# Patient Record
Sex: Female | Born: 1945 | Race: White | Hispanic: No | Marital: Married | Smoking: Never smoker
Health system: Southern US, Community
[De-identification: ages and names within clinical notes are randomized; demographics above are authoritative.]

## PROBLEM LIST (undated history)

## (undated) DIAGNOSIS — I10 Essential (primary) hypertension: Secondary | ICD-10-CM

## (undated) DIAGNOSIS — G4730 Sleep apnea, unspecified: Secondary | ICD-10-CM

## (undated) DIAGNOSIS — K929 Disease of digestive system, unspecified: Secondary | ICD-10-CM

## (undated) DIAGNOSIS — N390 Urinary tract infection, site not specified: Secondary | ICD-10-CM

## (undated) DIAGNOSIS — M1990 Unspecified osteoarthritis, unspecified site: Secondary | ICD-10-CM

## (undated) DIAGNOSIS — D689 Coagulation defect, unspecified: Secondary | ICD-10-CM

## (undated) DIAGNOSIS — K219 Gastro-esophageal reflux disease without esophagitis: Secondary | ICD-10-CM

## (undated) DIAGNOSIS — Z9889 Other specified postprocedural states: Secondary | ICD-10-CM

## (undated) DIAGNOSIS — E079 Disorder of thyroid, unspecified: Secondary | ICD-10-CM

## (undated) DIAGNOSIS — E039 Hypothyroidism, unspecified: Secondary | ICD-10-CM

## (undated) DIAGNOSIS — Z85820 Personal history of malignant melanoma of skin: Secondary | ICD-10-CM

## (undated) DIAGNOSIS — E785 Hyperlipidemia, unspecified: Secondary | ICD-10-CM

## (undated) DIAGNOSIS — J45909 Unspecified asthma, uncomplicated: Secondary | ICD-10-CM

## (undated) HISTORY — DX: Disorder of thyroid, unspecified: E07.9

## (undated) HISTORY — DX: Gastro-esophageal reflux disease without esophagitis: K21.9

## (undated) HISTORY — DX: Disease of digestive system, unspecified: K92.9

## (undated) HISTORY — DX: Hypothyroidism, unspecified: E03.9

## (undated) HISTORY — DX: Coagulation defect, unspecified: D68.9

## (undated) HISTORY — DX: Unspecified asthma, uncomplicated: J45.909

## (undated) HISTORY — DX: Sleep apnea, unspecified: G47.30

## (undated) HISTORY — DX: Hyperlipidemia, unspecified: E78.5

## (undated) HISTORY — DX: Essential (primary) hypertension: I10

## (undated) HISTORY — DX: Urinary tract infection, site not specified: N39.0

## (undated) HISTORY — PX: ABDOMINAL HYSTERECTOMY: SHX81

## (undated) HISTORY — DX: Unspecified osteoarthritis, unspecified site: M19.90

## (undated) HISTORY — DX: Other specified postprocedural states: Z98.890

## (undated) HISTORY — DX: Personal history of malignant melanoma of skin: Z85.820

---

## 2004-07-14 ENCOUNTER — Ambulatory Visit: Payer: Self-pay | Admitting: Internal Medicine

## 2005-07-22 ENCOUNTER — Ambulatory Visit: Payer: Self-pay | Admitting: Internal Medicine

## 2006-01-03 ENCOUNTER — Ambulatory Visit: Payer: Self-pay | Admitting: Internal Medicine

## 2006-07-28 ENCOUNTER — Ambulatory Visit: Payer: Self-pay | Admitting: Internal Medicine

## 2006-10-06 ENCOUNTER — Ambulatory Visit: Payer: Self-pay | Admitting: Family Medicine

## 2007-08-03 ENCOUNTER — Ambulatory Visit: Payer: Self-pay | Admitting: Internal Medicine

## 2008-09-11 ENCOUNTER — Ambulatory Visit: Payer: Self-pay | Admitting: Internal Medicine

## 2009-09-22 ENCOUNTER — Ambulatory Visit: Payer: Self-pay | Admitting: Internal Medicine

## 2010-09-23 ENCOUNTER — Ambulatory Visit: Payer: Self-pay

## 2011-01-26 DIAGNOSIS — I8310 Varicose veins of unspecified lower extremity with inflammation: Secondary | ICD-10-CM | POA: Insufficient documentation

## 2011-01-26 DIAGNOSIS — I8010 Phlebitis and thrombophlebitis of unspecified femoral vein: Secondary | ICD-10-CM | POA: Insufficient documentation

## 2011-02-05 ENCOUNTER — Ambulatory Visit: Payer: Self-pay | Admitting: Internal Medicine

## 2011-09-28 ENCOUNTER — Ambulatory Visit: Payer: Self-pay | Admitting: Internal Medicine

## 2011-09-28 LAB — CREATININE, SERUM
Creatinine: 0.68 mg/dL (ref 0.60–1.30)
EGFR (African American): >60

## 2011-09-28 LAB — BUN: BUN: 19 mg/dL — ABNORMAL HIGH (ref 7–18)

## 2011-10-26 ENCOUNTER — Ambulatory Visit: Payer: Self-pay | Admitting: Internal Medicine

## 2011-11-23 ENCOUNTER — Ambulatory Visit: Payer: Self-pay | Admitting: Internal Medicine

## 2012-01-28 ENCOUNTER — Ambulatory Visit: Payer: Self-pay | Admitting: Unknown Physician Specialty

## 2012-01-31 LAB — PATHOLOGY REPORT

## 2012-10-26 ENCOUNTER — Ambulatory Visit: Payer: Self-pay | Admitting: Internal Medicine

## 2012-12-26 ENCOUNTER — Ambulatory Visit: Payer: Self-pay | Admitting: Internal Medicine

## 2013-02-09 ENCOUNTER — Other Ambulatory Visit: Payer: Self-pay | Admitting: Physician Assistant

## 2013-02-09 LAB — CBC WITH DIFFERENTIAL/PLATELET
Basophil #: 0.1 10*3/uL (ref 0.0–0.1)
Eosinophil %: 1.8 %
HCT: 40.3 % (ref 35.0–47.0)
HGB: 14.2 g/dL (ref 12.0–16.0)
Lymphocyte %: 28 %
MCV: 89 fL (ref 80–100)
Monocyte #: 0.4 x10 3/mm (ref 0.2–0.9)
Monocyte %: 8.8 %
Neutrophil #: 3 10*3/uL (ref 1.4–6.5)
Platelet: 206 10*3/uL (ref 150–440)
RBC: 4.51 10*6/uL (ref 3.80–5.20)
RDW: 13 % (ref 11.5–14.5)

## 2013-02-09 LAB — COMPREHENSIVE METABOLIC PANEL
Alkaline Phosphatase: 133 U/L (ref 50–136)
Anion Gap: 5 — ABNORMAL LOW (ref 7–16)
BUN: 18 mg/dL (ref 7–18)
Chloride: 106 mmol/L (ref 98–107)
Co2: 30 mmol/L (ref 21–32)
Creatinine: 0.75 mg/dL (ref 0.60–1.30)
EGFR (Non-African Amer.): >60
Glucose: 88 mg/dL (ref 65–99)
SGOT(AST): 54 U/L — ABNORMAL HIGH (ref 15–37)
SGPT (ALT): 83 U/L — ABNORMAL HIGH (ref 12–78)
Sodium: 141 mmol/L (ref 136–145)

## 2013-02-09 LAB — LIPID PANEL
Cholesterol: 218 mg/dL — ABNORMAL HIGH (ref 0–200)
HDL Cholesterol: 65 mg/dL — ABNORMAL HIGH (ref 40–60)
Triglycerides: 114 mg/dL (ref 0–200)
VLDL Cholesterol, Calc: 23 mg/dL (ref 5–40)

## 2013-02-09 LAB — TSH: Thyroid Stimulating Horm: 2.24 u[IU]/mL

## 2013-02-26 ENCOUNTER — Ambulatory Visit: Payer: Self-pay | Admitting: Internal Medicine

## 2013-04-26 ENCOUNTER — Ambulatory Visit: Payer: Self-pay | Admitting: Internal Medicine

## 2013-11-20 ENCOUNTER — Ambulatory Visit: Payer: Self-pay | Admitting: Physician Assistant

## 2013-11-20 ENCOUNTER — Ambulatory Visit: Payer: Self-pay | Admitting: Internal Medicine

## 2014-11-27 ENCOUNTER — Ambulatory Visit
Admit: 2014-11-27 | Disposition: A | Discharge status: Home / Self Care | Payer: Self-pay | Attending: Internal Medicine | Admitting: Internal Medicine

## 2015-07-03 ENCOUNTER — Other Ambulatory Visit: Payer: Self-pay | Admitting: Internal Medicine

## 2015-07-07 ENCOUNTER — Other Ambulatory Visit: Payer: Self-pay | Admitting: Internal Medicine

## 2015-07-07 DIAGNOSIS — M810 Age-related osteoporosis without current pathological fracture: Secondary | ICD-10-CM

## 2015-07-07 DIAGNOSIS — E2839 Other primary ovarian failure: Secondary | ICD-10-CM

## 2015-07-17 ENCOUNTER — Ambulatory Visit: Payer: Self-pay | Attending: Internal Medicine

## 2015-08-19 ENCOUNTER — Ambulatory Visit
Admission: RE | Admit: 2015-08-19 | Discharge: 2015-08-19 | Disposition: A | Discharge status: Home / Self Care | Payer: PPO | Source: Ambulatory Visit | Attending: Internal Medicine | Admitting: Internal Medicine

## 2015-08-19 DIAGNOSIS — Z90710 Acquired absence of both cervix and uterus: Secondary | ICD-10-CM | POA: Insufficient documentation

## 2015-08-19 DIAGNOSIS — Z13820 Encounter for screening for osteoporosis: Secondary | ICD-10-CM | POA: Insufficient documentation

## 2015-08-19 DIAGNOSIS — M8580 Other specified disorders of bone density and structure, unspecified site: Secondary | ICD-10-CM | POA: Diagnosis not present

## 2015-08-19 DIAGNOSIS — E2839 Other primary ovarian failure: Secondary | ICD-10-CM

## 2015-08-19 DIAGNOSIS — M810 Age-related osteoporosis without current pathological fracture: Secondary | ICD-10-CM

## 2015-08-22 DIAGNOSIS — Z86718 Personal history of other venous thrombosis and embolism: Secondary | ICD-10-CM | POA: Diagnosis not present

## 2015-10-24 DIAGNOSIS — Z86718 Personal history of other venous thrombosis and embolism: Secondary | ICD-10-CM | POA: Diagnosis not present

## 2015-12-12 DIAGNOSIS — J0100 Acute maxillary sinusitis, unspecified: Secondary | ICD-10-CM | POA: Diagnosis not present

## 2015-12-12 DIAGNOSIS — J45991 Cough variant asthma: Secondary | ICD-10-CM | POA: Diagnosis not present

## 2015-12-23 DIAGNOSIS — Z7901 Long term (current) use of anticoagulants: Secondary | ICD-10-CM | POA: Diagnosis not present

## 2015-12-23 DIAGNOSIS — J019 Acute sinusitis, unspecified: Secondary | ICD-10-CM | POA: Diagnosis not present

## 2015-12-24 DIAGNOSIS — J019 Acute sinusitis, unspecified: Secondary | ICD-10-CM | POA: Diagnosis not present

## 2015-12-25 DIAGNOSIS — J309 Allergic rhinitis, unspecified: Secondary | ICD-10-CM | POA: Diagnosis not present

## 2015-12-25 DIAGNOSIS — Z7901 Long term (current) use of anticoagulants: Secondary | ICD-10-CM | POA: Diagnosis not present

## 2015-12-25 DIAGNOSIS — I80221 Phlebitis and thrombophlebitis of right popliteal vein: Secondary | ICD-10-CM | POA: Diagnosis not present

## 2015-12-25 DIAGNOSIS — J0100 Acute maxillary sinusitis, unspecified: Secondary | ICD-10-CM | POA: Diagnosis not present

## 2016-01-02 ENCOUNTER — Ambulatory Visit
Admission: RE | Admit: 2016-01-02 | Discharge: 2016-01-02 | Disposition: A | Discharge status: Home / Self Care | Payer: PPO | Source: Ambulatory Visit | Attending: Physician Assistant | Admitting: Physician Assistant

## 2016-01-02 ENCOUNTER — Other Ambulatory Visit: Payer: Self-pay | Admitting: Physician Assistant

## 2016-01-02 DIAGNOSIS — R05 Cough: Secondary | ICD-10-CM | POA: Insufficient documentation

## 2016-01-02 DIAGNOSIS — J019 Acute sinusitis, unspecified: Secondary | ICD-10-CM | POA: Diagnosis not present

## 2016-01-02 DIAGNOSIS — R059 Cough, unspecified: Secondary | ICD-10-CM

## 2016-01-06 DIAGNOSIS — J45991 Cough variant asthma: Secondary | ICD-10-CM | POA: Diagnosis not present

## 2016-01-06 DIAGNOSIS — J329 Chronic sinusitis, unspecified: Secondary | ICD-10-CM | POA: Diagnosis not present

## 2016-01-15 DIAGNOSIS — N390 Urinary tract infection, site not specified: Secondary | ICD-10-CM | POA: Diagnosis not present

## 2016-01-15 DIAGNOSIS — Z86718 Personal history of other venous thrombosis and embolism: Secondary | ICD-10-CM | POA: Diagnosis not present

## 2016-01-15 DIAGNOSIS — R1030 Lower abdominal pain, unspecified: Secondary | ICD-10-CM | POA: Diagnosis not present

## 2016-01-15 DIAGNOSIS — Z7901 Long term (current) use of anticoagulants: Secondary | ICD-10-CM | POA: Diagnosis not present

## 2016-01-15 DIAGNOSIS — R140 Abdominal distension (gaseous): Secondary | ICD-10-CM | POA: Diagnosis not present

## 2016-01-23 DIAGNOSIS — J323 Chronic sphenoidal sinusitis: Secondary | ICD-10-CM | POA: Diagnosis not present

## 2016-01-23 DIAGNOSIS — J301 Allergic rhinitis due to pollen: Secondary | ICD-10-CM | POA: Diagnosis not present

## 2016-01-23 DIAGNOSIS — J324 Chronic pansinusitis: Secondary | ICD-10-CM | POA: Diagnosis not present

## 2016-01-27 DIAGNOSIS — Z7901 Long term (current) use of anticoagulants: Secondary | ICD-10-CM | POA: Diagnosis not present

## 2016-01-27 DIAGNOSIS — N390 Urinary tract infection, site not specified: Secondary | ICD-10-CM | POA: Diagnosis not present

## 2016-01-27 DIAGNOSIS — I80221 Phlebitis and thrombophlebitis of right popliteal vein: Secondary | ICD-10-CM | POA: Diagnosis not present

## 2016-02-03 DIAGNOSIS — J329 Chronic sinusitis, unspecified: Secondary | ICD-10-CM | POA: Insufficient documentation

## 2016-02-03 DIAGNOSIS — J40 Bronchitis, not specified as acute or chronic: Secondary | ICD-10-CM

## 2016-05-12 DIAGNOSIS — Z86718 Personal history of other venous thrombosis and embolism: Secondary | ICD-10-CM | POA: Diagnosis not present

## 2016-05-12 DIAGNOSIS — N390 Urinary tract infection, site not specified: Secondary | ICD-10-CM | POA: Diagnosis not present

## 2016-05-12 DIAGNOSIS — Z7901 Long term (current) use of anticoagulants: Secondary | ICD-10-CM | POA: Diagnosis not present

## 2016-05-12 DIAGNOSIS — N952 Postmenopausal atrophic vaginitis: Secondary | ICD-10-CM | POA: Diagnosis not present

## 2016-05-14 DIAGNOSIS — E559 Vitamin D deficiency, unspecified: Secondary | ICD-10-CM | POA: Diagnosis not present

## 2016-05-14 DIAGNOSIS — E782 Mixed hyperlipidemia: Secondary | ICD-10-CM | POA: Diagnosis not present

## 2016-05-14 DIAGNOSIS — Z0001 Encounter for general adult medical examination with abnormal findings: Secondary | ICD-10-CM | POA: Diagnosis not present

## 2016-05-18 DIAGNOSIS — Z7901 Long term (current) use of anticoagulants: Secondary | ICD-10-CM | POA: Diagnosis not present

## 2016-05-18 DIAGNOSIS — Z86718 Personal history of other venous thrombosis and embolism: Secondary | ICD-10-CM | POA: Diagnosis not present

## 2016-05-31 DIAGNOSIS — N390 Urinary tract infection, site not specified: Secondary | ICD-10-CM | POA: Diagnosis not present

## 2016-05-31 DIAGNOSIS — Z23 Encounter for immunization: Secondary | ICD-10-CM | POA: Diagnosis not present

## 2016-05-31 DIAGNOSIS — Z7901 Long term (current) use of anticoagulants: Secondary | ICD-10-CM | POA: Diagnosis not present

## 2016-05-31 DIAGNOSIS — E782 Mixed hyperlipidemia: Secondary | ICD-10-CM | POA: Diagnosis not present

## 2016-05-31 DIAGNOSIS — Z86718 Personal history of other venous thrombosis and embolism: Secondary | ICD-10-CM | POA: Diagnosis not present

## 2016-06-14 DIAGNOSIS — Z86718 Personal history of other venous thrombosis and embolism: Secondary | ICD-10-CM | POA: Diagnosis not present

## 2016-06-14 DIAGNOSIS — Z7901 Long term (current) use of anticoagulants: Secondary | ICD-10-CM | POA: Diagnosis not present

## 2016-06-22 DIAGNOSIS — N390 Urinary tract infection, site not specified: Secondary | ICD-10-CM | POA: Diagnosis not present

## 2016-06-22 DIAGNOSIS — Z7901 Long term (current) use of anticoagulants: Secondary | ICD-10-CM | POA: Diagnosis not present

## 2016-07-01 ENCOUNTER — Ambulatory Visit: Payer: PPO | Admitting: Urology

## 2016-07-01 ENCOUNTER — Ambulatory Visit
Admission: RE | Admit: 2016-07-01 | Discharge: 2016-07-01 | Disposition: A | Discharge status: Home / Self Care | Payer: PPO | Source: Ambulatory Visit | Attending: Urology | Admitting: Urology

## 2016-07-01 ENCOUNTER — Encounter: Payer: Self-pay | Admitting: Urology

## 2016-07-01 ENCOUNTER — Telehealth: Payer: Self-pay

## 2016-07-01 VITALS — BP 118/80 | HR 61 | Ht 62.5 in | Wt 151.4 lb

## 2016-07-01 DIAGNOSIS — N390 Urinary tract infection, site not specified: Secondary | ICD-10-CM | POA: Insufficient documentation

## 2016-07-01 NOTE — Telephone Encounter (Signed)
Patient notified/SW 

## 2016-07-01 NOTE — Telephone Encounter (Signed)
-----   Message from Hollice Espy, MD sent at 07/01/2016  4:10 PM EST ----- No stones, great news.    Hollice Espy, MD

## 2016-07-01 NOTE — Progress Notes (Signed)
07/01/2016 7:49 PM   Marissa Wilson [birth year] [id number]  Referring provider: Lavera Guise, MD [address] [place], [place] [address]  Chief Complaint  Patient presents with  . New Patient (Initial Visit)    Recurrent UTI    HPI: 70 year old female referred today by Dr. Humphrey Rolls for further evaluation of recurrent Urinary tract infections.  Urine culture data reveals positive urine culture on 05/2016 growing Klebsiella and E.coli resistant to ampicillin, piperacillin, and bactrim.  UCx 04/2016 grew E. Coli with similar resistance pattern.  UCx 01/2016 grew low colony count Enterococcus/ Klebsiella 10-25K.    She usually has about 1-2 per year prior to this.    She just started on compounded topical estrogen cream.    No associated fevers or flank pain.    Personal history of C. Diff with antibiotics.  She also takes a good amount amount  abx for chronic sinusitis.   She does use prototic.    She is a personal history of DVT on Coumadin.  Her first blood clot was at clot at age 30.  She feels that these are related estrogen.  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    Post menopausal.  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  She does has vaginal dryness without dysparunia.  No association with with sexual intercourse.  She does not duche.  She does not also wipe from front to back.    No issues with constipation.    No personal history of kidney stones.    No urinary urgency frequency or incontinence.     PMH: Past Medical History:  Diagnosis Date  . Asthma   . GERD (gastroesophageal reflux disease)   . Thyroid disease     Surgical History: Past Surgical History:  Procedure Laterality Date  . ABDOMINAL HYSTERECTOMY      Home Medications:    Medication List       Accurate as of 07/01/16 11:59 PM. Always use your most recent med list.          levothyroxine 75 MCG tablet Commonly known as:  SYNTHROID, LEVOTHROID Take by mouth.     montelukast 10 MG tablet Commonly known as:  SINGULAIR   omeprazole 40 MG capsule Commonly known as:  PRILOSEC Take 40 mg by mouth.   triamterene-hydrochlorothiazide 37.5-25 MG tablet Commonly known as:  MAXZIDE-25 Take by mouth.   Vitamin D3 1000 units Caps Take by mouth.   warfarin 5 MG tablet Commonly known as:  COUMADIN 5 mg.       Allergies:  Allergies  Allergen Reactions  . Ampicillin Diarrhea  . Cefazolin Diarrhea    Family History: Family History  Problem Relation Age of Onset  . Bladder Cancer Neg Hx   . Kidney cancer Neg Hx     Social History:  reports that she has never smoked. She has never used smokeless tobacco. She reports that she drinks alcohol. She reports that she does not use drugs.  ROS: UROLOGY Frequent Urination?: No Hard to postpone urination?: No Burning/pain with urination?: Yes Get up at night to urinate?: Yes Leakage of urine?: No Urine stream starts and stops?: No Trouble starting stream?: No Do you have to strain to urinate?: No Blood in urine?: No Urinary tract infection?: Yes Sexually transmitted disease?: No Injury to kidneys or bladder?: No Painful intercourse?: No Weak stream?: No Currently pregnant?: No Vaginal bleeding?: No Last menstrual period?: n  Gastrointestinal Nausea?: No Vomiting?: No Indigestion/heartburn?: Yes  Diarrhea?: No Constipation?: No  Constitutional Fever: No Night sweats?: No Weight loss?: No Fatigue?: No  Skin Skin rash/lesions?: No Itching?: No  Eyes Blurred vision?: No Double vision?: No  Ears/Nose/Throat Sore throat?: No Sinus problems?: Yes  Hematologic/Lymphatic Swollen glands?: No Easy bruising?: No  Cardiovascular Leg swelling?: Yes Chest pain?: No  Respiratory Cough?: No Shortness of breath?: No  Endocrine Excessive thirst?: No  Musculoskeletal Back pain?: No Joint pain?: No  Neurological Headaches?: No Dizziness?: No  Psychologic Depression?:  No Anxiety?: No  Physical Exam: BP 118/80 (BP Location: Left Arm, Patient Position: Sitting, Cuff Size: Normal)   Pulse 61   Ht 5' 2.5" (1.588 m)   Wt 151 lb 6.4 oz (68.7 kg)   BMI 27.25 kg/m   Constitutional:  Alert and oriented, No acute distress. HEENT: [place] AT, moist mucus membranes.  Trachea midline, no masses. Cardiovascular: No clubbing, cyanosis, or edema. Respiratory: Normal respiratory effort, no increased work of breathing. GI: Abdomen is soft, nontender, nondistended, no abdominal masses GU: No CVA tenderness.  Skin: No rashes, bruises or suspicious lesions. Neurologic: Grossly intact, no focal deficits, moving all 4 extremities. Psychiatric: Normal mood and affect.  Laboratory Data: Lab Results  Component Value Date   WBC 5.1 02/09/2013   HGB 14.2 02/09/2013   HCT 40.3 02/09/2013   MCV 89 02/09/2013   PLT 206 02/09/2013    Lab Results  Component Value Date   CREATININE 0.75 02/09/2013    Urinalysis  Currently asymptomatic, UA defered  Pertinent Imaging: CLINICAL DATA:  Recurrent UTI  EXAM: ABDOMEN - 1 VIEW  COMPARISON:  CT abdomen pelvis 09/28/2011  FINDINGS: The bowel gas pattern is normal. No radio-opaque calculi or other significant radiographic abnormality are seen.  IMPRESSION: Negative.   Electronically Signed   By: Franchot Gallo M.D.   On: 07/01/2016 14:36    KUB personally reviewed today  Assessment & Plan:    1. Recurrent UTI No clear etiology for infection, KUB negative for stones Following recs made: Cranberry tablets twice per day Probiotic or yogurt with live active culture daily Topical estogen cream M-W-Fr before bed, pea sized amount on the urethra.  Discussed minimal risk of systemic absorption although there is up to 5%, much lower than HRT.    Plenty of water Hygiene issues discussed Advised to follow up in our clinic when symptomatic in order to collect UCx data and frequency of infection Prefer to avoid  suppressive abx in setting of hx C. Diff and antibiotic stewardship No indication for cysto at this point but may consider in future  All questions answered  - DG Abd 1 View; Future    F/u prn  Hollice Espy, MD  [facility] [address] [place], [place] [address] [phone]

## 2016-07-01 NOTE — Patient Instructions (Addendum)
Cranberry tablets twice per day  Probiotic or yogurt with live active culture daily  Topical estogen cream M-W-Fr before bed, pea sized amount on the urethra   Plenty of water  Hygiene issues discussed

## 2016-08-02 ENCOUNTER — Other Ambulatory Visit: Payer: Self-pay | Admitting: Internal Medicine

## 2016-08-02 DIAGNOSIS — Z1231 Encounter for screening mammogram for malignant neoplasm of breast: Secondary | ICD-10-CM

## 2016-08-02 DIAGNOSIS — I8311 Varicose veins of right lower extremity with inflammation: Secondary | ICD-10-CM | POA: Diagnosis not present

## 2016-08-02 DIAGNOSIS — Z7901 Long term (current) use of anticoagulants: Secondary | ICD-10-CM | POA: Diagnosis not present

## 2016-08-02 DIAGNOSIS — E782 Mixed hyperlipidemia: Secondary | ICD-10-CM | POA: Diagnosis not present

## 2016-08-02 DIAGNOSIS — I10 Essential (primary) hypertension: Secondary | ICD-10-CM | POA: Diagnosis not present

## 2016-08-02 DIAGNOSIS — E039 Hypothyroidism, unspecified: Secondary | ICD-10-CM | POA: Diagnosis not present

## 2016-08-02 DIAGNOSIS — I80221 Phlebitis and thrombophlebitis of right popliteal vein: Secondary | ICD-10-CM | POA: Diagnosis not present

## 2016-08-02 DIAGNOSIS — Z0001 Encounter for general adult medical examination with abnormal findings: Secondary | ICD-10-CM | POA: Diagnosis not present

## 2016-09-14 ENCOUNTER — Ambulatory Visit: Payer: PPO

## 2016-10-13 DIAGNOSIS — Z7901 Long term (current) use of anticoagulants: Secondary | ICD-10-CM | POA: Diagnosis not present

## 2016-10-18 ENCOUNTER — Ambulatory Visit
Admission: RE | Admit: 2016-10-18 | Discharge: 2016-10-18 | Disposition: A | Discharge status: Home / Self Care | Payer: PPO | Source: Ambulatory Visit | Attending: Internal Medicine | Admitting: Internal Medicine

## 2016-10-18 DIAGNOSIS — Z1231 Encounter for screening mammogram for malignant neoplasm of breast: Secondary | ICD-10-CM

## 2016-10-21 ENCOUNTER — Other Ambulatory Visit: Payer: Self-pay | Admitting: Internal Medicine

## 2016-10-21 ENCOUNTER — Ambulatory Visit: Payer: PPO

## 2016-10-21 DIAGNOSIS — R928 Other abnormal and inconclusive findings on diagnostic imaging of breast: Secondary | ICD-10-CM

## 2016-10-25 DIAGNOSIS — R1030 Lower abdominal pain, unspecified: Secondary | ICD-10-CM | POA: Diagnosis not present

## 2016-10-25 DIAGNOSIS — Z86718 Personal history of other venous thrombosis and embolism: Secondary | ICD-10-CM | POA: Diagnosis not present

## 2016-10-25 DIAGNOSIS — R140 Abdominal distension (gaseous): Secondary | ICD-10-CM | POA: Diagnosis not present

## 2016-10-25 DIAGNOSIS — N390 Urinary tract infection, site not specified: Secondary | ICD-10-CM | POA: Diagnosis not present

## 2016-10-25 DIAGNOSIS — Z7901 Long term (current) use of anticoagulants: Secondary | ICD-10-CM | POA: Diagnosis not present

## 2016-10-28 ENCOUNTER — Ambulatory Visit
Admission: RE | Admit: 2016-10-28 | Discharge: 2016-10-28 | Disposition: A | Discharge status: Home / Self Care | Payer: PPO | Source: Ambulatory Visit | Attending: Internal Medicine | Admitting: Internal Medicine

## 2016-10-28 DIAGNOSIS — R928 Other abnormal and inconclusive findings on diagnostic imaging of breast: Secondary | ICD-10-CM

## 2016-10-28 DIAGNOSIS — N6489 Other specified disorders of breast: Secondary | ICD-10-CM | POA: Diagnosis not present

## 2016-11-01 ENCOUNTER — Other Ambulatory Visit: Payer: Self-pay | Admitting: Internal Medicine

## 2016-11-01 ENCOUNTER — Ambulatory Visit
Admission: RE | Admit: 2016-11-01 | Discharge: 2016-11-01 | Disposition: A | Discharge status: Home / Self Care | Payer: PPO | Source: Ambulatory Visit | Attending: Internal Medicine | Admitting: Internal Medicine

## 2016-11-01 DIAGNOSIS — M79604 Pain in right leg: Secondary | ICD-10-CM | POA: Diagnosis not present

## 2016-11-01 DIAGNOSIS — M79605 Pain in left leg: Secondary | ICD-10-CM | POA: Diagnosis not present

## 2016-11-01 DIAGNOSIS — Z7901 Long term (current) use of anticoagulants: Secondary | ICD-10-CM | POA: Diagnosis not present

## 2016-11-01 DIAGNOSIS — I82811 Embolism and thrombosis of superficial veins of right lower extremities: Secondary | ICD-10-CM | POA: Insufficient documentation

## 2016-11-03 DIAGNOSIS — Z7901 Long term (current) use of anticoagulants: Secondary | ICD-10-CM | POA: Diagnosis not present

## 2016-11-04 DIAGNOSIS — B372 Candidiasis of skin and nail: Secondary | ICD-10-CM | POA: Diagnosis not present

## 2016-11-04 DIAGNOSIS — E039 Hypothyroidism, unspecified: Secondary | ICD-10-CM | POA: Diagnosis not present

## 2016-11-04 DIAGNOSIS — I8311 Varicose veins of right lower extremity with inflammation: Secondary | ICD-10-CM | POA: Diagnosis not present

## 2016-11-04 DIAGNOSIS — Z86718 Personal history of other venous thrombosis and embolism: Secondary | ICD-10-CM | POA: Diagnosis not present

## 2016-11-04 DIAGNOSIS — I80221 Phlebitis and thrombophlebitis of right popliteal vein: Secondary | ICD-10-CM | POA: Diagnosis not present

## 2016-11-04 DIAGNOSIS — R1030 Lower abdominal pain, unspecified: Secondary | ICD-10-CM | POA: Diagnosis not present

## 2016-11-09 DIAGNOSIS — I872 Venous insufficiency (chronic) (peripheral): Secondary | ICD-10-CM | POA: Diagnosis not present

## 2016-11-09 DIAGNOSIS — I8310 Varicose veins of unspecified lower extremity with inflammation: Secondary | ICD-10-CM | POA: Diagnosis not present

## 2016-11-09 DIAGNOSIS — I8010 Phlebitis and thrombophlebitis of unspecified femoral vein: Secondary | ICD-10-CM | POA: Diagnosis not present

## 2016-11-19 DIAGNOSIS — Z7901 Long term (current) use of anticoagulants: Secondary | ICD-10-CM | POA: Diagnosis not present

## 2016-11-22 DIAGNOSIS — I809 Phlebitis and thrombophlebitis of unspecified site: Secondary | ICD-10-CM | POA: Diagnosis not present

## 2016-11-29 DIAGNOSIS — Z7901 Long term (current) use of anticoagulants: Secondary | ICD-10-CM | POA: Diagnosis not present

## 2016-11-29 DIAGNOSIS — H25813 Combined forms of age-related cataract, bilateral: Secondary | ICD-10-CM | POA: Diagnosis not present

## 2016-12-20 DIAGNOSIS — Z7901 Long term (current) use of anticoagulants: Secondary | ICD-10-CM | POA: Diagnosis not present

## 2017-01-17 DIAGNOSIS — Z7901 Long term (current) use of anticoagulants: Secondary | ICD-10-CM | POA: Diagnosis not present

## 2017-01-27 DIAGNOSIS — I809 Phlebitis and thrombophlebitis of unspecified site: Secondary | ICD-10-CM | POA: Diagnosis not present

## 2017-01-27 DIAGNOSIS — I10 Essential (primary) hypertension: Secondary | ICD-10-CM | POA: Diagnosis not present

## 2017-01-27 DIAGNOSIS — Z86718 Personal history of other venous thrombosis and embolism: Secondary | ICD-10-CM | POA: Diagnosis not present

## 2017-01-27 DIAGNOSIS — Z881 Allergy status to other antibiotic agents status: Secondary | ICD-10-CM | POA: Diagnosis not present

## 2017-01-27 DIAGNOSIS — Z880 Allergy status to penicillin: Secondary | ICD-10-CM | POA: Diagnosis not present

## 2017-01-27 DIAGNOSIS — K219 Gastro-esophageal reflux disease without esophagitis: Secondary | ICD-10-CM | POA: Diagnosis not present

## 2017-01-27 DIAGNOSIS — Z90710 Acquired absence of both cervix and uterus: Secondary | ICD-10-CM | POA: Diagnosis not present

## 2017-01-27 DIAGNOSIS — Z7901 Long term (current) use of anticoagulants: Secondary | ICD-10-CM | POA: Diagnosis not present

## 2017-01-27 DIAGNOSIS — I8000 Phlebitis and thrombophlebitis of superficial vessels of unspecified lower extremity: Secondary | ICD-10-CM | POA: Diagnosis not present

## 2017-01-27 DIAGNOSIS — I8393 Asymptomatic varicose veins of bilateral lower extremities: Secondary | ICD-10-CM | POA: Diagnosis not present

## 2017-01-27 DIAGNOSIS — Z79899 Other long term (current) drug therapy: Secondary | ICD-10-CM | POA: Diagnosis not present

## 2017-01-27 DIAGNOSIS — E039 Hypothyroidism, unspecified: Secondary | ICD-10-CM | POA: Diagnosis not present

## 2017-02-03 DIAGNOSIS — I10 Essential (primary) hypertension: Secondary | ICD-10-CM | POA: Diagnosis not present

## 2017-02-03 DIAGNOSIS — E782 Mixed hyperlipidemia: Secondary | ICD-10-CM | POA: Diagnosis not present

## 2017-02-03 DIAGNOSIS — K219 Gastro-esophageal reflux disease without esophagitis: Secondary | ICD-10-CM | POA: Diagnosis not present

## 2017-02-03 DIAGNOSIS — E039 Hypothyroidism, unspecified: Secondary | ICD-10-CM | POA: Diagnosis not present

## 2017-02-03 DIAGNOSIS — Z86718 Personal history of other venous thrombosis and embolism: Secondary | ICD-10-CM | POA: Diagnosis not present

## 2017-02-03 DIAGNOSIS — I739 Peripheral vascular disease, unspecified: Secondary | ICD-10-CM | POA: Diagnosis not present

## 2017-02-25 DIAGNOSIS — E039 Hypothyroidism, unspecified: Secondary | ICD-10-CM | POA: Diagnosis not present

## 2017-02-25 DIAGNOSIS — R390 Extravasation of urine: Secondary | ICD-10-CM | POA: Diagnosis not present

## 2017-02-25 DIAGNOSIS — N390 Urinary tract infection, site not specified: Secondary | ICD-10-CM | POA: Diagnosis not present

## 2017-02-25 DIAGNOSIS — K219 Gastro-esophageal reflux disease without esophagitis: Secondary | ICD-10-CM | POA: Diagnosis not present

## 2017-02-25 DIAGNOSIS — I80292 Phlebitis and thrombophlebitis of other deep vessels of left lower extremity: Secondary | ICD-10-CM | POA: Diagnosis not present

## 2017-03-29 ENCOUNTER — Encounter: Payer: Self-pay | Admitting: Gastroenterology

## 2017-03-29 ENCOUNTER — Ambulatory Visit (INDEPENDENT_AMBULATORY_CARE_PROVIDER_SITE_OTHER): Payer: PPO | Admitting: Gastroenterology

## 2017-03-29 VITALS — BP 120/70 | HR 80 | Temp 97.9°F | Ht 62.5 in | Wt 156.6 lb

## 2017-03-29 DIAGNOSIS — K219 Gastro-esophageal reflux disease without esophagitis: Secondary | ICD-10-CM

## 2017-03-29 NOTE — Progress Notes (Signed)
Gastroenterology Consultation  Referring Provider:     Lavera Guise, MD Primary Care Physician:  Lavera Guise, MD Primary Gastroenterologist:  Dr. Allen Norris     Reason for Consultation:     Heartburn        HPI:   Marissa Wilson is a 71 y.o. y/o female referred for consultation & management of Heartburn by Dr. Humphrey Rolls, Timoteo Gaul, MD.  This patient comes here today with a history of heartburn.  The patient states she has had heartburn for prostate 5 years.  The patient states that her heartburn is well controlled with her omeprazole 40 mg.  The patient denies any dysphagia or unexplained weight loss.  There is no report of any food getting stuck when she tries to eat.  She does report that she has acid breakthrough approxi-once every 3 months and she can attributed to things she eats even every time.  She states that it is worse with spicy foods and alcohol.  She will take a Tums when she has no symptoms.  She states that it works in relieving her symptoms.  There is no history of colon cancer or colon polyps in the patient had a colonoscopy back in 2013 that she reports her been normal.  Past Medical History:  Diagnosis Date  . Asthma   . GERD (gastroesophageal reflux disease)   . Thyroid disease     Past Surgical History:  Procedure Laterality Date  . ABDOMINAL HYSTERECTOMY      Prior to Admission medications   Medication Sig Start Date End Date Taking? Authorizing Provider  Cholecalciferol (VITAMIN D3) 1000 units CAPS Take by mouth.    [provider]  levothyroxine (SYNTHROID, LEVOTHROID) 75 MCG tablet Take by mouth. 01/26/11   [provider]  montelukast (SINGULAIR) 10 MG tablet  06/14/16   [provider]  omeprazole (PRILOSEC) 40 MG capsule Take 40 mg by mouth. 10/03/14   [provider]  triamterene-hydrochlorothiazide (MAXZIDE-25) 37.5-25 MG tablet Take by mouth. 01/26/11   [provider]  XARELTO 10 MG TABS tablet  03/26/17   [provider]    Family History  Problem Relation Age of Onset  . Breast cancer Mother 4  . Bladder Cancer Neg Hx   . Kidney cancer Neg Hx      Social History  Substance Use Topics  . Smoking status: Never Smoker  . Smokeless tobacco: Never Used  . Alcohol use Yes    Allergies as of 03/29/2017 - Review Complete 07/06/2016  Allergen Reaction Noted  . Amoxicillin  01/27/2017  . Ampicillin Diarrhea 10/14/2014  . Cefazolin Diarrhea 06/11/2011  . Penicillins  06/11/2011    Review of Systems:    All systems reviewed and negative except where noted in HPI.   Physical Exam:  BP 120/70   Pulse 80   Temp 97.9 F (36.6 C) (Oral)   Ht 5' 2.5" (1.588 m)   Wt 156 lb 9.6 oz (71 kg)   BMI 28.19 kg/m  No LMP recorded. Patient has had a hysterectomy. Psych:  Alert and cooperative. Normal mood and affect. General:   Alert,  Well-developed, well-nourished, pleasant and cooperative in NAD Head:  Normocephalic and atraumatic. Eyes:  Sclera clear, no icterus.   Conjunctiva pink. Ears:  Normal auditory acuity. Nose:  No deformity, discharge, or lesions. Mouth:  No deformity or lesions,oropharynx pink & moist. Neck:  Supple; no masses or thyromegaly. Lungs:  Respirations even and unlabored.  Clear throughout  to auscultation.   No wheezes, crackles, or rhonchi. No acute distress. Heart:  Regular rate and rhythm; no murmurs, clicks, rubs, or gallops. Abdomen:  Normal bowel sounds.  No bruits.  Soft, non-tender and non-distended without masses, hepatosplenomegaly or hernias noted.  No guarding or rebound tenderness.  Negative Carnett sign.   Rectal:  Deferred.  Msk:  Symmetrical without gross deformities.  Good, equal movement & strength bilaterally. Pulses:  Normal pulses noted. Extremities:  No clubbing or edema.  No cyanosis. Neurologic:  Alert and oriented x3;  grossly normal neurologically. Skin:  Intact without significant lesions or rashes.  No jaundice. Lymph Nodes:  No  significant cervical adenopathy. Psych:  Alert and cooperative. Normal mood and affect.  Imaging Studies: No results found.  Assessment and Plan:   Marissa Wilson is a 71 y.o. y/o female who comes here today with a history of heartburn with good response to her present medication of omeprazole 40 mg a day.  The patient has been told she can try to decrease to 20 mg a day and see if her symptoms are still under control.  If they are she has been told to contact us to get a prescription for 20 mg of omeprazole every day.  If he does not control her symptoms she should stay on the omeprazole 40 mg.  The patient has no worrisome symptoms nor she having significant acid breakthrough to require her to undergo any endoscopic procedure.  The patient has been explained the plan and will follow up as necessary.  Lucilla Lame, MD. [name]   Note: This dictation was prepared with Dragon dictation along with smaller phrase technology. Any transcriptional errors that result from this process are unintentional.

## 2017-04-04 DIAGNOSIS — Z7901 Long term (current) use of anticoagulants: Secondary | ICD-10-CM | POA: Diagnosis not present

## 2017-04-04 DIAGNOSIS — I8312 Varicose veins of left lower extremity with inflammation: Secondary | ICD-10-CM | POA: Diagnosis not present

## 2017-04-04 DIAGNOSIS — I809 Phlebitis and thrombophlebitis of unspecified site: Secondary | ICD-10-CM | POA: Diagnosis not present

## 2017-04-04 DIAGNOSIS — I8311 Varicose veins of right lower extremity with inflammation: Secondary | ICD-10-CM | POA: Diagnosis not present

## 2017-04-04 DIAGNOSIS — I83813 Varicose veins of bilateral lower extremities with pain: Secondary | ICD-10-CM | POA: Diagnosis not present

## 2017-04-04 DIAGNOSIS — I8310 Varicose veins of unspecified lower extremity with inflammation: Secondary | ICD-10-CM | POA: Diagnosis not present

## 2017-04-04 DIAGNOSIS — Z86718 Personal history of other venous thrombosis and embolism: Secondary | ICD-10-CM | POA: Diagnosis not present

## 2017-05-02 ENCOUNTER — Encounter: Payer: Self-pay | Admitting: Urology

## 2017-06-06 DIAGNOSIS — E039 Hypothyroidism, unspecified: Secondary | ICD-10-CM | POA: Diagnosis not present

## 2017-06-06 DIAGNOSIS — Z86718 Personal history of other venous thrombosis and embolism: Secondary | ICD-10-CM | POA: Diagnosis not present

## 2017-06-06 DIAGNOSIS — I8311 Varicose veins of right lower extremity with inflammation: Secondary | ICD-10-CM | POA: Diagnosis not present

## 2017-06-06 DIAGNOSIS — Z880 Allergy status to penicillin: Secondary | ICD-10-CM | POA: Diagnosis not present

## 2017-06-06 DIAGNOSIS — Z01818 Encounter for other preprocedural examination: Secondary | ICD-10-CM | POA: Diagnosis not present

## 2017-06-06 DIAGNOSIS — I10 Essential (primary) hypertension: Secondary | ICD-10-CM | POA: Diagnosis not present

## 2017-06-09 DIAGNOSIS — I10 Essential (primary) hypertension: Secondary | ICD-10-CM | POA: Diagnosis not present

## 2017-06-09 DIAGNOSIS — Z886 Allergy status to analgesic agent status: Secondary | ICD-10-CM | POA: Diagnosis not present

## 2017-06-09 DIAGNOSIS — Z7901 Long term (current) use of anticoagulants: Secondary | ICD-10-CM | POA: Diagnosis not present

## 2017-06-09 DIAGNOSIS — Z880 Allergy status to penicillin: Secondary | ICD-10-CM | POA: Diagnosis not present

## 2017-06-09 DIAGNOSIS — I83891 Varicose veins of right lower extremities with other complications: Secondary | ICD-10-CM | POA: Diagnosis not present

## 2017-06-09 DIAGNOSIS — I872 Venous insufficiency (chronic) (peripheral): Secondary | ICD-10-CM | POA: Diagnosis not present

## 2017-06-09 DIAGNOSIS — E039 Hypothyroidism, unspecified: Secondary | ICD-10-CM | POA: Diagnosis not present

## 2017-06-09 DIAGNOSIS — K219 Gastro-esophageal reflux disease without esophagitis: Secondary | ICD-10-CM | POA: Diagnosis not present

## 2017-06-09 DIAGNOSIS — I8003 Phlebitis and thrombophlebitis of superficial vessels of lower extremities, bilateral: Secondary | ICD-10-CM | POA: Diagnosis not present

## 2017-06-09 DIAGNOSIS — Z79899 Other long term (current) drug therapy: Secondary | ICD-10-CM | POA: Diagnosis not present

## 2017-06-09 DIAGNOSIS — J309 Allergic rhinitis, unspecified: Secondary | ICD-10-CM | POA: Diagnosis not present

## 2017-06-09 DIAGNOSIS — I8311 Varicose veins of right lower extremity with inflammation: Secondary | ICD-10-CM | POA: Diagnosis not present

## 2017-06-09 HISTORY — PX: OTHER SURGICAL HISTORY: SHX169

## 2017-06-14 DIAGNOSIS — R269 Unspecified abnormalities of gait and mobility: Secondary | ICD-10-CM | POA: Diagnosis not present

## 2017-06-14 DIAGNOSIS — Z7901 Long term (current) use of anticoagulants: Secondary | ICD-10-CM | POA: Diagnosis not present

## 2017-06-14 DIAGNOSIS — M79661 Pain in right lower leg: Secondary | ICD-10-CM | POA: Diagnosis not present

## 2017-06-14 DIAGNOSIS — Z86718 Personal history of other venous thrombosis and embolism: Secondary | ICD-10-CM | POA: Diagnosis not present

## 2017-06-14 DIAGNOSIS — I871 Compression of vein: Secondary | ICD-10-CM | POA: Diagnosis not present

## 2017-06-14 DIAGNOSIS — I878 Other specified disorders of veins: Secondary | ICD-10-CM | POA: Diagnosis not present

## 2017-06-14 DIAGNOSIS — L7631 Postprocedural hematoma of skin and subcutaneous tissue following a dermatologic procedure: Secondary | ICD-10-CM | POA: Diagnosis not present

## 2017-06-14 DIAGNOSIS — I10 Essential (primary) hypertension: Secondary | ICD-10-CM | POA: Diagnosis not present

## 2017-06-14 DIAGNOSIS — I82431 Acute embolism and thrombosis of right popliteal vein: Secondary | ICD-10-CM | POA: Diagnosis not present

## 2017-06-14 DIAGNOSIS — K219 Gastro-esophageal reflux disease without esophagitis: Secondary | ICD-10-CM | POA: Diagnosis not present

## 2017-06-14 DIAGNOSIS — I82811 Embolism and thrombosis of superficial veins of right lower extremities: Secondary | ICD-10-CM | POA: Diagnosis not present

## 2017-06-14 DIAGNOSIS — E039 Hypothyroidism, unspecified: Secondary | ICD-10-CM | POA: Diagnosis not present

## 2017-06-16 DIAGNOSIS — G8918 Other acute postprocedural pain: Secondary | ICD-10-CM | POA: Diagnosis not present

## 2017-06-16 DIAGNOSIS — M79604 Pain in right leg: Secondary | ICD-10-CM | POA: Diagnosis not present

## 2017-06-16 DIAGNOSIS — M79661 Pain in right lower leg: Secondary | ICD-10-CM | POA: Diagnosis not present

## 2017-06-17 DIAGNOSIS — I82431 Acute embolism and thrombosis of right popliteal vein: Secondary | ICD-10-CM | POA: Diagnosis not present

## 2017-06-17 DIAGNOSIS — G8918 Other acute postprocedural pain: Secondary | ICD-10-CM | POA: Diagnosis not present

## 2017-06-17 DIAGNOSIS — M79604 Pain in right leg: Secondary | ICD-10-CM | POA: Diagnosis not present

## 2017-06-17 DIAGNOSIS — I824Z1 Acute embolism and thrombosis of unspecified deep veins of right distal lower extremity: Secondary | ICD-10-CM | POA: Diagnosis not present

## 2017-06-20 ENCOUNTER — Encounter
Admission: RE | Admit: 2017-06-20 | Discharge: 2017-06-20 | Disposition: A | Discharge status: Home / Self Care | Payer: PPO | Source: Ambulatory Visit | Attending: Internal Medicine | Admitting: Internal Medicine

## 2017-06-20 DIAGNOSIS — K219 Gastro-esophageal reflux disease without esophagitis: Secondary | ICD-10-CM | POA: Diagnosis not present

## 2017-06-20 DIAGNOSIS — I82441 Acute embolism and thrombosis of right tibial vein: Secondary | ICD-10-CM | POA: Diagnosis not present

## 2017-06-20 DIAGNOSIS — M7989 Other specified soft tissue disorders: Secondary | ICD-10-CM | POA: Diagnosis not present

## 2017-06-20 DIAGNOSIS — I878 Other specified disorders of veins: Secondary | ICD-10-CM | POA: Diagnosis not present

## 2017-06-20 DIAGNOSIS — M6281 Muscle weakness (generalized): Secondary | ICD-10-CM | POA: Diagnosis not present

## 2017-06-20 DIAGNOSIS — M7981 Nontraumatic hematoma of soft tissue: Secondary | ICD-10-CM | POA: Diagnosis not present

## 2017-06-20 DIAGNOSIS — R262 Difficulty in walking, not elsewhere classified: Secondary | ICD-10-CM | POA: Diagnosis not present

## 2017-06-20 DIAGNOSIS — I10 Essential (primary) hypertension: Secondary | ICD-10-CM | POA: Diagnosis not present

## 2017-06-20 DIAGNOSIS — Z86718 Personal history of other venous thrombosis and embolism: Secondary | ICD-10-CM | POA: Diagnosis not present

## 2017-06-20 DIAGNOSIS — M79661 Pain in right lower leg: Secondary | ICD-10-CM | POA: Diagnosis not present

## 2017-06-20 DIAGNOSIS — L7632 Postprocedural hematoma of skin and subcutaneous tissue following other procedure: Secondary | ICD-10-CM | POA: Diagnosis not present

## 2017-06-20 DIAGNOSIS — I97638 Postprocedural hematoma of a circulatory system organ or structure following other circulatory system procedure: Secondary | ICD-10-CM | POA: Diagnosis not present

## 2017-06-20 DIAGNOSIS — I824Y2 Acute embolism and thrombosis of unspecified deep veins of left proximal lower extremity: Secondary | ICD-10-CM | POA: Diagnosis not present

## 2017-06-20 DIAGNOSIS — J309 Allergic rhinitis, unspecified: Secondary | ICD-10-CM | POA: Diagnosis not present

## 2017-06-20 DIAGNOSIS — E039 Hypothyroidism, unspecified: Secondary | ICD-10-CM | POA: Diagnosis not present

## 2017-06-20 DIAGNOSIS — I82431 Acute embolism and thrombosis of right popliteal vein: Secondary | ICD-10-CM | POA: Diagnosis not present

## 2017-06-20 DIAGNOSIS — Z7901 Long term (current) use of anticoagulants: Secondary | ICD-10-CM | POA: Diagnosis not present

## 2017-06-20 DIAGNOSIS — T148XXA Other injury of unspecified body region, initial encounter: Secondary | ICD-10-CM | POA: Diagnosis not present

## 2017-06-21 ENCOUNTER — Other Ambulatory Visit: Payer: Self-pay

## 2017-06-21 DIAGNOSIS — T148XXA Other injury of unspecified body region, initial encounter: Secondary | ICD-10-CM | POA: Insufficient documentation

## 2017-06-21 DIAGNOSIS — I824Y9 Acute embolism and thrombosis of unspecified deep veins of unspecified proximal lower extremity: Secondary | ICD-10-CM | POA: Insufficient documentation

## 2017-06-21 DIAGNOSIS — I824Y2 Acute embolism and thrombosis of unspecified deep veins of left proximal lower extremity: Secondary | ICD-10-CM | POA: Diagnosis not present

## 2017-06-21 NOTE — Patient Outreach (Signed)
[facility]) Care Management  06/21/2017  Marissa Wilson 1945-11-01 [id number]   Transition of care  Referral date: 06/21/17 Referral source: Discharged from an inpatient admission from [place] at [place] on 06/20/17 Insurance: health team advantage  Telephone call to patient regarding transition of care follow up. Unable to reach patient. HIPAA compliant voice message left with call back phone number.    PLAN: RNCM will attempt 2nd telephone outreach to patient within 5 business days.   Quinn Plowman RN,BSN,CCM [facility] Telephonic  [phone]

## 2017-06-24 ENCOUNTER — Ambulatory Visit: Payer: Self-pay

## 2017-06-24 ENCOUNTER — Non-Acute Institutional Stay (SKILLED_NURSING_FACILITY): Payer: PPO | Admitting: Gerontology

## 2017-06-24 ENCOUNTER — Encounter: Payer: Self-pay | Admitting: Gerontology

## 2017-06-24 DIAGNOSIS — T148XXA Other injury of unspecified body region, initial encounter: Secondary | ICD-10-CM | POA: Diagnosis not present

## 2017-06-24 DIAGNOSIS — I82441 Acute embolism and thrombosis of right tibial vein: Secondary | ICD-10-CM | POA: Diagnosis not present

## 2017-06-24 NOTE — Progress Notes (Signed)
Location:   [place] Room Number: 211A Place of Service:  SNF [phone]) Provider:  Toni Arthurs, NP-C  Lavera Guise, MD  Patient Care Team: Lavera Guise, MD as PCP - General (Internal Medicine) Dannielle Karvonen, RN as [place] Management  Extended Emergency Contact Information Primary Emergency Contact: Malachi Bonds Address: [address]          [place], [place] [address] Home Phone: [phone] Work Phone: [phone] Relation: None  Code Status:  FULL Goals of care: Advanced Directive information Advanced Directives 06/24/2017  Does Patient Have a Medical Advance Directive? No     Chief Complaint  Patient presents with  . Medical Management of Chronic Issues    Routine Visit    HPI:  Pt is a 71 y.o. female seen today for medical management of chronic diseases.  Patient was admitted to the facility for rehab status post vein stripping procedure at [facility].  Patient developed a large hematoma in the calf followed by diagnosis of DVT.  Calf is edematous and painful.  Patient and family concerned about worsening condition due to the fact the swelling is not decreasing.  Educated patient and husband about expected length of time for recovery (i.e. expect swelling/visible hematoma for possibly 3-6 months).  Patient denied worsening symptoms of pain, but did report intermittent episodes of slightly worsening edema.  This particularly occurs if her leg has been in the dependent position.  I reeducated patient on need for almost constant elevation.  Bilateral pedal pulses equal and strong.  Cap refills within normal limits.  Patient denies chest pain or shortness of breath.  Vital signs stable.  No other complaints.    Past Medical History:  Diagnosis Date  . Asthma   . Gastrointestinal disorder   . GERD (gastroesophageal reflux disease)   . Hyperlipidemia, unspecified   . Hypertension   . Hypothyroid   . Osteoarthritis   .  Sleep apnea   . Thyroid disease   . UTI (urinary tract infection)    Past Surgical History:  Procedure Laterality Date  . ABDOMINAL HYSTERECTOMY    . PR ENDOVENOUS LASTER, 1ST VEIN Right 06/09/2017   Procedure: EVLA of Right GSV; Surgeon: Lauretta Grill, MD; Location: MAIN OR [facility]; Service: Vascular  . PR LIGATN LONG SAPHENOUS VEIN AT SEPH-FEM JUNC Right 06/09/2017   Procedure: LIGATION & DIVISION OF LONG SAPHENOUS VEIN AT SAPHENOFEMORAL JUNCTION, OR DISTAL INTERRUPTIONS; Surgeon: Lauretta Grill, MD; Location: [facility]; Service: Vascular  . PR PHLEB VEINS EXTREM TO 20 Right 06/09/2017   Procedure: STAB PHLEBECTOMY OF VARICOSE VEINS, 1 EXTREMITY: 10-20 STAB INCISIONS; Surgeon: Lauretta Grill, MD; Location: MAIN OR [facility]; Service: Vascular    Allergies  Allergen Reactions  . Amoxicillin   . Ampicillin Diarrhea  . Cefazolin Diarrhea    Other reaction(s): Other (comments) cdc  . Penicillins     Other reaction(s): Other (comments)  CDC    Allergies as of 06/24/2017      Reactions   Amoxicillin    Ampicillin Diarrhea   Cefazolin Diarrhea   Other reaction(s): Other (comments) cdc   Penicillins    Other reaction(s): Other (comments)  CDC      Medication List        Accurate as of 06/24/17  1:00 PM. Always use your most recent med list.          acetaminophen 650 MG CR tablet Commonly known as:  TYLENOL Take 650  mg every 6 (six) hours by mouth.   Calcium Carb-Cholecalciferol 500-200 MG-UNIT Tabs Take 1 tablet daily by mouth.   Cholecalciferol 1000 units capsule Take 1,000 Units daily by mouth.   docusate sodium 100 MG capsule Commonly known as:  COLACE Take 100 mg 2 (two) times daily by mouth.   levothyroxine 75 MCG tablet Commonly known as:  SYNTHROID, LEVOTHROID Take 75 mcg daily before breakfast by mouth.   montelukast 10 MG tablet Commonly known as:  SINGULAIR Take 10 mg at bedtime by mouth.   omeprazole 40 MG capsule Commonly  known as:  PRILOSEC Take 40 mg daily by mouth.   Oxycodone HCl 10 MG Tabs Take 10 mg every 4 (four) hours as needed by mouth.   polyethylene glycol packet Commonly known as:  MIRALAX / GLYCOLAX Take 17 g 2 (two) times daily by mouth.   triamterene-hydrochlorothiazide 37.5-25 MG capsule Commonly known as:  DYAZIDE Take 1 capsule daily by mouth.   XARELTO 10 MG Tabs tablet Generic drug:  rivaroxaban Take 10 mg daily by mouth.       Review of Systems  Constitutional: Negative for activity change, appetite change, chills, diaphoresis and fever.  HENT: Negative for congestion, sneezing, sore throat, trouble swallowing and voice change.   Respiratory: Negative for apnea, cough, choking, chest tightness, shortness of breath and wheezing.   Cardiovascular: Positive for leg swelling. Negative for chest pain and palpitations.  Gastrointestinal: Negative for abdominal distention, abdominal pain, constipation, diarrhea and nausea.  Genitourinary: Negative for difficulty urinating, dysuria, frequency and urgency.  Musculoskeletal: Positive for arthralgias (typical arthritis). Negative for back pain, gait problem and myalgias.  Skin: Negative for color change, pallor, rash and wound.  Neurological: Negative for dizziness, tremors, syncope, speech difficulty, weakness, numbness and headaches.  Psychiatric/Behavioral: Negative for agitation and behavioral problems.  All other systems reviewed and are negative.   Immunization History  Administered Date(s) Administered  . Influenza-Unspecified 04/22/2017   Pertinent  Health Maintenance Due  Topic Date Due  . COLONOSCOPY  07/30/1995  . PNA vac Low Risk Adult (1 of 2 - PCV13) 07/29/2010  . MAMMOGRAM  10/19/2018  . INFLUENZA VACCINE  Completed  . DEXA SCAN  Completed   No flowsheet data found. Functional Status Survey:    Vitals:   06/24/17 1210  BP: 108/70  Pulse: 69  Resp: 20  Temp: 97.7 F (36.5 C)  TempSrc: Oral  SpO2: 92%    Weight: 158 lb 1.6 oz (71.7 kg)  Height: 5' 2.5" (1.588 m)   Body mass index is 28.46 kg/m. Physical Exam  Constitutional: She is oriented to person, place, and time. Vital signs are normal. She appears well-developed and well-nourished. She is active and cooperative. She does not appear ill. No distress.  HENT:  Head: Normocephalic and atraumatic.  Mouth/Throat: Uvula is midline, oropharynx is clear and moist and mucous membranes are normal. Mucous membranes are not pale, not dry and not cyanotic.  Eyes: Conjunctivae, EOM and lids are normal. Pupils are equal, round, and reactive to light.  Neck: Trachea normal, normal range of motion and full passive range of motion without pain. Neck supple. No JVD present. No tracheal deviation, no edema and no erythema present. No thyromegaly present.  Cardiovascular: Normal rate, regular rhythm, normal heart sounds, intact distal pulses and normal pulses. Exam reveals no gallop, no distant heart sounds and no friction rub.  No murmur heard. Pulses:      Dorsalis pedis pulses are 2+ on the right side,  and 2+ on the left side.  1+ right lower extremity edema  Pulmonary/Chest: Effort normal and breath sounds normal. No accessory muscle usage. No respiratory distress. She has no decreased breath sounds. She has no wheezes. She has no rhonchi. She has no rales. She exhibits no tenderness.  Abdominal: Soft. Normal appearance and bowel sounds are normal. She exhibits no distension and no ascites. There is no tenderness.  Musculoskeletal: Normal range of motion.       Right lower leg: She exhibits tenderness, swelling and edema.  Expected osteoarthritis, stiffness; left calf soft, supple.  Negative Homans sign  Neurological: She is alert and oriented to person, place, and time. She has normal strength.  Skin: Skin is warm, dry and intact. She is not diaphoretic. No cyanosis. No pallor. Nails show no clubbing.  Psychiatric: She has a normal mood and affect.  Her speech is normal and behavior is normal. Judgment and thought content normal. Cognition and memory are normal.  Nursing note and vitals reviewed.   Labs reviewed: No results for input(s): NA, K, CL, CO2, GLUCOSE, BUN, CREATININE, CALCIUM, MG, PHOS in the last 8760 hours. No results for input(s): AST, ALT, ALKPHOS, BILITOT, PROT, ALBUMIN in the last 8760 hours. No results for input(s): WBC, NEUTROABS, HGB, HCT, MCV, PLT in the last 8760 hours. Lab Results  Component Value Date   TSH 2.24 02/09/2013   No results found for: HGBA1C Lab Results  Component Value Date   CHOL 218 (H) 02/09/2013   HDL 65 (H) 02/09/2013   LDLCALC 130 (H) 02/09/2013   TRIG 114 02/09/2013    Significant Diagnostic Results in last 30 days:  No results found.  Assessment/Plan 1.  Acute deep vein thrombosis of tibial vein of right lower extremity  Continue Xarelto 10 mg tablets p.o. daily  Alternate heat and ice to area as needed  2.  Hematoma  Alternate heat and ice to area as needed  Continue oxycodone 10 mg tablets p.o. every 4 hours as needed pain  Robaxin 500 mg p.o. every 6 hours as needed pain  Continue Lidoderm 5% patch daily.  Remove after 12 hours  Follow-up with vascular surgeon as instructed   Family/ staff Communication:   Total Time:  Documentation:  Face to Face:  Family/Phone:  Labs/tests ordered:    Medication list reviewed and assessed for continued appropriateness. Monthly medication orders reviewed and signed.  Vikki Ports, NP-C Geriatrics [facility] [phone] [place], [place] [address] Cell Phone (Mon-Fri 8am-5pm):  [phone] On Call:  [phone] & follow prompts after 5pm & weekends Office Phone:  [phone] Office Fax:  [phone]

## 2017-06-27 ENCOUNTER — Other Ambulatory Visit: Payer: Self-pay

## 2017-06-27 DIAGNOSIS — M79661 Pain in right lower leg: Secondary | ICD-10-CM | POA: Diagnosis not present

## 2017-06-27 DIAGNOSIS — M7989 Other specified soft tissue disorders: Secondary | ICD-10-CM | POA: Diagnosis not present

## 2017-06-27 DIAGNOSIS — I10 Essential (primary) hypertension: Secondary | ICD-10-CM | POA: Diagnosis not present

## 2017-06-27 DIAGNOSIS — M7981 Nontraumatic hematoma of soft tissue: Secondary | ICD-10-CM | POA: Diagnosis not present

## 2017-06-27 DIAGNOSIS — Z86718 Personal history of other venous thrombosis and embolism: Secondary | ICD-10-CM | POA: Diagnosis not present

## 2017-06-27 DIAGNOSIS — E039 Hypothyroidism, unspecified: Secondary | ICD-10-CM | POA: Diagnosis not present

## 2017-06-27 DIAGNOSIS — I97638 Postprocedural hematoma of a circulatory system organ or structure following other circulatory system procedure: Secondary | ICD-10-CM | POA: Diagnosis not present

## 2017-06-27 DIAGNOSIS — K219 Gastro-esophageal reflux disease without esophagitis: Secondary | ICD-10-CM | POA: Diagnosis not present

## 2017-06-27 NOTE — Patient Outreach (Signed)
[facility]) Care Management  06/27/2017  Marissa Wilson [birth year] [id number]  Transition of care  Referral date: 06/21/17 Referral source: Discharged from an inpatient admission from [place] at [place] on 06/20/17 Insurance: health team advantage Attempt #2  Second telephone call to patient regarding transition of care follow up. Unable to reach patient. HIPAA compliant voice message left with call back phone number.    PLAN: RNCM will attempt 3rd  telephone outreach to patient within 5 business days.   Quinn Plowman RN,BSN,CCM [facility] Telephonic  [phone]

## 2017-06-28 ENCOUNTER — Other Ambulatory Visit: Payer: Self-pay

## 2017-06-28 NOTE — Patient Outreach (Signed)
[facility]) Care Management  06/28/2017  Marissa Wilson 02-13-1946 [id number]   Transition of care  Referral date:06/21/17 Referral source:Discharged from an inpatient admission from [place] at [place] on 06/20/17 Insurance:health team advantage Attempt #3  Second telephone call to patient regarding transition of care follow up. Unable to reach patient. HIPAA compliant voice message left with call back phone number.   PLAN:RNCM will send patient outreach letter to attempt contact with patient.  Quinn Plowman RN,BSN,CCM [facility] Telephonic  [phone]

## 2017-06-29 ENCOUNTER — Other Ambulatory Visit: Payer: Self-pay

## 2017-06-29 MED ORDER — OXYCODONE HCL 10 MG PO TABS: 10.0000 mg | ORAL_TABLET | ORAL | 0 refills | Status: DC | PRN

## 2017-06-29 NOTE — Telephone Encounter (Signed)
Rx sent to [facility] phone : [phone] , fax : [phone]  

## 2017-06-30 ENCOUNTER — Non-Acute Institutional Stay (SKILLED_NURSING_FACILITY): Payer: PPO | Admitting: Gerontology

## 2017-06-30 DIAGNOSIS — T148XXA Other injury of unspecified body region, initial encounter: Secondary | ICD-10-CM

## 2017-06-30 DIAGNOSIS — I82441 Acute embolism and thrombosis of right tibial vein: Secondary | ICD-10-CM | POA: Diagnosis not present

## 2017-07-01 ENCOUNTER — Encounter: Payer: Self-pay | Admitting: Gerontology

## 2017-07-01 NOTE — Progress Notes (Signed)
Location:   [facility] Room Number:   Place of Service:  SNF [phone])  Provider: Toni Arthurs, NP-C  PCP: Lavera Guise, MD Patient Care Team: Lavera Guise, MD as PCP - General (Internal Medicine) Dannielle Karvonen, RN as [place] Management  Extended Emergency Contact Information Primary Emergency Contact: Malachi Bonds Address: [address]          [place], [place] [address] Home Phone: [phone] Work Phone: [phone] Relation: None  Code Status: FULL Goals of care:  Advanced Directive information Advanced Directives 06/30/2017  Does Patient Have a Medical Advance Directive? No  Would patient like information on creating a medical advance directive? No - Patient declined     Allergies  Allergen Reactions  . Amoxicillin   . Ampicillin Diarrhea  . Cefazolin Diarrhea    Other reaction(s): Other (comments) cdc  . Penicillins     Other reaction(s): Other (comments)  CDC    Chief Complaint  Patient presents with  . Discharge Note    Discharged from SNF    HPI:  71 y.o. female seen today for discharge evaluation.  Patient was admitted to the facility for rehab status post vein stripping procedure at [facility].  Patient developed a large hematoma in the calf followed by diagnosis of DVT.  Calf is edematous and painful.  Patient and family continue to be concerned about the pain.  Again, reeducated patient on expected length of recovery.  Edema does appear to be somewhat improved since last visit.  Patient has been keeping Ace wrap compression on the area with elevation.  Bilateral pedal pulses equal and strong.  Cap refills within normal limits.  Compartments soft and pliable.  Patient denies chest pain or shortness of breath.  She has been participating in PT and OT feels she is ready for discharge.  She reports her pain is generally well controlled on current regimen.  Appetite is good.  Having regular BMs.  Vital signs  stable.  No other complaints.    Past Medical History:  Diagnosis Date  . Asthma   . Gastrointestinal disorder   . GERD (gastroesophageal reflux disease)   . Hyperlipidemia, unspecified   . Hypertension   . Hypothyroid   . Osteoarthritis   . Sleep apnea   . Thyroid disease   . UTI (urinary tract infection)     Past Surgical History:  Procedure Laterality Date  . ABDOMINAL HYSTERECTOMY    . PR ENDOVENOUS LASTER, 1ST VEIN Right 06/09/2017   Procedure: EVLA of Right GSV; Surgeon: Lauretta Grill, MD; Location: MAIN OR [facility]; Service: Vascular  . PR LIGATN LONG SAPHENOUS VEIN AT SEPH-FEM JUNC Right 06/09/2017   Procedure: LIGATION & DIVISION OF LONG SAPHENOUS VEIN AT SAPHENOFEMORAL JUNCTION, OR DISTAL INTERRUPTIONS; Surgeon: Lauretta Grill, MD; Location: MAIN OR [facility]; Service: Vascular  . PR PHLEB VEINS EXTREM TO 20 Right 06/09/2017   Procedure: STAB PHLEBECTOMY OF VARICOSE VEINS, 1 EXTREMITY: 10-20 STAB INCISIONS; Surgeon: Lauretta Grill, MD; Location: MAIN OR [facility]; Service: Vascular      reports that  has never smoked. she has never used smokeless tobacco. She reports that she drinks alcohol. She reports that she does not use drugs. Social History   Socioeconomic History  . Marital status: Married    Spouse name: Nada Boozer  . Number of children: 2  . Years of education: 64  . Highest education level: Some college, no degree  Social Needs  . Emergency planning/management officer  strain: Not on file  . Food insecurity - worry: Not on file  . Food insecurity - inability: Not on file  . Transportation needs - medical: Not on file  . Transportation needs - non-medical: Not on file  Occupational History  . Not on file  Tobacco Use  . Smoking status: Never Smoker  . Smokeless tobacco: Never Used  Substance and Sexual Activity  . Alcohol use: Yes    Comment: occasional wine  . Drug use: No  . Sexual activity: Not on file  Other Topics Concern  . Not on file  Social History  Narrative  . Not on file   Functional Status Survey:    Allergies  Allergen Reactions  . Amoxicillin   . Ampicillin Diarrhea  . Cefazolin Diarrhea    Other reaction(s): Other (comments) cdc  . Penicillins     Other reaction(s): Other (comments)  CDC    Pertinent  Health Maintenance Due  Topic Date Due  . COLONOSCOPY  07/30/1995  . PNA vac Low Risk Adult (1 of 2 - PCV13) 07/29/2010  . MAMMOGRAM  10/19/2018  . INFLUENZA VACCINE  Completed  . DEXA SCAN  Completed    Medications: Allergies as of 06/30/2017      Reactions   Amoxicillin    Ampicillin Diarrhea   Cefazolin Diarrhea   Other reaction(s): Other (comments) cdc   Penicillins    Other reaction(s): Other (comments)  CDC      Medication List        Accurate as of 06/30/17 11:59 PM. Always use your most recent med list.          acetaminophen 650 MG CR tablet Commonly known as:  TYLENOL Take 650 mg every 6 (six) hours by mouth.   Calcium Carb-Cholecalciferol 500-200 MG-UNIT Tabs Take 1 tablet daily by mouth.   Cholecalciferol 1000 units capsule Take 1,000 Units daily by mouth.   docusate sodium 100 MG capsule Commonly known as:  COLACE Take 100 mg 2 (two) times daily by mouth.   levothyroxine 75 MCG tablet Commonly known as:  SYNTHROID, LEVOTHROID Take 75 mcg daily before breakfast by mouth.   lidocaine 5 % Commonly known as:  LIDODERM Place 1 patch daily onto the skin. Apply to right calf , Remove & Discard patch within 12 hours or as directed by MD   methocarbamol 500 MG tablet Commonly known as:  ROBAXIN Take 500 mg every 6 (six) hours as needed by mouth for muscle spasms.   montelukast 10 MG tablet Commonly known as:  SINGULAIR Take 10 mg at bedtime by mouth.   omeprazole 40 MG capsule Commonly known as:  PRILOSEC Take 40 mg daily by mouth.   Oxycodone HCl 10 MG Tabs Take 1 tablet (10 mg total) every 4 (four) hours as needed by mouth.   polyethylene glycol packet Commonly known  as:  MIRALAX / GLYCOLAX Take 17 g 2 (two) times daily by mouth.   triamterene-hydrochlorothiazide 37.5-25 MG capsule Commonly known as:  DYAZIDE Take 1 capsule daily by mouth.   XARELTO 10 MG Tabs tablet Generic drug:  rivaroxaban Take 10 mg daily by mouth.       Review of Systems  Vitals:   07/01/17 1154  BP: (!) 110/54  Pulse: 74  Resp: 20  Temp: 98.1 F (36.7 C)  TempSrc: Oral  SpO2: 96%  Weight: 155 lb 8 oz (70.5 kg)  Height: 5' 2.5" (1.588 m)   Body mass index is 27.99 kg/m. Physical Exam  Labs reviewed: Basic Metabolic Panel: No results for input(s): NA, K, CL, CO2, GLUCOSE, BUN, CREATININE, CALCIUM, MG, PHOS in the last 8760 hours. Liver Function Tests: No results for input(s): AST, ALT, ALKPHOS, BILITOT, PROT, ALBUMIN in the last 8760 hours. No results for input(s): LIPASE, AMYLASE in the last 8760 hours. No results for input(s): AMMONIA in the last 8760 hours. CBC: No results for input(s): WBC, NEUTROABS, HGB, HCT, MCV, PLT in the last 8760 hours. Cardiac Enzymes: No results for input(s): CKTOTAL, CKMB, CKMBINDEX, TROPONINI in the last 8760 hours. BNP: Invalid input(s): POCBNP CBG: No results for input(s): GLUCAP in the last 8760 hours.  Procedures and Imaging Studies During Stay: No results found.  Assessment/Plan:   1.  Acute deep vein thrombosis of tibial vein of right lower extremity  Continue Xarelto 10 mg tablets p.o. daily  Alternate heat and ice to area as needed  2. Hematoma  Alternate heat and ice to area as needed  Continue Ace wrap compression during the day  Continue oxycodone 10 mg tablets p.o. every 4 hours as needed pain #30, no refill  Continue Robaxin 500 mg p.o. every 6 hours as needed pain  Continue Lidoderm 5% patch daily.  Remove after 12 hours  Follow-up with vascular surgeon as instructed for continuity of care after discharge   Patient is being discharged with the following home health services: Outpatient  PT  Patient is being discharged with the following durable medical equipment: Rollator  Patient has been advised to f/u with their PCP in 1-2 weeks to bring them up to date on their rehab stay.  Social services at facility was responsible for arranging this appointment.  Pt was provided with a 30 day supply of prescriptions for medications and refills must be obtained from their PCP.  For controlled substances, a more limited supply may be provided adequate until PCP appointment only.  Future labs/tests needed:    Family/ staff Communication:   Total Time:  Documentation:  Face to Face:  Family/Phone:  Vikki Ports, NP-C [facility] [address], [place] [address] Cell Phone (Mon-Fri 8am-5pm):  [phone] On Call:  [phone] & follow prompts after 5pm & weekends Office Phone:  [phone] Office Fax:  [phone]

## 2017-07-04 ENCOUNTER — Ambulatory Visit: Payer: PPO | Attending: Vascular Surgery

## 2017-07-04 DIAGNOSIS — R2689 Other abnormalities of gait and mobility: Secondary | ICD-10-CM

## 2017-07-04 DIAGNOSIS — M79661 Pain in right lower leg: Secondary | ICD-10-CM | POA: Diagnosis not present

## 2017-07-04 NOTE — Therapy (Signed)
[place] MAIN [facility] SERVICES [address] [place], [place], [address] Phone: [phone]   Fax:  [phone]  Physical Therapy Evaluation  Patient Details  Name: Marissa Wilson MRN: [id number] Date of Birth: Jul 01, 1946 Referring Provider: Thomasene Mohair   Encounter Date: 07/04/2017  PT End of Session - 07/04/17 2057    Visit Number  1    Number of Visits  8    Date for PT Re-Evaluation  08/01/17    Authorization - Visit Number  1    Authorization - Number of Visits  10    PT Start Time  1640    PT Stop Time  1741    PT Time Calculation (min)  61 min    Activity Tolerance  Patient tolerated treatment well;Treatment limited secondary to medical complications (Comment)    Behavior During Therapy  [facility] for tasks assessed/performed       Past Medical History:  Diagnosis Date  . Asthma   . Gastrointestinal disorder   . GERD (gastroesophageal reflux disease)   . Hyperlipidemia, unspecified   . Hypertension   . Hypothyroid   . Osteoarthritis   . Sleep apnea   . Thyroid disease   . UTI (urinary tract infection)     Past Surgical History:  Procedure Laterality Date  . ABDOMINAL HYSTERECTOMY    . PR ENDOVENOUS LASTER, 1ST VEIN Right 06/09/2017   Procedure: EVLA of Right GSV; Surgeon: Lauretta Grill, MD; Location: MAIN OR [facility]; Service: Vascular  . PR LIGATN LONG SAPHENOUS VEIN AT SEPH-FEM JUNC Right 06/09/2017   Procedure: LIGATION & DIVISION OF LONG SAPHENOUS VEIN AT SAPHENOFEMORAL JUNCTION, OR DISTAL INTERRUPTIONS; Surgeon: Lauretta Grill, MD; Location: MAIN OR [facility]; Service: Vascular  . PR PHLEB VEINS EXTREM TO 20 Right 06/09/2017   Procedure: STAB PHLEBECTOMY OF VARICOSE VEINS, 1 EXTREMITY: 10-20 STAB INCISIONS; Surgeon: Lauretta Grill, MD; Location: MAIN OR [facility]; Service: Vascular    There were no vitals filed for this visit.   Subjective Assessment - 07/04/17 1711    Subjective  Patient is a  pleasant 71 year old female who presents to PT for hematoma s/p surgery with rollater and wrapped RLE.     Pertinent History  Patient had surgery on October 25th to remove veins in legs, tied saphenous leg off in upper thigh and removed deeper bulging veins. Pt. Stayed overnight at [facility] due to blood clot history. Went home the next day and on Sunday the 28th I had excruciating pain in R calf. Doppler performed on 29th and found a hematoma and placed in hospital for a week. Left hospital and went to [facility]. Had PT for walking for a week. Has a rollater to use in public but has started decreasing use in home. Right calf is wrapped and painful upon posterior calf  Muscular contraction. .    Limitations  Sitting;Lifting;Standing;Walking;House hold activities    How long can you sit comfortably?  30 minutes    How long can you stand comfortably?  10 minutes    How long can you walk comfortably?  when standing    Diagnostic tests  Doppler    Patient Stated Goals  Decrease pain and swelling and improve quality of walking.     Currently in Pain?  Yes    Pain Score  7     Pain Location  Calf    Pain Orientation  Right    Pain Descriptors / Indicators  Squeezing;Tightness;Shooting  Pain Type  Acute pain;Surgical pain    Pain Radiating Towards  up the leg    Pain Onset  1 to 4 weeks ago    Pain Frequency  Constant    Aggravating Factors   extensive PT, stretching, standing    Pain Relieving Factors  elevating leg        Patient has wrapped calf from toes to top of calf.   [facility] PT Assessment - 07/04/17 0001      Assessment   Medical Diagnosis  Right leg swelling    Referring Provider  Thomasene Mohair    Onset Date/Surgical Date  06/13/17    Hand Dominance  Right    Next MD Visit  next week    Prior Therapy  yes       Precautions   Precautions  Other (comment) not to do housework     Required Braces or Orthoses  -- wrap around R calf.       Restrictions   Weight Bearing Restrictions   No      Balance Screen   Has the patient fallen in the past 6 months  No    Has the patient had a decrease in activity level because of a fear of falling?   Yes    Is the patient reluctant to leave their home because of a fear of falling?   Yes      [place] residence    Living Arrangements  Spouse/significant other    Available Help at Discharge  Family    Type of [place] Access  Level entry    [place]  One level    [place] - 4 wheels;Shower seat;Grab bars - tub/shower;Grab bars - toilet;Hand held shower head      Prior Function   Level of Independence  Independent    Vocation  Retired;Volunteer work    Leisure  home decorating       Cognition   Overall Cognitive Status  Within Functional Limits for tasks assessed      Observation/Other Assessments   Observations  swelling of R calf, decreased weight shift on R    Focus on Therapeutic Outcomes (FOTO)   LEFS, ABC      Observation/Other Assessments-Edema    Edema  Circumferential      Circumferential Edema   Circumferential - Right  32.5    Circumferential - Left   28      Sensation   Light Touch  Appears Intact      Coordination   Gross Motor Movements are Fluid and Coordinated  No    Coordination and Movement Description  difficulty moving R ankle due to swelling, limited coordination with R foot      Posture/Postural Control   Posture/Postural Control  Postural limitations    Postural Limitations  Weight shift left      Transfers   Five time sit to stand comments   decreased push off of RLE      Ambulation/Gait   Ambulation/Gait  Yes    Ambulation/Gait Assistance  6: Modified independent (Device/Increase time)    Assistive device  None    Gait Pattern  Decreased hip/knee flexion - right;Decreased stance time - right;Lateral trunk lean to left;Poor foot clearance - right    Ambulation Surface  Level;Indoor    Gait velocity  .77        PAIN: Worst pain:  7/10 Best pain 6/10 Current pain: 7/10   POSTURE: Decreased weight shift onto RLE.   PROM/AROM: Decreased right ankle and knee mobility due to swelling   STRENGTH:  Graded on a 0-5 scale Muscle Group Left Right  Shoulder flex    Shoulder Abd    Shoulder Ext    Shoulder IR/ER    Elbow    Wrist/hand    Hip Flex 4/5 4/5  Hip Abd 4/5 4/5  Hip Add 4/5 4/5  Hip Ext 4/5 4-/5  Hip IR/ER    Knee Flex 4/5 2+/5 due to limited mobility  Knee Ext 4/5 4-/5  Ankle DF 4/5 2+/5 due to limited mobility  Ankle PF 4/5 2+/5 due to limited mobility   SENSATION: No sensation differences between R and L Gets some tingling on spot of hematoma   SPECIAL TESTS: Mid Calf L 28 R 32.5    OUTCOME MEASURES: TEST Outcome Interpretation  5 times sit<>stand 12 sec with minimal pushing off of R leg  >60 yo, >15 sec indicates increased risk for falls  10 meter walk test        .77     m/s <1.0 m/s indicates increased risk for falls; limited community ambulator  LEFS 40/80   ABC 53.1% Increased risk for falls                    Objective meahesurements completed on examination: See above findings.              PT Education - 07/04/17 2055    Education provided  Yes    Education Details  continuation of HEP and ultrasound measurement    Person(s) Educated  Patient    Methods  Explanation;Demonstration;Verbal cues    Comprehension  Verbalized understanding;Returned demonstration       PT Short Term Goals - 07/04/17 2111      PT SHORT TERM GOAL #1   Title  Patient will increase 10 meter walk test to >1.67m/s as to improve gait speed for better community ambulation and to reduce fall risk.    Baseline  11/19: .77 m/s     Time  2    Period  Weeks    Status  New    Target Date  07/18/17      PT SHORT TERM GOAL #2   Title  Patient will increase ABC scale score >60% to demonstrate better functional mobility and better confidence with ADLs.     Baseline   11/19: 53.1%    Time  2    Period  Weeks    Status  New    Target Date  07/18/17      PT SHORT TERM GOAL #3   Title  Patient will have circumfrential measurements of calves within 1 cm of each other for decreased swelling and pain relief    Baseline  R 32.5 L 28    Time  2    Period  Weeks    Status  New    Target Date  07/18/17        PT Long Term Goals - 07/04/17 2116      PT LONG TERM GOAL #1   Title  Patient will increase ABC scale score >80% to demonstrate better functional mobility and better confidence with ADLs.     Baseline  11/19:  53.1%    Time  4    Period  Weeks    Status  New    Target  Date  08/01/17      PT LONG TERM GOAL #2   Title  Patient will report a worst pain of 3/10 on VAS in  R calf to improve tolerance with ADLs and reduced symptoms with activities.     Baseline  11/19: 7/10 pain    Time  4    Period  Weeks    Status  New    Target Date  08/01/17      PT LONG TERM GOAL #3   Title  Patient will increase lower extremity functional scale to >60/80 to demonstrate improved functional mobility and increased tolerance with ADLs.     Baseline  11/19: 40/80    Time  4    Period  Weeks    Status  New    Target Date  08/01/17      PT LONG TERM GOAL #4   Title  Patient will ambulate with equal weight acceptance upon L and R LE's to demonstrate improved quality of movement    Baseline  decreased weight acceptance upon RLE>     Time  4    Period  Weeks    Status  New    Target Date  08/01/17             Plan - 07/04/17 2059    Clinical Impression Statement  Patient is a pleasant 71 year old female who presents to physical therapy for a hematoma s/p surgery. R calf hematoma has been present since 06/13/17. She has decreased weight shift over RLE due to fear of pain during weight acceptance, especially during mobility, R calf is wrapped and patient utilizes a rollater in public, however does not use it frequently around home. Ambulatory mechanics  without walker show decreased weight acceptance and mobility of RLE due to swelling an pain. Patient performs 5x STS with decreased push through RLE in 12 seconds, 10MWT=.66m/s, LEFS=40/80, ABC=53.1%. Circumfrential measurements of calf are R=32.5 L 28cm. Patient will benefit from skilled physical therapy to decrease pain, decrease swelling, and improve mobility for increased quality of life.     History and Personal Factors relevant to plan of care:  This patient presents with 3 personal factors/ comorbidities and 4  body elements including body structures and functions, activity limitations and or participation restrictions. Patient's condition is  unstable    Clinical Presentation  Unstable    Clinical Presentation due to:  hematoma s/p surgery, history of DVT    Clinical Decision Making  High    Rehab Potential  Fair    Clinical Impairments Affecting Rehab Potential  hematoma, DVTs, GERD, thyroid    PT Frequency  2x / week    PT Duration  4 weeks    PT Treatment/Interventions  ADLs/Self Care Home Management;Electrical Stimulation;Cryotherapy;Biofeedback;Iontophoresis 4mg /ml Dexamethasone;Moist Heat;Ultrasound;Parrafin;Therapeutic exercise;Therapeutic activities;Functional mobility training;Stair training;Gait training;DME Instruction;Balance training;Neuromuscular re-education;Patient/family education;Compression bandaging;Manual techniques;Passive range of motion;Dry needling;Energy conservation;Visual/perceptual remediation/compensation    PT Next Visit Plan  ultrasound 3.3 mHz, 2 cycles 50% had warmer optional, intensity 1.5w/cm. Estim-polarity positive frequency 100 pulses per second. Cycle time 5 on 5 off continuous sweep. Ramp 2 seconds 100 -115 volts (maximum is 500 volts) to right leg    Consulted and Agree with Plan of Care  Patient       Patient will benefit from skilled therapeutic intervention in order to improve the following deficits and impairments:  Abnormal gait, Decreased activity  tolerance, Decreased balance, Decreased endurance, Decreased coordination, Decreased mobility, Decreased range of motion, Increased edema, Difficulty  walking, Decreased strength, Impaired perceived functional ability, Impaired flexibility, Improper body mechanics, Postural dysfunction, Pain  Visit Diagnosis: Pain in right lower leg  Other abnormalities of gait and mobility  G-Codes - 07/08/17 Nov 15, 2121    Functional Assessment Tool Used (Outpatient Only)  10MWT, 5xSTS, ABC, LEFS, measurement, clinical judgement, gait mechanics     Functional Limitation  Mobility: Walking and moving around    Mobility: Walking and Moving Around Current Status [phone])  At least 40 percent but less than 60 percent impaired, limited or restricted    Mobility: Walking and Moving Around Goal Status [phone])  At least 1 percent but less than 20 percent impaired, limited or restricted        Problem List Patient Active Problem List   Diagnosis Date Noted  . Chronic sinusitis with recurrent bronchitis 02/03/2016  . Phlebitis and thrombophlebitis of femoral vein ([place]) 01/26/2011  . 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

## 2017-07-06 ENCOUNTER — Ambulatory Visit: Payer: PPO

## 2017-07-06 ENCOUNTER — Other Ambulatory Visit: Payer: Self-pay

## 2017-07-06 DIAGNOSIS — S8011XA Contusion of right lower leg, initial encounter: Secondary | ICD-10-CM | POA: Diagnosis not present

## 2017-07-06 DIAGNOSIS — M7989 Other specified soft tissue disorders: Secondary | ICD-10-CM | POA: Diagnosis not present

## 2017-07-06 DIAGNOSIS — M79661 Pain in right lower leg: Secondary | ICD-10-CM

## 2017-07-06 DIAGNOSIS — R2689 Other abnormalities of gait and mobility: Secondary | ICD-10-CM

## 2017-07-06 NOTE — Therapy (Signed)
[place] MAIN [facility] SERVICES [address] [place], [place], [address] Phone: [phone]   Fax:  [phone]  Physical Therapy Treatment  Patient Details  Name: Marissa Wilson MRN: [id number] Date of Birth: [birth year] Referring Provider: Thomasene Mohair   Encounter Date: 07/06/2017  PT End of Session - 07/06/17 1433    Visit Number  2    Number of Visits  8    Date for PT Re-Evaluation  08/01/17    Authorization - Visit Number  2    Authorization - Number of Visits  10    PT Start Time  1300    PT Stop Time  1345    PT Time Calculation (min)  45 min    Equipment Utilized During Treatment  Gait belt    Activity Tolerance  Patient tolerated treatment well;Treatment limited secondary to medical complications (Comment)    Behavior During Therapy  [facility] for tasks assessed/performed       Past Medical History:  Diagnosis Date  . Asthma   . Gastrointestinal disorder   . GERD (gastroesophageal reflux disease)   . Hyperlipidemia, unspecified   . Hypertension   . Hypothyroid   . Osteoarthritis   . Sleep apnea   . Thyroid disease   . UTI (urinary tract infection)     Past Surgical History:  Procedure Laterality Date  . ABDOMINAL HYSTERECTOMY    . PR ENDOVENOUS LASTER, 1ST VEIN Right 06/09/2017   Procedure: EVLA of Right GSV; Surgeon: Lauretta Grill, MD; Location: MAIN OR [facility]; Service: Vascular  . PR LIGATN LONG SAPHENOUS VEIN AT SEPH-FEM JUNC Right 06/09/2017   Procedure: LIGATION & DIVISION OF LONG SAPHENOUS VEIN AT SAPHENOFEMORAL JUNCTION, OR DISTAL INTERRUPTIONS; Surgeon: Lauretta Grill, MD; Location: MAIN OR [facility]; Service: Vascular  . PR PHLEB VEINS EXTREM TO 20 Right 06/09/2017   Procedure: STAB PHLEBECTOMY OF VARICOSE VEINS, 1 EXTREMITY: 10-20 STAB INCISIONS; Surgeon: Lauretta Grill, MD; Location: MAIN OR [facility]; Service: Vascular    There were no vitals filed for this visit.  Subjective  Assessment - 07/06/17 1302    Subjective  Patient wearing compression socks today. patient walked around block yesterday.     Pertinent History  Patient had surgery on October 25th to remove veins in legs, tied saphenous leg off in upper thigh and removed deeper bulging veins. Pt. Stayed overnight at [facility] due to blood clot history. Went home the next day and on Sunday the 28th I had excruciating pain in R calf. Doppler performed on 29th and found a hematoma and placed in hospital for a week. Left hospital and went to [facility]. Had PT for walking for a week. Has a rollater to use in public but has started decreasing use in home. Right calf is wrapped and painful upon posterior calf  Muscular contraction. .    Limitations  Sitting;Lifting;Standing;Walking;House hold activities    How long can you sit comfortably?  30 minutes    How long can you stand comfortably?  10 minutes    How long can you walk comfortably?  when standing    Diagnostic tests  Doppler    Patient Stated Goals  Decrease pain and swelling and improve quality of walking.     Currently in Pain?  Yes    Pain Score  5     Pain Location  Calf    Pain Orientation  Right    Pain Descriptors / Indicators  Tightness    Pain Type  Acute pain;Surgical pain    Pain Onset  1 to 4 weeks ago      6 min walk test: 1080 ft   Ambulating without AD in gym with cues for lifting feet.   Sit to stand  ultrasound 3.3 mHz, 2 cycles 50% had warmer optional, intensity 1.5w/cm.   Estim-polarity positive frequency 100 pulses per second. Cycle time 5 on 5 off continuous sweep. Ramp 2 seconds 100 -115 volts (maximum is 500 volts) to right leg    Pt. response to medical necessity:  Patient will continue to benefit from skilled physical therapy services to decrease pain, decrease swelling, and improve mobility for increased QOL.                       PT Education - 07/06/17 1432    Education provided  Yes    Education Details  E  stim home set up    Person(s) Educated  Patient    Methods  Explanation    Comprehension  Verbalized understanding       PT Short Term Goals - 07/04/17 2111      PT SHORT TERM GOAL #1   Title  Patient will increase 10 meter walk test to >1.83m/s as to improve gait speed for better community ambulation and to reduce fall risk.    Baseline  11/19: .77 m/s     Time  2    Period  Weeks    Status  New    Target Date  07/18/17      PT SHORT TERM GOAL #2   Title  Patient will increase ABC scale score >60% to demonstrate better functional mobility and better confidence with ADLs.     Baseline  11/19: 53.1%    Time  2    Period  Weeks    Status  New    Target Date  07/18/17      PT SHORT TERM GOAL #3   Title  Patient will have circumfrential measurements of calves within 1 cm of each other for decreased swelling and pain relief    Baseline  R 32.5 L 28    Time  2    Period  Weeks    Status  New    Target Date  07/18/17        PT Long Term Goals - 07/06/17 1435      PT LONG TERM GOAL #1   Title  Patient will increase ABC scale score >80% to demonstrate better functional mobility and better confidence with ADLs.     Baseline  11/19:  53.1%    Time  4    Period  Weeks    Status  New      PT LONG TERM GOAL #2   Title  Patient will report a worst pain of 3/10 on VAS in  R calf to improve tolerance with ADLs and reduced symptoms with activities.     Baseline  11/19: 7/10 pain    Time  4    Period  Weeks    Status  New      PT LONG TERM GOAL #3   Title  Patient will increase lower extremity functional scale to >60/80 to demonstrate improved functional mobility and increased tolerance with ADLs.     Baseline  11/19: 40/80    Time  4    Period  Weeks    Status  New      PT LONG TERM GOAL #  4   Title  Patient will ambulate with equal weight acceptance upon L and R LE's to demonstrate improved quality of movement    Baseline  decreased weight acceptance upon RLE>     Time  4     Period  Weeks    Status  New      PT LONG TERM GOAL #5   Title  Patient will ambulate 1644 ft with 6 minute walk test to meet age norm for return to previous level of function.     Baseline  11/21: 1080 ft    Time  4    Period  Weeks    Status  New            Plan - 07/06/17 1434    Clinical Impression Statement  Patient ambulated 1080 ft with 6 min walk test demonstrating ability to community ambulate, however not yet at age norm average of 1644 ft. Patient received ultrasound and e-stim with noted pain decrease after applied. Patient will continue to benefit from skilled physical therapy services to decrease pain, decrease swelling, and improve mobility for increased QOL.     Rehab Potential  Fair    Clinical Impairments Affecting Rehab Potential  hematoma, DVTs, GERD, thyroid    PT Frequency  2x / week    PT Duration  4 weeks    PT Treatment/Interventions  ADLs/Self Care Home Management;Electrical Stimulation;Cryotherapy;Biofeedback;Iontophoresis 4mg /ml Dexamethasone;Moist Heat;Ultrasound;Parrafin;Therapeutic exercise;Therapeutic activities;Functional mobility training;Stair training;Gait training;DME Instruction;Balance training;Neuromuscular re-education;Patient/family education;Compression bandaging;Manual techniques;Passive range of motion;Dry needling;Energy conservation;Visual/perceptual remediation/compensation    PT Next Visit Plan  ultrasound 3.3 mHz, 2 cycles 50% had warmer optional, intensity 1.5w/cm. Estim-polarity positive frequency 100 pulses per second. Cycle time 5 on 5 off continuous sweep. Ramp 2 seconds 100 -115 volts (maximum is 500 volts) to right leg    Consulted and Agree with Plan of Care  Patient       Patient will benefit from skilled therapeutic intervention in order to improve the following deficits and impairments:  Abnormal gait, Decreased activity tolerance, Decreased balance, Decreased endurance, Decreased coordination, Decreased mobility, Decreased  range of motion, Increased edema, Difficulty walking, Decreased strength, Impaired perceived functional ability, Impaired flexibility, Improper body mechanics, Postural dysfunction, Pain  Visit Diagnosis: Pain in right lower leg  Other abnormalities of gait and mobility     Problem List Patient Active Problem List   Diagnosis Date Noted  . Chronic sinusitis with recurrent bronchitis 02/03/2016  . Phlebitis and thrombophlebitis of femoral vein ([place]) 01/26/2011  . 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

## 2017-07-06 NOTE — Patient Outreach (Signed)
[facility]) Care Management  07/06/2017  AYO GUARINO 1946-08-04 [id number]   Transition of care  Referral date: 07/06/17 Referral source: status post discharge from [facility] on 07/03/17 Insurance: Health team advantage Attempt #1  Telephone call to patient for transition of care follow up. Contact answer phone states, "please take patient off of the call list. " RNCM contacted patients primary MD office and left message with nurse requesting assisting with engaging patient to [facility] care management services.   PLAN: RNCM will refer patient to care management assistant to close due to refusal of services.   RNCM will send notification of closure to patient primary MD  Quinn Plowman RN,BSN,CCM [facility] Telephonic  [phone]

## 2017-07-11 DIAGNOSIS — I8310 Varicose veins of unspecified lower extremity with inflammation: Secondary | ICD-10-CM | POA: Diagnosis not present

## 2017-07-11 DIAGNOSIS — Z09 Encounter for follow-up examination after completed treatment for conditions other than malignant neoplasm: Secondary | ICD-10-CM | POA: Diagnosis not present

## 2017-07-12 ENCOUNTER — Ambulatory Visit: Payer: PPO

## 2017-07-12 DIAGNOSIS — M79661 Pain in right lower leg: Secondary | ICD-10-CM

## 2017-07-12 DIAGNOSIS — R2689 Other abnormalities of gait and mobility: Secondary | ICD-10-CM

## 2017-07-12 NOTE — Therapy (Signed)
[place] MAIN [facility] SERVICES [address] [place], [place], [address] Phone: [phone]   Fax:  [phone]  Physical Therapy Treatment  Patient Details  Name: Marissa Wilson MRN: [id number] Date of Birth: [birth year] Referring Provider: Thomasene Mohair   Encounter Date: 07/12/2017  PT End of Session - 07/12/17 1538    Visit Number  3    Number of Visits  8    Date for PT Re-Evaluation  08/01/17    Authorization - Visit Number  3    Authorization - Number of Visits  10    PT Start Time  3716    PT Stop Time  1345    PT Time Calculation (min)  50 min    Equipment Utilized During Treatment  Gait belt    Activity Tolerance  Patient tolerated treatment well;Treatment limited secondary to medical complications (Comment)    Behavior During Therapy  [facility] for tasks assessed/performed       Past Medical History:  Diagnosis Date  . Asthma   . Gastrointestinal disorder   . GERD (gastroesophageal reflux disease)   . Hyperlipidemia, unspecified   . Hypertension   . Hypothyroid   . Osteoarthritis   . Sleep apnea   . Thyroid disease   . UTI (urinary tract infection)     Past Surgical History:  Procedure Laterality Date  . ABDOMINAL HYSTERECTOMY    . PR ENDOVENOUS LASTER, 1ST VEIN Right 06/09/2017   Procedure: EVLA of Right GSV; Surgeon: Lauretta Grill, MD; Location: MAIN OR [facility]; Service: Vascular  . PR LIGATN LONG SAPHENOUS VEIN AT SEPH-FEM JUNC Right 06/09/2017   Procedure: LIGATION & DIVISION OF LONG SAPHENOUS VEIN AT SAPHENOFEMORAL JUNCTION, OR DISTAL INTERRUPTIONS; Surgeon: Lauretta Grill, MD; Location: MAIN OR [facility]; Service: Vascular  . PR PHLEB VEINS EXTREM TO 20 Right 06/09/2017   Procedure: STAB PHLEBECTOMY OF VARICOSE VEINS, 1 EXTREMITY: 10-20 STAB INCISIONS; Surgeon: Lauretta Grill, MD; Location: MAIN OR [facility]; Service: Vascular    There were no vitals filed for this visit.   Subjective  Assessment - 07/12/17 1258    Subjective  Patient wearing compression socks. Saw doctor on Monday and had the MRI, has not been read yet. Night pain is the worst, has been walking a lot    Pertinent History  Patient had surgery on October 25th to remove veins in legs, tied saphenous leg off in upper thigh and removed deeper bulging veins. Pt. Stayed overnight at [facility] due to blood clot history. Went home the next day and on Sunday the 28th I had excruciating pain in R calf. Doppler performed on 29th and found a hematoma and placed in hospital for a week. Left hospital and went to [facility]. Had PT for walking for a week. Has a rollater to use in public but has started decreasing use in home. Right calf is wrapped and painful upon posterior calf  Muscular contraction. .    Limitations  Sitting;Lifting;Standing;Walking;House hold activities    How long can you sit comfortably?  30 minutes    How long can you stand comfortably?  10 minutes    How long can you walk comfortably?  when standing    Diagnostic tests  Doppler    Patient Stated Goals  Decrease pain and swelling and improve quality of walking.     Currently in Pain?  Yes    Pain Score  6     Pain Location  Calf    Pain  Orientation  Right    Pain Descriptors / Indicators  Tightness    Pain Type  Acute pain;Surgical pain    Pain Onset  1 to 4 weeks ago        High knee marches in // bars 8x length of bars, cues for raising R hip  Step over and back orange hurdle 10x each side, occasional unsteadiness when performing single limb on RLE.   Opp arm opp leg seated 10, 9, 8, ... 2, 1, 10 reciprocal 1's.   Ambulating 90 ft around gym x 2 cues for lifting RLE.    ultrasound 3.3 mHz, 2 cycles 50% had warmer optional, intensity 1.5w/cm.    Estim-polarity positive frequency 100 pulses per second. Cycle time 5 on 5 off continuous sweep. Ramp 2 seconds 100 -115 volts (maximum is 500 volts) to right leg  ' Pt. And husband educated on how to  perform E-stim parameters on home device. Demonstrated understanding, given sheet with correct parameters   Pt. response to medical necessity:  Patient will continue to benefit from skilled physical therapy services to decrease pain, decrease swelling, and improve mobility for increased QOL.    Circumference  R 31 L 28                     PT Education - 07/12/17 1538    Education provided  Yes    Education Details  E stim on home machine    Person(s) Educated  Patient;Spouse    Methods  Explanation;Demonstration;Handout    Comprehension  Verbalized understanding;Returned demonstration       PT Short Term Goals - 07/04/17 2111      PT SHORT TERM GOAL #1   Title  Patient will increase 10 meter walk test to >1.8m/s as to improve gait speed for better community ambulation and to reduce fall risk.    Baseline  11/19: .77 m/s     Time  2    Period  Weeks    Status  New    Target Date  07/18/17      PT SHORT TERM GOAL #2   Title  Patient will increase ABC scale score >60% to demonstrate better functional mobility and better confidence with ADLs.     Baseline  11/19: 53.1%    Time  2    Period  Weeks    Status  New    Target Date  07/18/17      PT SHORT TERM GOAL #3   Title  Patient will have circumfrential measurements of calves within 1 cm of each other for decreased swelling and pain relief    Baseline  R 32.5 L 28    Time  2    Period  Weeks    Status  New    Target Date  07/18/17        PT Long Term Goals - 07/06/17 1435      PT LONG TERM GOAL #1   Title  Patient will increase ABC scale score >80% to demonstrate better functional mobility and better confidence with ADLs.     Baseline  11/19:  53.1%    Time  4    Period  Weeks    Status  New      PT LONG TERM GOAL #2   Title  Patient will report a worst pain of 3/10 on VAS in  R calf to improve tolerance with ADLs and reduced symptoms with activities.  Baseline  11/19: 7/10 pain    Time  4     Period  Weeks    Status  New      PT LONG TERM GOAL #3   Title  Patient will increase lower extremity functional scale to >60/80 to demonstrate improved functional mobility and increased tolerance with ADLs.     Baseline  11/19: 40/80    Time  4    Period  Weeks    Status  New      PT LONG TERM GOAL #4   Title  Patient will ambulate with equal weight acceptance upon L and R LE's to demonstrate improved quality of movement    Baseline  decreased weight acceptance upon RLE>     Time  4    Period  Weeks    Status  New      PT LONG TERM GOAL #5   Title  Patient will ambulate 1644 ft with 6 minute walk test to meet age norm for return to previous level of function.     Baseline  11/21: 1080 ft    Time  4    Period  Weeks    Status  New            Plan - 07/12/17 1542    Clinical Impression Statement  Patient demonstrates decreased R calf swelling with circumference measurement (R 31 cm now, was 32.5 cm). Patient and husband educated on how to perform E stim parameters on home device. Both demonstrated understanding and were given handout of parameters to implement. Pt requires frequent cueing to lift RLE and is challenged by reciprocal motion with combined arms and legs.  Patient will continue to benefit from skilled physical therapy services to decrease pain, decrease swelling, and improve mobility for increased QOL.     Rehab Potential  Fair    Clinical Impairments Affecting Rehab Potential  hematoma, DVTs, GERD, thyroid    PT Frequency  2x / week    PT Duration  4 weeks    PT Treatment/Interventions  ADLs/Self Care Home Management;Electrical Stimulation;Cryotherapy;Biofeedback;Iontophoresis 4mg /ml Dexamethasone;Moist Heat;Ultrasound;Parrafin;Therapeutic exercise;Therapeutic activities;Functional mobility training;Stair training;Gait training;DME Instruction;Balance training;Neuromuscular re-education;Patient/family education;Compression bandaging;Manual techniques;Passive range  of motion;Dry needling;Energy conservation;Visual/perceptual remediation/compensation    PT Next Visit Plan  ultrasound 3.3 mHz, 2 cycles 50% had warmer optional, intensity 1.5w/cm. Estim-polarity positive frequency 100 pulses per second. Cycle time 5 on 5 off continuous sweep. Ramp 2 seconds 100 -115 volts (maximum is 500 volts) to right leg    Consulted and Agree with Plan of Care  Patient       Patient will benefit from skilled therapeutic intervention in order to improve the following deficits and impairments:  Abnormal gait, Decreased activity tolerance, Decreased balance, Decreased endurance, Decreased coordination, Decreased mobility, Decreased range of motion, Increased edema, Difficulty walking, Decreased strength, Impaired perceived functional ability, Impaired flexibility, Improper body mechanics, Postural dysfunction, Pain  Visit Diagnosis: Pain in right lower leg  Other abnormalities of gait and mobility     Problem List Patient Active Problem List   Diagnosis Date Noted  . Chronic sinusitis with recurrent bronchitis 02/03/2016  . Phlebitis and thrombophlebitis of femoral vein ([place]) 01/26/2011  . 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 [place], [place], [address] Phone: [phone]   Fax:  [phone]  Name: SOPHI CALLIGAN MRN: [id number] Date  of Birth: 11-Sep-1945

## 2017-07-14 ENCOUNTER — Ambulatory Visit: Payer: PPO

## 2017-07-14 DIAGNOSIS — R2689 Other abnormalities of gait and mobility: Secondary | ICD-10-CM

## 2017-07-14 DIAGNOSIS — M79661 Pain in right lower leg: Secondary | ICD-10-CM

## 2017-07-14 NOTE — Therapy (Signed)
[place] MAIN [facility] SERVICES [address] [place], [place], [address] Phone: [phone]   Fax:  [phone]  Physical Therapy Treatment  Patient Details  Name: Marissa Wilson MRN: [id number] Date of Birth: 16-Oct-1945 Referring Provider: Thomasene Mohair   Encounter Date: 07/14/2017  PT End of Session - 07/14/17 1624    Visit Number  4    Number of Visits  8    Date for PT Re-Evaluation  08/01/17    Authorization - Visit Number  4    Authorization - Number of Visits  10    PT Start Time  1300    PT Stop Time  1345    PT Time Calculation (min)  45 min    Equipment Utilized During Treatment  Gait belt    Activity Tolerance  Patient tolerated treatment well;Treatment limited secondary to medical complications (Comment)    Behavior During Therapy  [facility] for tasks assessed/performed       Past Medical History:  Diagnosis Date  . Asthma   . Gastrointestinal disorder   . GERD (gastroesophageal reflux disease)   . Hyperlipidemia, unspecified   . Hypertension   . Hypothyroid   . Osteoarthritis   . Sleep apnea   . Thyroid disease   . UTI (urinary tract infection)     Past Surgical History:  Procedure Laterality Date  . ABDOMINAL HYSTERECTOMY    . PR ENDOVENOUS LASTER, 1ST VEIN Right 06/09/2017   Procedure: EVLA of Right GSV; Surgeon: Lauretta Grill, MD; Location: MAIN OR [facility]; Service: Vascular  . PR LIGATN LONG SAPHENOUS VEIN AT SEPH-FEM JUNC Right 06/09/2017   Procedure: LIGATION & DIVISION OF LONG SAPHENOUS VEIN AT SAPHENOFEMORAL JUNCTION, OR DISTAL INTERRUPTIONS; Surgeon: Lauretta Grill, MD; Location: MAIN OR [facility]; Service: Vascular  . PR PHLEB VEINS EXTREM TO 20 Right 06/09/2017   Procedure: STAB PHLEBECTOMY OF VARICOSE VEINS, 1 EXTREMITY: 10-20 STAB INCISIONS; Surgeon: Lauretta Grill, MD; Location: MAIN [facility]; Service: Vascular    There were no vitals filed for this visit.  Subjective  Assessment - 07/14/17 1304    Subjective  Pt. decorated house yesterday, Pain increased 7/10.     Pertinent History  Patient had surgery on October 25th to remove veins in legs, tied saphenous leg off in upper thigh and removed deeper bulging veins. Pt. Stayed overnight at [facility] due to blood clot history. Went home the next day and on Sunday the 28th I had excruciating pain in R calf. Doppler performed on 29th and found a hematoma and placed in hospital for a week. Left hospital and went to [facility]. Had PT for walking for a week. Has a rollater to use in public but has started decreasing use in home. Right calf is wrapped and painful upon posterior calf  Muscular contraction. .    Limitations  Sitting;Lifting;Standing;Walking;House hold activities    How long can you sit comfortably?  30 minutes    How long can you stand comfortably?  10 minutes    How long can you walk comfortably?  when standing    Diagnostic tests  Doppler    Patient Stated Goals  Decrease pain and swelling and improve quality of walking.     Currently in Pain?  Yes    Pain Score  5     Pain Location  Calf    Pain Orientation  Right    Pain Descriptors / Indicators  Tightness    Pain Type  Acute pain;Surgical  pain    Pain Onset  1 to 4 weeks ago      seated hip abduction GTB 15x with 3 second holds   resisted pf GTB 2x10 each leg  Resisted df GTB 2x10 each leg, slight ache of RLE  Ambulating 90 ft around gym x 2 cues for lifting RLE.   Airex pad: stand with eyes open and eyes closed,  airex pad: head turns horizontal and vertical no LOB   Airex pad: balloon taps with CGA 100x, no LOB, reach within and outside of BOS   Donning/doffing compression garment  ultrasound 3.3 mHz, 2 cycles 50% had warmer optional, intensity 1.5w/cm.  Pt. And husband educated on how to perform E-stim parameters on home device. Demonstrated understanding, given sheet with correct parameters    Pt. response to medical necessity:   Patient will continue to benefit from skilled physical therapy services to decrease pain, decrease swelling, and improve mobility for increased QOL                    PT Education - 07/14/17 1623    Education provided  Yes    Education Details  making sure to rest between working on decorating to prevent increased pain/swelling    Person(s) Educated  Patient    Methods  Explanation;Demonstration;Verbal cues    Comprehension  Verbalized understanding;Returned demonstration       PT Short Term Goals - 07/04/17 2111      PT SHORT TERM GOAL #1   Title  Patient will increase 10 meter walk test to >1.59m/s as to improve gait speed for better community ambulation and to reduce fall risk.    Baseline  11/19: .77 m/s     Time  2    Period  Weeks    Status  New    Target Date  07/18/17      PT SHORT TERM GOAL #2   Title  Patient will increase ABC scale score >60% to demonstrate better functional mobility and better confidence with ADLs.     Baseline  11/19: 53.1%    Time  2    Period  Weeks    Status  New    Target Date  07/18/17      PT SHORT TERM GOAL #3   Title  Patient will have circumfrential measurements of calves within 1 cm of each other for decreased swelling and pain relief    Baseline  R 32.5 L 28    Time  2    Period  Weeks    Status  New    Target Date  07/18/17        PT Long Term Goals - 07/06/17 1435      PT LONG TERM GOAL #1   Title  Patient will increase ABC scale score >80% to demonstrate better functional mobility and better confidence with ADLs.     Baseline  11/19:  53.1%    Time  4    Period  Weeks    Status  New      PT LONG TERM GOAL #2   Title  Patient will report a worst pain of 3/10 on VAS in  R calf to improve tolerance with ADLs and reduced symptoms with activities.     Baseline  11/19: 7/10 pain    Time  4    Period  Weeks    Status  New      PT LONG TERM GOAL #3   Title  Patient will increase lower extremity functional  scale to >60/80 to demonstrate improved functional mobility and increased tolerance with ADLs.     Baseline  11/19: 40/80    Time  4    Period  Weeks    Status  New      PT LONG TERM GOAL #4   Title  Patient will ambulate with equal weight acceptance upon L and R LE's to demonstrate improved quality of movement    Baseline  decreased weight acceptance upon RLE>     Time  4    Period  Weeks    Status  New      PT LONG TERM GOAL #5   Title  Patient will ambulate 1644 ft with 6 minute walk test to meet age norm for return to previous level of function.     Baseline  11/21: 1080 ft    Time  4    Period  Weeks    Status  New            Plan - 07/14/17 1626    Clinical Impression Statement  Patient demonstrated improved stability and control of LE's on unstable surfaces. Pt able to maintain COM when reaching outside of BOS and across body. Gait mechanics improved with verbal cueing of lifting of LE's for improved step height. Patient will continue to benefit from skilled physical therapy services to decrease pain, decrease swelling, and improve mobility for improved QOL.     Rehab Potential  Fair    Clinical Impairments Affecting Rehab Potential  hematoma, DVTs, GERD, thyroid    PT Frequency  2x / week    PT Duration  4 weeks    PT Treatment/Interventions  ADLs/Self Care Home Management;Electrical Stimulation;Cryotherapy;Biofeedback;Iontophoresis 4mg /ml Dexamethasone;Moist Heat;Ultrasound;Parrafin;Therapeutic exercise;Therapeutic activities;Functional mobility training;Stair training;Gait training;DME Instruction;Balance training;Neuromuscular re-education;Patient/family education;Compression bandaging;Manual techniques;Passive range of motion;Dry needling;Energy conservation;Visual/perceptual remediation/compensation    PT Next Visit Plan  ultrasound 3.3 mHz, 2 cycles 50% had warmer optional, intensity 1.5w/cm. Estim-polarity positive frequency 100 pulses per second. Cycle time 5 on 5  off continuous sweep. Ramp 2 seconds 100 -115 volts (maximum is 500 volts) to right leg    Consulted and Agree with Plan of Care  Patient       Patient will benefit from skilled therapeutic intervention in order to improve the following deficits and impairments:  Abnormal gait, Decreased activity tolerance, Decreased balance, Decreased endurance, Decreased coordination, Decreased mobility, Decreased range of motion, Increased edema, Difficulty walking, Decreased strength, Impaired perceived functional ability, Impaired flexibility, Improper body mechanics, Postural dysfunction, Pain  Visit Diagnosis: Pain in right lower leg  Other abnormalities of gait and mobility     Problem List Patient Active Problem List   Diagnosis Date Noted  . Chronic sinusitis with recurrent bronchitis 02/03/2016  . Phlebitis and thrombophlebitis of femoral vein ([place]) 01/26/2011  . 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 [place], [place], [address] Phone: [phone]   Fax:  [phone]  Name: Marissa Wilson MRN: [id number] Date of Birth: 11-19-1945

## 2017-07-21 ENCOUNTER — Ambulatory Visit: Payer: PPO | Attending: Vascular Surgery

## 2017-07-21 DIAGNOSIS — M79661 Pain in right lower leg: Secondary | ICD-10-CM

## 2017-07-21 DIAGNOSIS — I83812 Varicose veins of left lower extremities with pain: Secondary | ICD-10-CM | POA: Diagnosis not present

## 2017-07-21 DIAGNOSIS — Z86718 Personal history of other venous thrombosis and embolism: Secondary | ICD-10-CM | POA: Diagnosis not present

## 2017-07-21 DIAGNOSIS — M79604 Pain in right leg: Secondary | ICD-10-CM | POA: Diagnosis not present

## 2017-07-21 DIAGNOSIS — I8010 Phlebitis and thrombophlebitis of unspecified femoral vein: Secondary | ICD-10-CM | POA: Diagnosis not present

## 2017-07-21 DIAGNOSIS — R2689 Other abnormalities of gait and mobility: Secondary | ICD-10-CM | POA: Insufficient documentation

## 2017-07-21 DIAGNOSIS — Z7901 Long term (current) use of anticoagulants: Secondary | ICD-10-CM | POA: Diagnosis not present

## 2017-07-21 NOTE — Therapy (Signed)
[place] MAIN [facility] SERVICES [address] [place], [place], [address] Phone: [phone]   Fax:  [phone]  Physical Therapy Treatment  Patient Details  Name: Marissa Wilson MRN: [id number] Date of Birth: [birth year] Referring Provider: Thomasene Mohair   Encounter Date: 07/21/2017  PT End of Session - 07/21/17 1737    Visit Number  5    Number of Visits  8    Date for PT Re-Evaluation  08/01/17    Authorization - Visit Number  5    Authorization - Number of Visits  10    PT Start Time  7253    PT Stop Time  1730    PT Time Calculation (min)  45 min    Equipment Utilized During Treatment  Gait belt    Activity Tolerance  Patient tolerated treatment well;Treatment limited secondary to medical complications (Comment)    Behavior During Therapy  [facility] for tasks assessed/performed       Past Medical History:  Diagnosis Date  . Asthma   . Gastrointestinal disorder   . GERD (gastroesophageal reflux disease)   . Hyperlipidemia, unspecified   . Hypertension   . Hypothyroid   . Osteoarthritis   . Sleep apnea   . Thyroid disease   . UTI (urinary tract infection)     Past Surgical History:  Procedure Laterality Date  . ABDOMINAL HYSTERECTOMY    . PR ENDOVENOUS LASTER, 1ST VEIN Right 06/09/2017   Procedure: EVLA of Right GSV; Surgeon: Lauretta Grill, MD; Location: MAIN OR [facility]; Service: Vascular  . PR LIGATN LONG SAPHENOUS VEIN AT SEPH-FEM JUNC Right 06/09/2017   Procedure: LIGATION & DIVISION OF LONG SAPHENOUS VEIN AT SAPHENOFEMORAL JUNCTION, OR DISTAL INTERRUPTIONS; Surgeon: Lauretta Grill, MD; Location: MAIN OR [facility]; Service: Vascular  . PR PHLEB VEINS EXTREM TO 20 Right 06/09/2017   Procedure: STAB PHLEBECTOMY OF VARICOSE VEINS, 1 EXTREMITY: 10-20 STAB INCISIONS; Surgeon: Lauretta Grill, MD; Location: MAIN OR [facility]; Service: Vascular    There were no vitals filed for this visit.  Subjective Assessment  - 07/21/17 1648    Subjective  Patient went to doctor this morning, they said the hematoma has decreased. Has been moving around a lot due to the holidays     Pertinent History  Patient had surgery on October 25th to remove veins in legs, tied saphenous leg off in upper thigh and removed deeper bulging veins. Pt. Stayed overnight at [facility] due to blood clot history. Went home the next day and on Sunday the 28th I had excruciating pain in R calf. Doppler performed on 29th and found a hematoma and placed in hospital for a week. Left hospital and went to [facility]. Had PT for walking for a week. Has a rollater to use in public but has started decreasing use in home. Right calf is wrapped and painful upon posterior calf  Muscular contraction. .    Limitations  Sitting;Lifting;Standing;Walking;House hold activities    How long can you sit comfortably?  30 minutes    How long can you stand comfortably?  10 minutes    How long can you walk comfortably?  when standing    Diagnostic tests  Doppler    Patient Stated Goals  Decrease pain and swelling and improve quality of walking.     Currently in Pain?  Yes    Pain Score  5     Pain Location  Calf    Pain Orientation  Right  Pain Descriptors / Indicators  Aching;Tightness    Pain Type  Acute pain;Surgical pain    Pain Onset  1 to 4 weeks ago          resisted pf GTB 2x10 each leg   Resisted df GTB 2x10 each leg, slight ache of RLE   Ambulating 90 ft around gym  cues for lifting RLE.   Ambulating in hallway with horizontal head turns to read playing cards, occasional cueing for heel strike of RLE.   Airex pad: eyes closed 60 seconds no LOB   Airex pad: balloon taps with CGA 100x, no LOB, reach within and outside of BOS  Step over and back hurdle without UE support 12 x each leg  Stand with LLE on green disc to encourage weight shift onto RLE.     Donning/doffing compression garment   ultrasound 3.3 mHz, 2 cycles 50% had warmer optional,  intensity 1.5w/cm.       Pt. response to medical necessity:  Patient will continue to benefit from skilled physical therapy services to decrease pain, decrease swelling, and improve mobility for increased QOL                      PT Education - 07/21/17 1736    Education provided  Yes    Education Details  weight shift onto RLE    Person(s) Educated  Patient;Spouse    Methods  Explanation;Demonstration;Verbal cues    Comprehension  Returned demonstration;Verbalized understanding;Verbal cues required       PT Short Term Goals - 07/04/17 2111      PT SHORT TERM GOAL #1   Title  Patient will increase 10 meter walk test to >1.27m/s as to improve gait speed for better community ambulation and to reduce fall risk.    Baseline  11/19: .77 m/s     Time  2    Period  Weeks    Status  New    Target Date  07/18/17      PT SHORT TERM GOAL #2   Title  Patient will increase ABC scale score >60% to demonstrate better functional mobility and better confidence with ADLs.     Baseline  11/19: 53.1%    Time  2    Period  Weeks    Status  New    Target Date  07/18/17      PT SHORT TERM GOAL #3   Title  Patient will have circumfrential measurements of calves within 1 cm of each other for decreased swelling and pain relief    Baseline  R 32.5 L 28    Time  2    Period  Weeks    Status  New    Target Date  07/18/17        PT Long Term Goals - 07/06/17 1435      PT LONG TERM GOAL #1   Title  Patient will increase ABC scale score >80% to demonstrate better functional mobility and better confidence with ADLs.     Baseline  11/19:  53.1%    Time  4    Period  Weeks    Status  New      PT LONG TERM GOAL #2   Title  Patient will report a worst pain of 3/10 on VAS in  R calf to improve tolerance with ADLs and reduced symptoms with activities.     Baseline  11/19: 7/10 pain    Time  4  Period  Weeks    Status  New      PT LONG TERM GOAL #3   Title  Patient will  increase lower extremity functional scale to >60/80 to demonstrate improved functional mobility and increased tolerance with ADLs.     Baseline  11/19: 40/80    Time  4    Period  Weeks    Status  New      PT LONG TERM GOAL #4   Title  Patient will ambulate with equal weight acceptance upon L and R LE's to demonstrate improved quality of movement    Baseline  decreased weight acceptance upon RLE>     Time  4    Period  Weeks    Status  New      PT LONG TERM GOAL #5   Title  Patient will ambulate 1644 ft with 6 minute walk test to meet age norm for return to previous level of function.     Baseline  11/21: 1080 ft    Time  4    Period  Weeks    Status  New            Plan - 07/21/17 1737    Clinical Impression Statement  Patient demonstrates improved mobility and ambulation with decreased cueing for arm swing and heel strike required. Weight shifting onto RLE is challenging to patient and required external cueing of placing LLE on unstable higher surface to induce cueing of weight shift.  Patient will continue to benefit from skilled physical therapy services to decrease pain, decrease swelling, and improve mobility for increased QOL    Rehab Potential  Fair    Clinical Impairments Affecting Rehab Potential  hematoma, DVTs, GERD, thyroid    PT Frequency  2x / week    PT Duration  4 weeks    PT Treatment/Interventions  ADLs/Self Care Home Management;Electrical Stimulation;Cryotherapy;Biofeedback;Iontophoresis 4mg /ml Dexamethasone;Moist Heat;Ultrasound;Parrafin;Therapeutic exercise;Therapeutic activities;Functional mobility training;Stair training;Gait training;DME Instruction;Balance training;Neuromuscular re-education;Patient/family education;Compression bandaging;Manual techniques;Passive range of motion;Dry needling;Energy conservation;Visual/perceptual remediation/compensation    PT Next Visit Plan  ultrasound 3.3 mHz, 2 cycles 50% had warmer optional, intensity 1.5w/cm.  Estim-polarity positive frequency 100 pulses per second. Cycle time 5 on 5 off continuous sweep. Ramp 2 seconds 100 -115 volts (maximum is 500 volts) to right leg    Consulted and Agree with Plan of Care  Patient       Patient will benefit from skilled therapeutic intervention in order to improve the following deficits and impairments:  Abnormal gait, Decreased activity tolerance, Decreased balance, Decreased endurance, Decreased coordination, Decreased mobility, Decreased range of motion, Increased edema, Difficulty walking, Decreased strength, Impaired perceived functional ability, Impaired flexibility, Improper body mechanics, Postural dysfunction, Pain  Visit Diagnosis: Pain in right lower leg  Other abnormalities of gait and mobility     Problem List Patient Active Problem List   Diagnosis Date Noted  . Chronic sinusitis with recurrent bronchitis 02/03/2016  . Phlebitis and thrombophlebitis of femoral vein ([place]) 01/26/2011  . 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

## 2017-07-26 ENCOUNTER — Ambulatory Visit: Payer: PPO

## 2017-07-26 DIAGNOSIS — M79661 Pain in right lower leg: Secondary | ICD-10-CM

## 2017-07-26 DIAGNOSIS — R2689 Other abnormalities of gait and mobility: Secondary | ICD-10-CM

## 2017-07-26 NOTE — Therapy (Signed)
[place] MAIN [facility] SERVICES [address] [place], [place], [address] Phone: [phone]   Fax:  [phone]  Physical Therapy Treatment  Patient Details  Name: Marissa Wilson MRN: [id number] Date of Birth: 27-Sep-1945 Referring Provider: Thomasene Mohair   Encounter Date: 07/26/2017  PT End of Session - 07/26/17 1049    Visit Number  6    Number of Visits  8    Date for PT Re-Evaluation  08/01/17    Authorization - Visit Number  6    Authorization - Number of Visits  10    PT Start Time  1100    PT Stop Time  1145    PT Time Calculation (min)  45 min    Equipment Utilized During Treatment  Gait belt    Activity Tolerance  Patient tolerated treatment well;Treatment limited secondary to medical complications (Comment)    Behavior During Therapy  [facility] for tasks assessed/performed       Past Medical History:  Diagnosis Date  . Asthma   . Gastrointestinal disorder   . GERD (gastroesophageal reflux disease)   . Hyperlipidemia, unspecified   . Hypertension   . Hypothyroid   . Osteoarthritis   . Sleep apnea   . Thyroid disease   . UTI (urinary tract infection)     Past Surgical History:  Procedure Laterality Date  . ABDOMINAL HYSTERECTOMY    . PR ENDOVENOUS LASTER, 1ST VEIN Right 06/09/2017   Procedure: EVLA of Right GSV; Surgeon: Lauretta Grill, MD; Location: MAIN OR [facility]; Service: Vascular  . PR LIGATN LONG SAPHENOUS VEIN AT SEPH-FEM JUNC Right 06/09/2017   Procedure: LIGATION & DIVISION OF LONG SAPHENOUS VEIN AT SAPHENOFEMORAL JUNCTION, OR DISTAL INTERRUPTIONS; Surgeon: Lauretta Grill, MD; Location: MAIN OR [facility]; Service: Vascular  . PR PHLEB VEINS EXTREM TO 20 Right 06/09/2017   Procedure: STAB PHLEBECTOMY OF VARICOSE VEINS, 1 EXTREMITY: 10-20 STAB INCISIONS; Surgeon: Lauretta Grill, MD; Location: MAIN OR [facility]; Service: Vascular    There were no vitals filed for this visit.  Subjective  Assessment - 07/26/17 1101    Subjective  Pt's pain is limiting sleep, pain in calf and in sciatic nerve. Past four nights has woken up every 2 hours.     Pertinent History  Patient had surgery on October 25th to remove veins in legs, tied saphenous leg off in upper thigh and removed deeper bulging veins. Pt. Stayed overnight at [facility] due to blood clot history. Went home the next day and on Sunday the 28th I had excruciating pain in R calf. Doppler performed on 29th and found a hematoma and placed in hospital for a week. Left hospital and went to [facility]. Had PT for walking for a week. Has a rollater to use in public but has started decreasing use in home. Right calf is wrapped and painful upon posterior calf  Muscular contraction. .    Limitations  Sitting;Lifting;Standing;Walking;House hold activities    How long can you sit comfortably?  30 minutes    How long can you stand comfortably?  10 minutes    How long can you walk comfortably?  when standing    Diagnostic tests  Doppler    Patient Stated Goals  Decrease pain and swelling and improve quality of walking.     Currently in Pain?  Yes    Pain Score  5     Pain Location  Calf    Pain Orientation  Right  Pain Descriptors / Indicators  Aching;Tightness    Pain Type  Acute pain;Surgical pain    Pain Onset  1 to 4 weeks ago    Pain Frequency  Constant       resisted pf GTB 2x10 each leg   Resisted df GTB 2x10 each leg, slight ache of RLE   Ambulating 90 ft around gym  cues for lifting RLE.    Ambulating in hallway with horizontal head turns to read playing cards, occasional cueing for heel strike of RLE.    Airex pad balance beam: tandem walk 6x, side step 6x    Airex pad: tossing balls into basket 2x 20 balls  Standing on Bosu ball 2x 60 seconds   High knee marches in // bars 6x in // bars   High knee marching with toy soldier 8x in // bars    Donning/doffing compression garment   ultrasound 3.3 mHz, 2 cycles 50% had warmer  optional, intensity 1.5w/cm.                        PT Education - 07/26/17 1048    Education provided  Yes    Education Details  ambulatory mechanics and weight shifting    Person(s) Educated  Patient    Methods  Explanation;Demonstration;Verbal cues    Comprehension  Verbalized understanding;Returned demonstration       PT Short Term Goals - 07/04/17 2111      PT SHORT TERM GOAL #1   Title  Patient will increase 10 meter walk test to >1.48m/s as to improve gait speed for better community ambulation and to reduce fall risk.    Baseline  11/19: .77 m/s     Time  2    Period  Weeks    Status  New    Target Date  07/18/17      PT SHORT TERM GOAL #2   Title  Patient will increase ABC scale score >60% to demonstrate better functional mobility and better confidence with ADLs.     Baseline  11/19: 53.1%    Time  2    Period  Weeks    Status  New    Target Date  07/18/17      PT SHORT TERM GOAL #3   Title  Patient will have circumfrential measurements of calves within 1 cm of each other for decreased swelling and pain relief    Baseline  R 32.5 L 28    Time  2    Period  Weeks    Status  New    Target Date  07/18/17        PT Long Term Goals - 07/06/17 1435      PT LONG TERM GOAL #1   Title  Patient will increase ABC scale score >80% to demonstrate better functional mobility and better confidence with ADLs.     Baseline  11/19:  53.1%    Time  4    Period  Weeks    Status  New      PT LONG TERM GOAL #2   Title  Patient will report a worst pain of 3/10 on VAS in  R calf to improve tolerance with ADLs and reduced symptoms with activities.     Baseline  11/19: 7/10 pain    Time  4    Period  Weeks    Status  New      PT LONG TERM GOAL #3   Title  Patient will increase lower extremity functional scale to >60/80 to demonstrate improved functional mobility and increased tolerance with ADLs.     Baseline  11/19: 40/80    Time  4    Period  Weeks     Status  New      PT LONG TERM GOAL #4   Title  Patient will ambulate with equal weight acceptance upon L and R LE's to demonstrate improved quality of movement    Baseline  decreased weight acceptance upon RLE>     Time  4    Period  Weeks    Status  New      PT LONG TERM GOAL #5   Title  Patient will ambulate 1644 ft with 6 minute walk test to meet age norm for return to previous level of function.     Baseline  11/21: 1080 ft    Time  4    Period  Weeks    Status  New            Plan - 07/26/17 1156    Clinical Impression Statement  Patient improving with ability to weight shift over RLE with decreased need for reinforcement. R hip pain limiting and affecting gait mechanics at this time due to tightness and irritation. Swelling and pain decreased after ultrasound. Patient will continue to benefit from skilled physical therapy services to decrease pain, decrease swelling, and improve mobility for increased QOL.     Rehab Potential  Fair    Clinical Impairments Affecting Rehab Potential  hematoma, DVTs, GERD, thyroid    PT Frequency  2x / week    PT Duration  4 weeks    PT Treatment/Interventions  ADLs/Self Care Home Management;Electrical Stimulation;Cryotherapy;Biofeedback;Iontophoresis 4mg /ml Dexamethasone;Moist Heat;Ultrasound;Parrafin;Therapeutic exercise;Therapeutic activities;Functional mobility training;Stair training;Gait training;DME Instruction;Balance training;Neuromuscular re-education;Patient/family education;Compression bandaging;Manual techniques;Passive range of motion;Dry needling;Energy conservation;Visual/perceptual remediation/compensation    PT Next Visit Plan  RECERT    Consulted and Agree with Plan of Care  Patient       Patient will benefit from skilled therapeutic intervention in order to improve the following deficits and impairments:  Abnormal gait, Decreased activity tolerance, Decreased balance, Decreased endurance, Decreased coordination, Decreased  mobility, Decreased range of motion, Increased edema, Difficulty walking, Decreased strength, Impaired perceived functional ability, Impaired flexibility, Improper body mechanics, Postural dysfunction, Pain  Visit Diagnosis: Pain in right lower leg  Other abnormalities of gait and mobility     Problem List Patient Active Problem List   Diagnosis Date Noted  . Chronic sinusitis with recurrent bronchitis 02/03/2016  . Phlebitis and thrombophlebitis of femoral vein ([place]) 01/26/2011  . 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

## 2017-07-28 ENCOUNTER — Ambulatory Visit: Payer: PPO

## 2017-07-28 DIAGNOSIS — M79661 Pain in right lower leg: Secondary | ICD-10-CM | POA: Diagnosis not present

## 2017-07-28 DIAGNOSIS — R2689 Other abnormalities of gait and mobility: Secondary | ICD-10-CM

## 2017-07-28 NOTE — Therapy (Signed)
[place] MAIN [facility] SERVICES [address] [place], [place], [address] Phone: [phone]   Fax:  [phone]  Physical Therapy Treatment  Patient Details  Name: Marissa Wilson MRN: [id number] Date of Birth: 1945-09-07 Referring Provider: Thomasene Mohair   Encounter Date: 07/28/2017  PT End of Session - 07/28/17 1550    Visit Number  7    Number of Visits  16    Date for PT Re-Evaluation  08/25/17    Authorization Type  next will be 1/10    Authorization - Visit Number  7    Authorization - Number of Visits  10    PT Start Time  1300    PT Stop Time  1345    PT Time Calculation (min)  45 min    Equipment Utilized During Treatment  Gait belt    Activity Tolerance  Patient tolerated treatment well;Treatment limited secondary to medical complications (Comment)    Behavior During Therapy  [facility] for tasks assessed/performed       Past Medical History:  Diagnosis Date  . Asthma   . Gastrointestinal disorder   . GERD (gastroesophageal reflux disease)   . Hyperlipidemia, unspecified   . Hypertension   . Hypothyroid   . Osteoarthritis   . Sleep apnea   . Thyroid disease   . UTI (urinary tract infection)     Past Surgical History:  Procedure Laterality Date  . ABDOMINAL HYSTERECTOMY    . PR ENDOVENOUS LASTER, 1ST VEIN Right 06/09/2017   Procedure: EVLA of Right GSV; Surgeon: Lauretta Grill, MD; Location: MAIN OR [facility]; Service: Vascular  . PR LIGATN LONG SAPHENOUS VEIN AT SEPH-FEM JUNC Right 06/09/2017   Procedure: LIGATION & DIVISION OF LONG SAPHENOUS VEIN AT SAPHENOFEMORAL JUNCTION, OR DISTAL INTERRUPTIONS; Surgeon: Lauretta Grill, MD; Location: MAIN OR [facility]; Service: Vascular  . PR PHLEB VEINS EXTREM TO 20 Right 06/09/2017   Procedure: STAB PHLEBECTOMY OF VARICOSE VEINS, 1 EXTREMITY: 10-20 STAB INCISIONS; Surgeon: Lauretta Grill, MD; Location: MAIN OR [facility]; Service: Vascular    There were no vitals  filed for this visit.  Subjective Assessment - 07/28/17 1306    Subjective  Patient pain is decreasing, is feeling more mobile at home and in the community. Waking up at night still, taking tylenol PM which is helping decrease frequency to 2x/ night    Pertinent History  Patient had surgery on October 25th to remove veins in legs, tied saphenous leg off in upper thigh and removed deeper bulging veins. Pt. Stayed overnight at [facility] due to blood clot history. Went home the next day and on Sunday the 28th I had excruciating pain in R calf. Doppler performed on 29th and found a hematoma and placed in hospital for a week. Left hospital and went to [facility]. Had PT for walking for a week. Has a rollater to use in public but has started decreasing use in home. Right calf is wrapped and painful upon posterior calf  Muscular contraction. .    Limitations  Sitting;Lifting;Standing;Walking;House hold activities    How long can you sit comfortably?  30 minutes    How long can you stand comfortably?  10 minutes    How long can you walk comfortably?  when standing    Diagnostic tests  Doppler    Patient Stated Goals  Decrease pain and swelling and improve quality of walking.     Currently in Pain?  Yes    Pain Score  1  Pain Location  Calf    Pain Orientation  Right    Pain Descriptors / Indicators  Aching;Tightness    Pain Type  Acute pain;Surgical pain    Pain Onset  1 to 4 weeks ago    Pain Frequency  Constant          10MWT 7 sec=1.43 m/s  ABC=76.25% LEFS= 60/80 Circumfrential measurement : 28.5, L 24.5 6 minute walk test=1600 ft      ultrasound 3.3 mHz, 2 cycles 50% had warmer optional, intensity 1.5w/cm.                 PT Education - 07/28/17 1308    Education provided  Yes    Education Details  POC, goal progression, ambulatory mechanics    Person(s) Educated  Patient    Methods  Explanation;Demonstration;Verbal cues    Comprehension  Verbalized  understanding;Returned demonstration       PT Short Term Goals - 07/28/17 1313      PT SHORT TERM GOAL #1   Title  Patient will increase 10 meter walk test to >1.62ms as to improve gait speed for better community ambulation and to reduce fall risk.    Baseline  11/19: .77 m/s  12/13: 1.43 m/s    Time  2    Period  Weeks    Status  Achieved      PT SHORT TERM GOAL #2   Title  Patient will increase ABC scale score >60% to demonstrate better functional mobility and better confidence with ADLs.     Baseline  11/19: 53.1% 12/13: 76%    Time  2    Period  Weeks    Status  Achieved      PT SHORT TERM GOAL #3   Title  Patient will have circumfrential measurements of calves within 1 cm of each other for decreased swelling and pain relief    Baseline  R 32.5 L 28   R 28.5 L 24.5    Time  2    Period  Weeks    Status  Partially Met        PT Long Term Goals - 07/28/17 1311      PT LONG TERM GOAL #1   Title  Patient will increase ABC scale score >80% to demonstrate better functional mobility and better confidence with ADLs.     Baseline  11/19:  53.1% 12/13: 76.25%    Time  4    Period  Weeks    Status  Partially Met      PT LONG TERM GOAL #2   Title  Patient will report a worst pain of 3/10 on VAS in  R calf to improve tolerance with ADLs and reduced symptoms with activities.     Baseline  11/19: 7/10 pain, 12/13: worst pain 5/10     Time  4    Period  Weeks    Status  Partially Met      PT LONG TERM GOAL #3   Title  Patient will increase lower extremity functional scale to >60/80 to demonstrate improved functional mobility and increased tolerance with ADLs.     Baseline  11/19: 40/80 12/13: 60/80    Time  4    Period  Weeks    Status  Achieved      PT LONG TERM GOAL #4   Title  Patient will ambulate with equal weight acceptance upon L and R LE's to demonstrate improved quality of movement  Baseline  decreased weight acceptance upon RLE>, lateral weight shift on R to  lateral aspect of foot due to fear of activaiton of medial calf musculature.     Time  4    Period  Weeks    Status  Partially Met      PT LONG TERM GOAL #5   Title  Patient will ambulate 1644 ft with 6 minute walk test to meet age norm for return to previous level of function.     Baseline  11/21: 1080 ft 08-11-2023: 1600 ft     Time  4    Period  Weeks    Status  Partially Met            Plan - 08-10-17 1554    Clinical Impression Statement  Patient progressing towards all goals at this time. Increased ambulatory speed to 1.43 m/s was performed with ambulatory mechanics demonstrating lateral weight shift into supination due to fear of contracting medial calf musculature due to pain upon contraction. Patient 6 min walk test improved to 1600 ft with deteriorating gait mechanics towards end due to fatigue. ABC= 76.25%,  LEFS =60/80. R calf decreasing in volume with R= 28.5cm and L 24.5 cm. Patient will continue to benefit from skilled physical therapy to decrease pain, decrease swelling, and improve mobility for increased QOL.     Rehab Potential  Fair    Clinical Impairments Affecting Rehab Potential  hematoma, DVTs, GERD, thyroid    PT Frequency  2x / week    PT Duration  4 weeks    PT Treatment/Interventions  ADLs/Self Care Home Management;Electrical Stimulation;Cryotherapy;Biofeedback;Iontophoresis 1m/ml Dexamethasone;Moist Heat;Ultrasound;Parrafin;Therapeutic exercise;Therapeutic activities;Functional mobility training;Stair training;Gait training;DME Instruction;Balance training;Neuromuscular re-education;Patient/family education;Compression bandaging;Manual techniques;Passive range of motion;Dry needling;Energy conservation;Visual/perceptual remediation/compensation    PT Next Visit Plan  ultrasound, balance, recruiting medial calf musculature.     Consulted and Agree with Plan of Care  Patient       Patient will benefit from skilled therapeutic intervention in order to improve the  following deficits and impairments:  Abnormal gait, Decreased activity tolerance, Decreased balance, Decreased endurance, Decreased coordination, Decreased mobility, Decreased range of motion, Increased edema, Difficulty walking, Decreased strength, Impaired perceived functional ability, Impaired flexibility, Improper body mechanics, Postural dysfunction, Pain  Visit Diagnosis: Pain in right lower leg  Other abnormalities of gait and mobility   G-Codes - [id number]    Functional Assessment Tool Used (Outpatient Only)  10MWT, 5xSTS, ABC, 6 min walk test LEFS, measurement, clinical judgement, gait mechanics     Functional Limitation  Mobility: Walking and moving around    Mobility: Walking and Moving Around Current Status ([phone]  At least 1 percent but less than 20 percent impaired, limited or restricted    Mobility: Walking and Moving Around Goal Status ([phone]  At least 1 percent but less than 20 percent impaired, limited or restricted       Problem List Patient Active Problem List   Diagnosis Date Noted  . Chronic sinusitis with recurrent bronchitis 02/03/2016  . Phlebitis and thrombophlebitis of femoral vein ([place] 01/26/2011  . 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

## 2017-08-01 ENCOUNTER — Ambulatory Visit: Payer: PPO

## 2017-08-01 DIAGNOSIS — M79661 Pain in right lower leg: Secondary | ICD-10-CM

## 2017-08-01 DIAGNOSIS — R2689 Other abnormalities of gait and mobility: Secondary | ICD-10-CM

## 2017-08-01 DIAGNOSIS — Z09 Encounter for follow-up examination after completed treatment for conditions other than malignant neoplasm: Secondary | ICD-10-CM | POA: Diagnosis not present

## 2017-08-01 DIAGNOSIS — I8310 Varicose veins of unspecified lower extremity with inflammation: Secondary | ICD-10-CM | POA: Diagnosis not present

## 2017-08-01 NOTE — Therapy (Signed)
[place] MAIN [facility] SERVICES [address] [place], [place], [address] Phone: [phone]   Fax:  [phone]  Physical Therapy Treatment  Patient Details  Name: Marissa Wilson MRN: [id number] Date of Birth: [birth year] Referring Provider: Thomasene Mohair   Encounter Date: 08/01/2017  PT End of Session - 08/01/17 1447    Visit Number  8    Number of Visits  16    Date for PT Re-Evaluation  08/25/17    Authorization - Visit Number  1    Authorization - Number of Visits  10    PT Start Time  1430    PT Stop Time  1515    PT Time Calculation (min)  45 min    Equipment Utilized During Treatment  Gait belt    Activity Tolerance  Patient tolerated treatment well;Treatment limited secondary to medical complications (Comment)    Behavior During Therapy  [facility] for tasks assessed/performed       Past Medical History:  Diagnosis Date  . Asthma   . Gastrointestinal disorder   . GERD (gastroesophageal reflux disease)   . Hyperlipidemia, unspecified   . Hypertension   . Hypothyroid   . Osteoarthritis   . Sleep apnea   . Thyroid disease   . UTI (urinary tract infection)     Past Surgical History:  Procedure Laterality Date  . ABDOMINAL HYSTERECTOMY    . PR ENDOVENOUS LASTER, 1ST VEIN Right 06/09/2017   Procedure: EVLA of Right GSV; Surgeon: Lauretta Grill, MD; Location: MAIN OR [facility]; Service: Vascular  . PR LIGATN LONG SAPHENOUS VEIN AT SEPH-FEM JUNC Right 06/09/2017   Procedure: LIGATION & DIVISION OF LONG SAPHENOUS VEIN AT SAPHENOFEMORAL JUNCTION, OR DISTAL INTERRUPTIONS; Surgeon: Lauretta Grill, MD; Location: MAIN OR [facility]; Service: Vascular  . PR PHLEB VEINS EXTREM TO 20 Right 06/09/2017   Procedure: STAB PHLEBECTOMY OF VARICOSE VEINS, 1 EXTREMITY: 10-20 STAB INCISIONS; Surgeon: Lauretta Grill, MD; Location: MAIN OR [facility]; Service: Vascular    There were no vitals filed for this visit.  Subjective  Assessment - 08/01/17 1434    Subjective  Patient reports walking is feeling better. Still has night pain, not every night now. Asked doctor today about it, said that exercise will help it.     Pertinent History  Patient had surgery on October 25th to remove veins in legs, tied saphenous leg off in upper thigh and removed deeper bulging veins. Pt. Stayed overnight at [facility] due to blood clot history. Went home the next day and on Sunday the 28th I had excruciating pain in R calf. Doppler performed on 29th and found a hematoma and placed in hospital for a week. Left hospital and went to [facility]. Had PT for walking for a week. Has a rollater to use in public but has started decreasing use in home. Right calf is wrapped and painful upon posterior calf  Muscular contraction. .    Limitations  Sitting;Lifting;Standing;Walking;House hold activities    How long can you sit comfortably?  30 minutes    How long can you stand comfortably?  10 minutes    How long can you walk comfortably?  when standing    Diagnostic tests  Doppler    Patient Stated Goals  Decrease pain and swelling and improve quality of walking.     Currently in Pain?  Yes    Pain Score  4     Pain Location  Calf    Pain Orientation  Right    Pain Descriptors / Indicators  Aching;Tightness    Pain Type  Acute pain;Surgical pain    Pain Onset  1 to 4 weeks ago    Pain Frequency  Constant           Airex pad balance beam: tandem walk 6x, side step 8x      Bosu ball balance 2 minutes no UE assistance   Modified weight shift with soccer ball under LLE 2 minutes no UE assistance    High knee marching with toy soldier 8x in // bars  Crane lunge step ups 6" steps 15x each leg   Resisted pf and df GTB 15x each leg     Donning/doffing compression garment   ultrasound 3.3 mHz, 2 cycles 50% had warmer optional, intensity 1.5w/cm.                       PT Education - 08/01/17 1435    Education provided  Yes     Education Details  ambulatory, weightt shift.     Person(s) Educated  Patient    Methods  Explanation;Demonstration;Verbal cues    Comprehension  Verbalized understanding;Returned demonstration       PT Short Term Goals - 07/28/17 1313      PT SHORT TERM GOAL #1   Title  Patient will increase 10 meter walk test to >1.30ms as to improve gait speed for better community ambulation and to reduce fall risk.    Baseline  11/19: .77 m/s  12/13: 1.43 m/s    Time  2    Period  Weeks    Status  Achieved      PT SHORT TERM GOAL #2   Title  Patient will increase ABC scale score >60% to demonstrate better functional mobility and better confidence with ADLs.     Baseline  11/19: 53.1% 12/13: 76%    Time  2    Period  Weeks    Status  Achieved      PT SHORT TERM GOAL #3   Title  Patient will have circumfrential measurements of calves within 1 cm of each other for decreased swelling and pain relief    Baseline  R 32.5 L 28   R 28.5 L 24.5    Time  2    Period  Weeks    Status  Partially Met        PT Long Term Goals - 07/28/17 1311      PT LONG TERM GOAL #1   Title  Patient will increase ABC scale score >80% to demonstrate better functional mobility and better confidence with ADLs.     Baseline  11/19:  53.1% 12/13: 76.25%    Time  4    Period  Weeks    Status  Partially Met      PT LONG TERM GOAL #2   Title  Patient will report a worst pain of 3/10 on VAS in  R calf to improve tolerance with ADLs and reduced symptoms with activities.     Baseline  11/19: 7/10 pain, 12/13: worst pain 5/10     Time  4    Period  Weeks    Status  Partially Met      PT LONG TERM GOAL #3   Title  Patient will increase lower extremity functional scale to >60/80 to demonstrate improved functional mobility and increased tolerance with ADLs.     Baseline  11/19: 40/80 12/13: 60/80  Time  4    Period  Weeks    Status  Achieved      PT LONG TERM GOAL #4   Title  Patient will ambulate with equal  weight acceptance upon L and R LE's to demonstrate improved quality of movement    Baseline  decreased weight acceptance upon RLE>, lateral weight shift on R to lateral aspect of foot due to fear of activaiton of medial calf musculature.     Time  4    Period  Weeks    Status  Partially Met      PT LONG TERM GOAL #5   Title  Patient will ambulate 1644 ft with 6 minute walk test to meet age norm for return to previous level of function.     Baseline  11/21: 1080 ft 12/13: 1600 ft     Time  4    Period  Weeks    Status  Partially Met            Plan - 08/01/17 1644    Clinical Impression Statement  Patient able to weight shift onto RLE with decreased pain and lateral ankle instability as demonstrated with both balance and strengthening interventions today. Fatigue increased ankle and calf fatigue with repetition of single limb stance requirements of dynamic motions. Patient will continue to benefit from skilled physical therapy to decrease pain, swelling, and improve mobility for increased QOL.    Rehab Potential  Fair    Clinical Impairments Affecting Rehab Potential  hematoma, DVTs, GERD, thyroid    PT Frequency  2x / week    PT Duration  4 weeks    PT Treatment/Interventions  ADLs/Self Care Home Management;Electrical Stimulation;Cryotherapy;Biofeedback;Iontophoresis 60m/ml Dexamethasone;Moist Heat;Ultrasound;Parrafin;Therapeutic exercise;Therapeutic activities;Functional mobility training;Stair training;Gait training;DME Instruction;Balance training;Neuromuscular re-education;Patient/family education;Compression bandaging;Manual techniques;Passive range of motion;Dry needling;Energy conservation;Visual/perceptual remediation/compensation    PT Next Visit Plan  ultrasound, balance, recruiting medial calf musculature.     Consulted and Agree with Plan of Care  Patient       Patient will benefit from skilled therapeutic intervention in order to improve the following deficits and  impairments:  Abnormal gait, Decreased activity tolerance, Decreased balance, Decreased endurance, Decreased coordination, Decreased mobility, Decreased range of motion, Increased edema, Difficulty walking, Decreased strength, Impaired perceived functional ability, Impaired flexibility, Improper body mechanics, Postural dysfunction, Pain  Visit Diagnosis: Pain in right lower leg  Other abnormalities of gait and mobility     Problem List Patient Active Problem List   Diagnosis Date Noted  . Chronic sinusitis with recurrent bronchitis 02/03/2016  . Phlebitis and thrombophlebitis of femoral vein ([place] 01/26/2011  . 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 [address] [place] [address]Phone: [phone]  Fax:  [phone] Name: Marissa ZANELLAMRN: [id number] of Birth: [birth year]

## 2017-08-03 ENCOUNTER — Ambulatory Visit: Payer: PPO | Admitting: Internal Medicine

## 2017-08-03 ENCOUNTER — Encounter: Payer: Self-pay | Admitting: Internal Medicine

## 2017-08-03 ENCOUNTER — Ambulatory Visit: Payer: PPO

## 2017-08-03 VITALS — BP 130/84 | HR 67 | Resp 16 | Ht 63.0 in | Wt 154.4 lb

## 2017-08-03 DIAGNOSIS — R300 Dysuria: Secondary | ICD-10-CM | POA: Diagnosis not present

## 2017-08-03 DIAGNOSIS — Z1239 Encounter for other screening for malignant neoplasm of breast: Secondary | ICD-10-CM

## 2017-08-03 DIAGNOSIS — M79661 Pain in right lower leg: Secondary | ICD-10-CM | POA: Diagnosis not present

## 2017-08-03 DIAGNOSIS — I8011 Phlebitis and thrombophlebitis of right femoral vein: Secondary | ICD-10-CM | POA: Diagnosis not present

## 2017-08-03 DIAGNOSIS — J3089 Other allergic rhinitis: Secondary | ICD-10-CM | POA: Diagnosis not present

## 2017-08-03 DIAGNOSIS — Z0001 Encounter for general adult medical examination with abnormal findings: Secondary | ICD-10-CM | POA: Diagnosis not present

## 2017-08-03 DIAGNOSIS — E782 Mixed hyperlipidemia: Secondary | ICD-10-CM | POA: Diagnosis not present

## 2017-08-03 DIAGNOSIS — Z1231 Encounter for screening mammogram for malignant neoplasm of breast: Secondary | ICD-10-CM

## 2017-08-03 DIAGNOSIS — R2689 Other abnormalities of gait and mobility: Secondary | ICD-10-CM

## 2017-08-03 DIAGNOSIS — E039 Hypothyroidism, unspecified: Secondary | ICD-10-CM | POA: Diagnosis not present

## 2017-08-03 DIAGNOSIS — T148XXA Other injury of unspecified body region, initial encounter: Secondary | ICD-10-CM

## 2017-08-03 MED ORDER — MONTELUKAST SODIUM 10 MG PO TABS: 10.0000 mg | ORAL_TABLET | Freq: Every day | ORAL | 3 refills | Status: DC

## 2017-08-03 NOTE — Therapy (Signed)
[place] MAIN [facility] SERVICES [address] [place], [place], [address] Phone: [phone]   Fax:  [phone]  Physical Therapy Treatment  Patient Details  Name: Marissa Wilson MRN: [id number] Date of Birth: 05/05/1946 Referring Provider: Thomasene Mohair   Encounter Date: 08/03/2017  PT End of Session - 08/03/17 1352    Visit Number  9    Number of Visits  16    Date for PT Re-Evaluation  08/25/17    Authorization - Visit Number  2    Authorization - Number of Visits  10    PT Start Time  2836    PT Stop Time  1430    PT Time Calculation (min)  45 min    Equipment Utilized During Treatment  Gait belt    Activity Tolerance  Patient tolerated treatment well;Treatment limited secondary to medical complications (Comment)    Behavior During Therapy  [facility] for tasks assessed/performed       Past Medical History:  Diagnosis Date  . Asthma   . Gastrointestinal disorder   . GERD (gastroesophageal reflux disease)   . Hyperlipidemia, unspecified   . Hypertension   . Hypothyroid   . Osteoarthritis   . Sleep apnea   . Thyroid disease   . UTI (urinary tract infection)     Past Surgical History:  Procedure Laterality Date  . ABDOMINAL HYSTERECTOMY    . PR ENDOVENOUS LASTER, 1ST VEIN Right 06/09/2017   Procedure: EVLA of Right GSV; Surgeon: Lauretta Grill, MD; Location: MAIN OR [facility]; Service: Vascular  . PR LIGATN LONG SAPHENOUS VEIN AT SEPH-FEM JUNC Right 06/09/2017   Procedure: LIGATION & DIVISION OF LONG SAPHENOUS VEIN AT SAPHENOFEMORAL JUNCTION, OR DISTAL INTERRUPTIONS; Surgeon: Lauretta Grill, MD; Location: MAIN OR [facility]; Service: Vascular  . PR PHLEB VEINS EXTREM TO 20 Right 06/09/2017   Procedure: STAB PHLEBECTOMY OF VARICOSE VEINS, 1 EXTREMITY: 10-20 STAB INCISIONS; Surgeon: Lauretta Grill, MD; Location: MAIN OR [facility]; Service: Vascular    There were no vitals filed for this visit.        .     Subjective Assessment - 08/03/17 1350    Subjective  Patient went to doctor who noticed her cholesterole levels were abnormal. Patient has been compliant with HEP.     Pertinent History  Patient had surgery on October 25th to remove veins in legs, tied saphenous leg off in upper thigh and removed deeper bulging veins. Pt. Stayed overnight at [facility] due to blood clot history. Went home the next day and on Sunday the 28th I had excruciating pain in R calf. Doppler performed on 29th and found a hematoma and placed in hospital for a week. Left hospital and went to [facility]. Had PT for walking for a week. Has a rollater to use in public but has started decreasing use in home. Right calf is wrapped and painful upon posterior calf  Muscular contraction. .    Limitations  Sitting;Lifting;Standing;Walking;House hold activities    How long can you sit comfortably?  30 minutes    How long can you stand comfortably?  10 minutes    How long can you walk comfortably?  when standing    Diagnostic tests  Doppler    Patient Stated Goals  Decrease pain and swelling and improve quality of walking.     Currently in Pain?  Yes    Pain Score  3     Pain Location  Calf    Pain  Orientation  Right    Pain Descriptors / Indicators  Aching;Tightness    Pain Type  Acute pain;Surgical pain    Pain Onset  1 to 4 weeks ago    Pain Frequency  Constant       Nustep Lvl 2 seat 7 4 minutes, cues for keeping RPM above 60  Stair negotiation : cues for activating abdominal  Musculature and increasing weight shift through LE's for improved reciprocal motion. Performed 4x with no UE support and with reciprocal motion.   Ambulating putting weight through medial aspect of foot rather than lateral aspect 200 ft. Ambulate on lateral aspect of foot due to pain with contraction of medial calf musculature  Bosu ball balance 2 minutes no UE assistance    Bosu ball mini squats 10x   Seated LAQ with ball squeeze adduction between  feet 10x      Donning/doffing compression garment   ultrasound 3.3 mHz, 2 cycles 50% had warmer optional, intensity 1.5w/cm.  Patient performed stair negotiation with improved mechanics upon repetition and verbal cueing for sequencing of task. Patient ambulates on lateral aspect of foot due to pain of activation of medial musculature and weakness of medial calf musculature. Patient continues to progress with functional mobility. Patient will continue to benefit from skilled physical therapy to decrease pain, swelling, and improve mobility for increased QOL.                        PT Education - 08/03/17 1352    Education provided  Yes    Education Details  ambulatory, weight shift     Person(s) Educated  Patient    Methods  Explanation;Demonstration;Verbal cues    Comprehension  Verbalized understanding;Returned demonstration       PT Short Term Goals - 07/28/17 1313      PT SHORT TERM GOAL #1   Title  Patient will increase 10 meter walk test to >1.55ms as to improve gait speed for better community ambulation and to reduce fall risk.    Baseline  11/19: .77 m/s  12/13: 1.43 m/s    Time  2    Period  Weeks    Status  Achieved      PT SHORT TERM GOAL #2   Title  Patient will increase ABC scale score >60% to demonstrate better functional mobility and better confidence with ADLs.     Baseline  11/19: 53.1% 12/13: 76%    Time  2    Period  Weeks    Status  Achieved      PT SHORT TERM GOAL #3   Title  Patient will have circumfrential measurements of calves within 1 cm of each other for decreased swelling and pain relief    Baseline  R 32.5 L 28   R 28.5 L 24.5    Time  2    Period  Weeks    Status  Partially Met        PT Long Term Goals - 07/28/17 1311      PT LONG TERM GOAL #1   Title  Patient will increase ABC scale score >80% to demonstrate better functional mobility and better confidence with ADLs.     Baseline  11/19:  53.1% 12/13: 76.25%    Time   4    Period  Weeks    Status  Partially Met      PT LONG TERM GOAL #2   Title  Patient will report a  worst pain of 3/10 on VAS in  R calf to improve tolerance with ADLs and reduced symptoms with activities.     Baseline  11/19: 7/10 pain, 12/13: worst pain 5/10     Time  4    Period  Weeks    Status  Partially Met      PT LONG TERM GOAL #3   Title  Patient will increase lower extremity functional scale to >60/80 to demonstrate improved functional mobility and increased tolerance with ADLs.     Baseline  11/19: 40/80 12/13: 60/80    Time  4    Period  Weeks    Status  Achieved      PT LONG TERM GOAL #4   Title  Patient will ambulate with equal weight acceptance upon L and R LE's to demonstrate improved quality of movement    Baseline  decreased weight acceptance upon RLE>, lateral weight shift on R to lateral aspect of foot due to fear of activaiton of medial calf musculature.     Time  4    Period  Weeks    Status  Partially Met      PT LONG TERM GOAL #5   Title  Patient will ambulate 1644 ft with 6 minute walk test to meet age norm for return to previous level of function.     Baseline  11/21: 1080 ft 12/13: 1600 ft     Time  4    Period  Weeks    Status  Partially Met            Plan - 08/03/17 1609    Clinical Impression Statement  Patient performed stair negotiation with improved mechanics upon repetition and verbal cueing for sequencing of task. Patient ambulates on lateral aspect of foot due to pain of activation of medial musculature and weakness of medial calf musculature. Patient continues to progress with functional mobility. Patient will continue to benefit from skilled physical therapy to decrease pain, swelling, and improve mobility for increased QOL.     Rehab Potential  Fair    Clinical Impairments Affecting Rehab Potential  hematoma, DVTs, GERD, thyroid    PT Frequency  2x / week    PT Duration  4 weeks    PT Treatment/Interventions  ADLs/Self Care Home  Management;Electrical Stimulation;Cryotherapy;Biofeedback;Iontophoresis 26m/ml Dexamethasone;Moist Heat;Ultrasound;Parrafin;Therapeutic exercise;Therapeutic activities;Functional mobility training;Stair training;Gait training;DME Instruction;Balance training;Neuromuscular re-education;Patient/family education;Compression bandaging;Manual techniques;Passive range of motion;Dry needling;Energy conservation;Visual/perceptual remediation/compensation    PT Next Visit Plan  ultrasound, balance, recruiting medial calf musculature.     Consulted and Agree with Plan of Care  Patient       Patient will benefit from skilled therapeutic intervention in order to improve the following deficits and impairments:  Abnormal gait, Decreased activity tolerance, Decreased balance, Decreased endurance, Decreased coordination, Decreased mobility, Decreased range of motion, Increased edema, Difficulty walking, Decreased strength, Impaired perceived functional ability, Impaired flexibility, Improper body mechanics, Postural dysfunction, Pain  Visit Diagnosis: Pain in right lower leg  Other abnormalities of gait and mobility     Problem List Patient Active Problem List   Diagnosis Date Noted  . Encounter for routine adult physical exam with abnormal findings 08/03/2017  . Mixed hyperlipidemia 08/03/2017  . Acute deep vein thrombosis (DVT) of proximal vein of lower extremity ([place] 06/21/2017  . Hematoma 06/21/2017  . Chronic sinusitis with recurrent bronchitis 02/03/2016  . Phlebitis and thrombophlebitis of femoral vein ([place] 01/26/2011  . 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

## 2017-08-03 NOTE — Progress Notes (Signed)
[facility] [place], [place] [address]  Internal MEDICINE  Office Visit Note  Patient Name: Marissa Wilson  [id number]  [id number]  Date of Service: 08/03/2017  Complaints/HPI Pt is here for routine health maintenance examination:  Pt is doing well after her surgery for varicose  Takes all meds as prescribed   Current Medication: Outpatient Encounter Medications as of 08/03/2017  Medication Sig  . acetaminophen (TYLENOL) 650 MG CR tablet Take 650 mg every 6 (six) hours by mouth.  . Cholecalciferol 1000 units capsule Take 1,000 Units daily by mouth.  . levothyroxine (SYNTHROID, LEVOTHROID) 75 MCG tablet Take 75 mcg daily before breakfast by mouth.  . montelukast (SINGULAIR) 10 MG tablet Take 10 mg at bedtime by mouth.  Marland Kitchen omeprazole (PRILOSEC) 40 MG capsule Take 40 mg daily by mouth.  . triamterene-hydrochlorothiazide (DYAZIDE) 37.5-25 MG capsule Take 1 capsule daily by mouth.  Alveda Reasons 10 MG TABS tablet Take 10 mg daily by mouth.   . Calcium Carb-Cholecalciferol 500-200 MG-UNIT TABS Take 1 tablet daily by mouth.  . docusate sodium (COLACE) 100 MG capsule Take 100 mg 2 (two) times daily by mouth.  . lidocaine (LIDODERM) 5 % Place 1 patch daily onto the skin. Apply to right calf , Remove & Discard patch within 12 hours or as directed by MD  . methocarbamol (ROBAXIN) 500 MG tablet Take 500 mg every 6 (six) hours as needed by mouth for muscle spasms.  . Oxycodone HCl 10 MG TABS Take 1 tablet (10 mg total) every 4 (four) hours as needed by mouth. (Patient not taking: Reported on 08/03/2017)  . polyethylene glycol (MIRALAX / GLYCOLAX) packet Take 17 g 2 (two) times daily by mouth.   No facility-administered encounter medications on file as of 08/03/2017.     Surgical History: Past Surgical History:  Procedure Laterality Date  . ABDOMINAL HYSTERECTOMY    . PR ENDOVENOUS LASTER, 1ST VEIN Right 06/09/2017   Procedure: EVLA of Right GSV; Surgeon: Lauretta Grill, MD; Location: MAIN OR [facility]; Service: Vascular  . PR LIGATN LONG SAPHENOUS VEIN AT SEPH-FEM JUNC Right 06/09/2017   Procedure: LIGATION & DIVISION OF LONG SAPHENOUS VEIN AT SAPHENOFEMORAL JUNCTION, OR DISTAL INTERRUPTIONS; Surgeon: Lauretta Grill, MD; Location: MAIN OR [facility]; Service: Vascular  . PR PHLEB VEINS EXTREM TO 20 Right 06/09/2017   Procedure: STAB PHLEBECTOMY OF VARICOSE VEINS, 1 EXTREMITY: 10-20 STAB INCISIONS; Surgeon: Lauretta Grill, MD; Location: MAIN OR [facility]; Service: Vascular    Medical History: Past Medical History:  Diagnosis Date  . Asthma   . Gastrointestinal disorder   . GERD (gastroesophageal reflux disease)   . Hyperlipidemia, unspecified   . Hypertension   . Hypothyroid   . Osteoarthritis   . Sleep apnea   . Thyroid disease   . UTI (urinary tract infection)     Family History: Family History  Problem Relation Age of Onset  . Breast cancer Mother 54  . Varicose Veins Mother   . Lung cancer Father   . Bladder Cancer Neg Hx   . Kidney cancer Neg Hx       ROS  General: (-) fever, (-) chills, (-) night sweats, (-) weakness, (-) changes in appetite. Skin: (-) rashes, (-) itching,. Eyes: (-) visual changes, (-) redness, (-) itching, (-) double or blurred vision. Nose and Sinuses: (-) nasal stuffiness or itchiness, (-) postnasal drip, (-) nosebleeds, (-) sinus trouble. Mouth and Throat: (-) sore throat, (-) hoarseness. Neck: (-) swollen glands, (-)  enlarged thyroid, (-) neck pain. Respiratory: (-) cough, (-) bloody sputum, (-) shortness of breath, (-) wheezing. Cardiovascular: (-) ankle swelling, (-) chest pain. Lymphatic: (-) lymph node enlargement, (-) lymph node tenderness. Neurologic: (-) numbness, (-) tingling,(-) dizziness. Psychiatric: (-) anxiety, (-) depression.  Vital Signs: Blood pressure 130/84, pulse 67, resp. rate 16, height 5\' 3"  (1.6 m), weight 154 lb 6.4 oz (70 kg), SpO2 95 %.  Examination: General  Appearance: The patient is well-developed, well-nourished, and in no distress. Skin: Gross inspection of skin demonstrates no evidence of abnormality. Head: Patient's head is normocephalic, no gross deformities. Eyes: no gross deformities noted. ENT: ears appear grossly normal. Nasopharynx appears to be normal. Neck: Supple. No thyromegaly. No LAD. Respiratory: Lungs are clear to auscultation with no adventitious sounds. Cardiovascular: Normal S1 and S2 without murmur or rub. Extremities: No cyanosis. pulses are equal. Neurologic: Alert and oriented. No involuntary movements.   Assessment and Plan: Patient Active Problem List   Diagnosis Date Noted  . Acute deep vein thrombosis (DVT) of proximal vein of lower extremity ([place]) 06/21/2017  . Hematoma 06/21/2017  . Chronic sinusitis with recurrent bronchitis 02/03/2016  . Phlebitis and thrombophlebitis of femoral vein ([place]) 01/26/2011  . 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 01/26/2011    Recent Results (from the past 2160 hour(s))  Urinalysis, Routine w reflex microscopic     Status: None   Collection Time: 08/03/17  4:38 PM  Result Value Ref Range   Specific Gravity, UA 1.007 1.005 - 1.030   pH, UA 7.0 5.0 - 7.5   Color, UA Yellow Yellow   Appearance Ur Clear Clear   Leukocytes, UA Negative Negative   Protein, UA Negative Negative/Trace   Glucose, UA Negative Negative   Ketones, UA Negative Negative   RBC, UA Negative Negative   Bilirubin, UA Negative Negative   Urobilinogen, Ur 0.2 0.2 - 1.0 mg/dL   Nitrite, UA Negative Negative   Microscopic Examination Comment     Comment: Microscopic not indicated and not performed.  CBC with Differential/Platelet     Status: Abnormal   Collection Time: 08/05/17 10:32 AM  Result Value Ref Range   WBC 5.4 3.4 - 10.8 x10E3/uL   RBC 4.36 3.77 - 5.28 x10E6/uL   Hemoglobin 14.4 11.1 - 15.9 g/dL   Hematocrit 39.4 34.0 - 46.6 %   MCV 90 79 - 97 fL   MCH 33.0 26.6 - 33.0 pg    MCHC 36.5 (H) 31.5 - 35.7 g/dL   RDW 13.1 12.3 - 15.4 %   Platelets 231 150 - 379 x10E3/uL   Neutrophils 56 Not Estab. %   Lymphs 30 Not Estab. %   Monocytes 11 Not Estab. %   Eos 2 Not Estab. %   Basos 1 Not Estab. %   Neutrophils Absolute 3.0 1.4 - 7.0 x10E3/uL   Lymphocytes Absolute 1.6 0.7 - 3.1 x10E3/uL   Monocytes Absolute 0.6 0.1 - 0.9 x10E3/uL   EOS (ABSOLUTE) 0.1 0.0 - 0.4 x10E3/uL   Basophils Absolute 0.1 0.0 - 0.2 x10E3/uL   Immature Granulocytes 0 Not Estab. %   Immature Grans (Abs) 0.0 0.0 - 0.1 x10E3/uL   Hematology Comments: Note:     Comment: Verified by microscopic examination.  Lipid Panel With LDL/HDL Ratio     Status: Abnormal   Collection Time: 08/05/17 10:32 AM  Result Value Ref Range   Cholesterol, Total 209 (H) 100 - 199 mg/dL   Triglycerides 131 0 - 149 mg/dL   HDL  56 >39 mg/dL   VLDL Cholesterol Cal 26 5 - 40 mg/dL   LDL Calculated 127 (H) 0 - 99 mg/dL   LDl/HDL Ratio 2.3 0.0 - 3.2 ratio    Comment:                                     LDL/HDL Ratio                                             Men  Women                               1/2 Avg.Risk  1.0    1.5                                   Avg.Risk  3.6    3.2                                2X Avg.Risk  6.2    5.0                                3X Avg.Risk  8.0    6.1   TSH     Status: None   Collection Time: 08/05/17 10:32 AM  Result Value Ref Range   TSH 1.250 0.450 - 4.500 uIU/mL  T4, free     Status: None   Collection Time: 08/05/17 10:32 AM  Result Value Ref Range   Free T4 1.51 0.82 - 1.77 ng/dL  Comprehensive metabolic panel     Status: None   Collection Time: 08/05/17 10:32 AM  Result Value Ref Range   Glucose 88 65 - 99 mg/dL   BUN 13 8 - 27 mg/dL   Creatinine, Ser 0.65 0.57 - 1.00 mg/dL   GFR calc non Af Amer 89 >59 mL/min/1.73   GFR calc Af Amer 103 >59 mL/min/1.73   BUN/Creatinine Ratio 20 12 - 28   Sodium 139 134 - 144 mmol/L   Potassium 4.3 3.5 - 5.2 mmol/L   Chloride 101 96  - 106 mmol/L   CO2 24 20 - 29 mmol/L   Calcium 9.6 8.7 - 10.3 mg/dL   Total Protein 6.5 6.0 - 8.5 g/dL   Albumin 4.3 3.5 - 4.8 g/dL   Globulin, Total 2.2 1.5 - 4.5 g/dL   Albumin/Globulin Ratio 2.0 1.2 - 2.2   Bilirubin Total 0.6 0.0 - 1.2 mg/dL   Alkaline Phosphatase 68 39 - 117 IU/L   AST 22 0 - 40 IU/L   ALT 19 0 - 32 IU/L  1. Encounter for routine adult physical exam with abnormal findings - Pt is up to date all preventive diagnostics  - Comprehensive metabolic panel - Urinalysis  2. Phlebitis or thrombophlebitis of right femoral vein (HCC) - Per Vascular at [facility]  - CBC with Differential/Platelet  3. Hypothyroidism, unspecified type - Continue synthroid as before  - TSH - T4, free  4. Mixed hyperlipidemia - Diet controlled  - Lipid Panel With LDL/HDL Ratio  5. Hematoma -  Resolved   6. Screening breast examination - MM Digital Screening; Future  7. Dysuria - Urinalysis, Routine w reflex microscopic - H/ o UTI  8. Non-seasonal allergic rhinitis, unspecified trigger - montelukast (SINGULAIR) 10 MG tablet; Take 1 tablet (10 mg total) by mouth at bedtime.  Dispense: 90 tablet; Refill: 3  General Counseling: I have discussed the findings of the evaluation and examination with Judeen Hammans.  I have also discussed any further diagnostic evaluation thatmay be needed or ordered today. Amariss verbalizes understanding of the findings of todays visit. We also reviewed her medications today and discussed drug interactions and side effects including but not limited excessive drowsiness and altered mental states. We also discussed that there is always a risk not just to her but also people around her. she has been encouraged to call the office with any questions or concerns that should arise related to todays visit.   Time spent 35  I have personally obtained a history, examined the patient, evaluated laboratory and imaging results, formulated the assessment and plan and placed  orders.   Lavera Guise, MD  Internal Medicine

## 2017-08-04 LAB — URINALYSIS, ROUTINE W REFLEX MICROSCOPIC
Bilirubin, UA: NEGATIVE
Glucose, UA: NEGATIVE
Ketones, UA: NEGATIVE
LEUKOCYTES UA: NEGATIVE
Nitrite, UA: NEGATIVE
PH UA: 7 (ref 5.0–7.5)
PROTEIN UA: NEGATIVE
RBC, UA: NEGATIVE
Specific Gravity, UA: 1.007 (ref 1.005–1.030)
Urobilinogen, Ur: 0.2 mg/dL (ref 0.2–1.0)

## 2017-08-05 DIAGNOSIS — I8011 Phlebitis and thrombophlebitis of right femoral vein: Secondary | ICD-10-CM | POA: Diagnosis not present

## 2017-08-05 DIAGNOSIS — Z0001 Encounter for general adult medical examination with abnormal findings: Secondary | ICD-10-CM | POA: Diagnosis not present

## 2017-08-05 DIAGNOSIS — E039 Hypothyroidism, unspecified: Secondary | ICD-10-CM | POA: Diagnosis not present

## 2017-08-05 DIAGNOSIS — E782 Mixed hyperlipidemia: Secondary | ICD-10-CM | POA: Diagnosis not present

## 2017-08-06 LAB — COMPREHENSIVE METABOLIC PANEL
A/G RATIO: 2 (ref 1.2–2.2)
ALT: 19 IU/L (ref 0–32)
AST: 22 IU/L (ref 0–40)
Albumin: 4.3 g/dL (ref 3.5–4.8)
Alkaline Phosphatase: 68 IU/L (ref 39–117)
BUN/Creatinine Ratio: 20 (ref 12–28)
BUN: 13 mg/dL (ref 8–27)
Bilirubin Total: 0.6 mg/dL (ref 0.0–1.2)
CALCIUM: 9.6 mg/dL (ref 8.7–10.3)
CO2: 24 mmol/L (ref 20–29)
Chloride: 101 mmol/L (ref 96–106)
Creatinine, Ser: 0.65 mg/dL (ref 0.57–1.00)
GFR, EST AFRICAN AMERICAN: 103 mL/min/{1.73_m2} (ref >59)
GFR, EST NON AFRICAN AMERICAN: 89 mL/min/{1.73_m2} (ref >59)
GLOBULIN, TOTAL: 2.2 g/dL (ref 1.5–4.5)
Glucose: 88 mg/dL (ref 65–99)
POTASSIUM: 4.3 mmol/L (ref 3.5–5.2)
SODIUM: 139 mmol/L (ref 134–144)
Total Protein: 6.5 g/dL (ref 6.0–8.5)

## 2017-08-06 LAB — CBC WITH DIFFERENTIAL/PLATELET
BASOS ABS: 0.1 10*3/uL (ref 0.0–0.2)
Basos: 1 %
EOS (ABSOLUTE): 0.1 10*3/uL (ref 0.0–0.4)
Eos: 2 %
Hematocrit: 39.4 % (ref 34.0–46.6)
Hemoglobin: 14.4 g/dL (ref 11.1–15.9)
Immature Grans (Abs): 0 10*3/uL (ref 0.0–0.1)
Immature Granulocytes: 0 %
LYMPHS ABS: 1.6 10*3/uL (ref 0.7–3.1)
Lymphs: 30 %
MCH: 33 pg (ref 26.6–33.0)
MCHC: 36.5 g/dL — AB (ref 31.5–35.7)
MCV: 90 fL (ref 79–97)
MONOCYTES: 11 %
MONOS ABS: 0.6 10*3/uL (ref 0.1–0.9)
NEUTROS ABS: 3 10*3/uL (ref 1.4–7.0)
Neutrophils: 56 %
PLATELETS: 231 10*3/uL (ref 150–379)
RBC: 4.36 x10E6/uL (ref 3.77–5.28)
RDW: 13.1 % (ref 12.3–15.4)
WBC: 5.4 10*3/uL (ref 3.4–10.8)

## 2017-08-06 LAB — TSH: TSH: 1.25 u[IU]/mL (ref 0.450–4.500)

## 2017-08-06 LAB — LIPID PANEL WITH LDL/HDL RATIO
Cholesterol, Total: 209 mg/dL — ABNORMAL HIGH (ref 100–199)
HDL: 56 mg/dL (ref >39)
LDL Calculated: 127 mg/dL — ABNORMAL HIGH (ref 0–99)
LDl/HDL Ratio: 2.3 ratio (ref 0.0–3.2)
Triglycerides: 131 mg/dL (ref 0–149)
VLDL CHOLESTEROL CAL: 26 mg/dL (ref 5–40)

## 2017-08-06 LAB — T4, FREE: Free T4: 1.51 ng/dL (ref 0.82–1.77)

## 2017-08-11 ENCOUNTER — Ambulatory Visit: Payer: PPO

## 2017-08-11 DIAGNOSIS — R2689 Other abnormalities of gait and mobility: Secondary | ICD-10-CM

## 2017-08-11 DIAGNOSIS — M79661 Pain in right lower leg: Secondary | ICD-10-CM

## 2017-08-11 NOTE — Therapy (Signed)
[place] MAIN [facility] SERVICES [address] [place], [place], [address] Phone: [phone]   Fax:  [phone]  Physical Therapy Treatment  Patient Details  Name: Marissa Wilson MRN: [id number] Date of Birth: [birth year] Referring Provider: Thomasene Mohair   Encounter Date: 08/11/2017  PT End of Session - 08/11/17 1605    Visit Number  10    Number of Visits  16    Date for PT Re-Evaluation  08/25/17    Authorization - Visit Number  3    Authorization - Number of Visits  10    PT Start Time  1430    PT Stop Time  1515    PT Time Calculation (min)  45 min    Equipment Utilized During Treatment  Gait belt    Activity Tolerance  Patient tolerated treatment well;Treatment limited secondary to medical complications (Comment)    Behavior During Therapy  [facility] for tasks assessed/performed       Past Medical History:  Diagnosis Date  . Asthma   . Gastrointestinal disorder   . GERD (gastroesophageal reflux disease)   . Hyperlipidemia, unspecified   . Hypertension   . Hypothyroid   . Osteoarthritis   . Sleep apnea   . Thyroid disease   . UTI (urinary tract infection)     Past Surgical History:  Procedure Laterality Date  . ABDOMINAL HYSTERECTOMY    . PR ENDOVENOUS LASTER, 1ST VEIN Right 06/09/2017   Procedure: EVLA of Right GSV; Surgeon: Lauretta Grill, MD; Location: MAIN OR [facility]; Service: Vascular  . PR LIGATN LONG SAPHENOUS VEIN AT SEPH-FEM JUNC Right 06/09/2017   Procedure: LIGATION & DIVISION OF LONG SAPHENOUS VEIN AT SAPHENOFEMORAL JUNCTION, OR DISTAL INTERRUPTIONS; Surgeon: Lauretta Grill, MD; Location: MAIN OR [facility]; Service: Vascular  . PR PHLEB VEINS EXTREM TO 20 Right 06/09/2017   Procedure: STAB PHLEBECTOMY OF VARICOSE VEINS, 1 EXTREMITY: 10-20 STAB INCISIONS; Surgeon: Lauretta Grill, MD; Location: MAIN OR [facility]; Service: Vascular    There were no vitals filed for this visit.  Subjective  Assessment - 08/11/17 1600    Subjective  Patient reports standing more than normal due to the holidays and having her children and grandchildren over for the holidays. Walking and focusing on how she is stepping.     Pertinent History  Patient had surgery on October 25th to remove veins in legs, tied saphenous leg off in upper thigh and removed deeper bulging veins. Pt. Stayed overnight at [facility] due to blood clot history. Went home the next day and on Sunday the 28th I had excruciating pain in R calf. Doppler performed on 29th and found a hematoma and placed in hospital for a week. Left hospital and went to [facility]. Had PT for walking for a week. Has a rollater to use in public but has started decreasing use in home. Right calf is wrapped and painful upon posterior calf  Muscular contraction. .    Limitations  Sitting;Lifting;Standing;Walking;House hold activities    How long can you sit comfortably?  30 minutes    How long can you stand comfortably?  10 minutes    How long can you walk comfortably?  when standing    Diagnostic tests  Doppler    Patient Stated Goals  Decrease pain and swelling and improve quality of walking.     Currently in Pain?  Yes    Pain Score  3     Pain Location  Calf  Pain Orientation  Right    Pain Descriptors / Indicators  Aching;Tightness    Pain Type  Acute pain;Surgical pain    Pain Onset  1 to 4 weeks ago    Pain Frequency  Constant           K tape to lateral aspect of R foot : around lateral malleoli with split tape. Secondary tape bringing foot into further pronation.   Roll out IT band with tennis ball, teaching pt. And pt's husband how to perform up and down R IT band in side lying position  IT band stretch side lying 2x 60 seconds   Ambulating 59M x 8 trials with cues for step mechanics, focusing on not ambulating on lateral aspect of foot, rather promoting neutral foot rocker mechanics, improved with tape.   Bosu ball: black side up:  balance Bosu ball squat: 10x , cues for putting weight through heels  Education on tape and IT band.      Donning/doffing compression garment   ultrasound 3.3 mHz, 2 cycles 50% had warmer optional, intensity 1.5w/cm.    Pt. response to medical necessity:  Patient will continue to benefit from skilled physical therapy to decrease pain, swelling, and improve mobility for increased QOL.              PT Education - 08/11/17 1604    Education provided  Yes    Education Details  ambulation, K tape, IT band    Person(s) Educated  Patient    Methods  Explanation;Demonstration;Verbal cues    Comprehension  Verbalized understanding;Returned demonstration       PT Short Term Goals - 07/28/17 1313      PT SHORT TERM GOAL #1   Title  Patient will increase 10 meter walk test to >1.42ms as to improve gait speed for better community ambulation and to reduce fall risk.    Baseline  11/19: .77 m/s  12/13: 1.43 m/s    Time  2    Period  Weeks    Status  Achieved      PT SHORT TERM GOAL #2   Title  Patient will increase ABC scale score >60% to demonstrate better functional mobility and better confidence with ADLs.     Baseline  11/19: 53.1% 12/13: 76%    Time  2    Period  Weeks    Status  Achieved      PT SHORT TERM GOAL #3   Title  Patient will have circumfrential measurements of calves within 1 cm of each other for decreased swelling and pain relief    Baseline  R 32.5 L 28   R 28.5 L 24.5    Time  2    Period  Weeks    Status  Partially Met        PT Long Term Goals - 07/28/17 1311      PT LONG TERM GOAL #1   Title  Patient will increase ABC scale score >80% to demonstrate better functional mobility and better confidence with ADLs.     Baseline  11/19:  53.1% 12/13: 76.25%    Time  4    Period  Weeks    Status  Partially Met      PT LONG TERM GOAL #2   Title  Patient will report a worst pain of 3/10 on VAS in  R calf to improve tolerance with ADLs and reduced  symptoms with activities.     Baseline  11/19: 7/10  pain, 12/13: worst pain 5/10     Time  4    Period  Weeks    Status  Partially Met      PT LONG TERM GOAL #3   Title  Patient will increase lower extremity functional scale to >60/80 to demonstrate improved functional mobility and increased tolerance with ADLs.     Baseline  11/19: 40/80 12/13: 60/80    Time  4    Period  Weeks    Status  Achieved      PT LONG TERM GOAL #4   Title  Patient will ambulate with equal weight acceptance upon L and R LE's to demonstrate improved quality of movement    Baseline  decreased weight acceptance upon RLE>, lateral weight shift on R to lateral aspect of foot due to fear of activaiton of medial calf musculature.     Time  4    Period  Weeks    Status  Partially Met      PT LONG TERM GOAL #5   Title  Patient will ambulate 1644 ft with 6 minute walk test to meet age norm for return to previous level of function.     Baseline  11/21: 1080 ft 12/13: 1600 ft     Time  4    Period  Weeks    Status  Partially Met            Plan - 08/11/17 1608    Clinical Impression Statement  Patient gait mechanics improved after application of K tape allowing for improved sequencing of ankle and foot rockers with equal loading of foot. Patient's IT band tightness caused mild gait deviations which improved after manual tx. Patient will continue to benefit from skilled physical therapy to decrease pain, swelling, and improve mobility for increased QOL.     Rehab Potential  Fair    Clinical Impairments Affecting Rehab Potential  hematoma, DVTs, GERD, thyroid    PT Frequency  2x / week    PT Duration  4 weeks    PT Treatment/Interventions  ADLs/Self Care Home Management;Electrical Stimulation;Cryotherapy;Biofeedback;Iontophoresis 12m/ml Dexamethasone;Moist Heat;Ultrasound;Parrafin;Therapeutic exercise;Therapeutic activities;Functional mobility training;Stair training;Gait training;DME Instruction;Balance  training;Neuromuscular re-education;Patient/family education;Compression bandaging;Manual techniques;Passive range of motion;Dry needling;Energy conservation;Visual/perceptual remediation/compensation    PT Next Visit Plan  ultrasound, balance, recruiting medial calf musculature.     Consulted and Agree with Plan of Care  Patient       Patient will benefit from skilled therapeutic intervention in order to improve the following deficits and impairments:  Abnormal gait, Decreased activity tolerance, Decreased balance, Decreased endurance, Decreased coordination, Decreased mobility, Decreased range of motion, Increased edema, Difficulty walking, Decreased strength, Impaired perceived functional ability, Impaired flexibility, Improper body mechanics, Postural dysfunction, Pain  Visit Diagnosis: Pain in right lower leg  Other abnormalities of gait and mobility     Problem List Patient Active Problem List   Diagnosis Date Noted  . Encounter for routine adult physical exam with abnormal findings 08/03/2017  . Mixed hyperlipidemia 08/03/2017  . Acute [place] (DVT) [place] 06/21/2017  . Hematoma 06/21/2017  . Chronic sinusitis with recurrent bronchitis 02/03/2016  . Phlebitis and thrombophlebitis of femoral vein ([place] 01/26/2011  . 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

## 2017-08-12 ENCOUNTER — Ambulatory Visit: Payer: PPO

## 2017-08-12 DIAGNOSIS — R2689 Other abnormalities of gait and mobility: Secondary | ICD-10-CM

## 2017-08-12 DIAGNOSIS — M79661 Pain in right lower leg: Secondary | ICD-10-CM

## 2017-08-12 NOTE — Therapy (Signed)
[place] MAIN [facility] SERVICES [address] [place], [place], [address] Phone: [phone]   Fax:  [phone]  Physical Therapy Treatment  Patient Details  Name: Marissa Wilson MRN: [id number] Date of Birth: May 08, 1946 Referring Provider: Thomasene Mohair   Encounter Date: 08/12/2017  PT End of Session - 08/12/17 2011    Visit Number  11    Number of Visits  16    Date for PT Re-Evaluation  08/25/17    Authorization - Visit Number  4    Authorization - Number of Visits  10    PT Start Time  6962    PT Stop Time  1332    PT Time Calculation (min)  44 min    Equipment Utilized During Treatment  Gait belt    Activity Tolerance  Patient tolerated treatment well;Treatment limited secondary to medical complications (Comment)    Behavior During Therapy  [facility] for tasks assessed/performed       Past Medical History:  Diagnosis Date  . Asthma   . Gastrointestinal disorder   . GERD (gastroesophageal reflux disease)   . Hyperlipidemia, unspecified   . Hypertension   . Hypothyroid   . Osteoarthritis   . Sleep apnea   . Thyroid disease   . UTI (urinary tract infection)     Past Surgical History:  Procedure Laterality Date  . ABDOMINAL HYSTERECTOMY    . PR ENDOVENOUS LASTER, 1ST VEIN Right 06/09/2017   Procedure: EVLA of Right GSV; Surgeon: Lauretta Grill, MD; Location: MAIN OR [facility]; Service: Vascular  . PR LIGATN LONG SAPHENOUS VEIN AT SEPH-FEM JUNC Right 06/09/2017   Procedure: LIGATION & DIVISION OF LONG SAPHENOUS VEIN AT SAPHENOFEMORAL JUNCTION, OR DISTAL INTERRUPTIONS; Surgeon: Lauretta Grill, MD; Location: MAIN OR [facility]; Service: Vascular  . PR PHLEB VEINS EXTREM TO 20 Right 06/09/2017   Procedure: STAB PHLEBECTOMY OF VARICOSE VEINS, 1 EXTREMITY: 10-20 STAB INCISIONS; Surgeon: Lauretta Grill, MD; Location: MAIN OR [facility]; Service: Vascular    There were no vitals filed for this visit.  Subjective  Assessment - 08/12/17 1252    Subjective  Patient reports that tape relieved pain and STM to IT band allowed her to sleep through the night.     Pertinent History  Patient had surgery on October 25th to remove veins in legs, tied saphenous leg off in upper thigh and removed deeper bulging veins. Pt. Stayed overnight at [facility] due to blood clot history. Went home the next day and on Sunday the 28th I had excruciating pain in R calf. Doppler performed on 29th and found a hematoma and placed in hospital for a week. Left hospital and went to [facility]. Had PT for walking for a week. Has a rollater to use in public but has started decreasing use in home. Right calf is wrapped and painful upon posterior calf  Muscular contraction. .    Limitations  Sitting;Lifting;Standing;Walking;House hold activities    How long can you sit comfortably?  30 minutes    How long can you stand comfortably?  10 minutes    How long can you walk comfortably?  when standing    Diagnostic tests  Doppler    Patient Stated Goals  Decrease pain and swelling and improve quality of walking.     Currently in Pain?  Yes    Pain Score  3     Pain Location  Calf    Pain Orientation  Right    Pain Descriptors /  Indicators  Aching;Tightness    Pain Type  Acute pain;Surgical pain    Pain Onset  1 to 4 weeks ago    Pain Frequency  Constant        Tape to lateral aspect of ankles: cover roll and leuko tape   Crane step ups 6" step 15x each leg   Airex pad: balloon taps     walk across unstable matt 2x  Walk across mat squatting down to pick up 4 cones, cues for keeping feet wide and bending at knees not at waist to pick up cone. 4x  Walk across unstable mat with hills and valleys made by airex pads 6x cues for widening BOS  Calf stretch half foam roller 60 seconds    Wall calf stretch   Stair stretch 60 seconds    ultrasound 3.3 mHz, 2 cycles 50% had warmer optional, intensity 1.5w/cm.   Pt. response to medical  necessity:  Patient will continue to benefit from skilled physical therapy to decrease pain, swelling, and improve mobility for increased QOL.                  PT Education - 08/12/17 1258    Education provided  Yes    Education Details  K tape, safe ambulation with proper body mechanics    Person(s) Educated  Patient    Methods  Explanation;Demonstration;Verbal cues    Comprehension  Verbalized understanding;Returned demonstration       PT Short Term Goals - 07/28/17 1313      PT SHORT TERM GOAL #1   Title  Patient will increase 10 meter walk test to >1.88ms as to improve gait speed for better community ambulation and to reduce fall risk.    Baseline  11/19: .77 m/s  12/13: 1.43 m/s    Time  2    Period  Weeks    Status  Achieved      PT SHORT TERM GOAL #2   Title  Patient will increase ABC scale score >60% to demonstrate better functional mobility and better confidence with ADLs.     Baseline  11/19: 53.1% 12/13: 76%    Time  2    Period  Weeks    Status  Achieved      PT SHORT TERM GOAL #3   Title  Patient will have circumfrential measurements of calves within 1 cm of each other for decreased swelling and pain relief    Baseline  R 32.5 L 28   R 28.5 L 24.5    Time  2    Period  Weeks    Status  Partially Met        PT Long Term Goals - 07/28/17 1311      PT LONG TERM GOAL #1   Title  Patient will increase ABC scale score >80% to demonstrate better functional mobility and better confidence with ADLs.     Baseline  11/19:  53.1% 12/13: 76.25%    Time  4    Period  Weeks    Status  Partially Met      PT LONG TERM GOAL #2   Title  Patient will report a worst pain of 3/10 on VAS in  R calf to improve tolerance with ADLs and reduced symptoms with activities.     Baseline  11/19: 7/10 pain, 12/13: worst pain 5/10     Time  4    Period  Weeks    Status  Partially Met  PT LONG TERM GOAL #3   Title  Patient will increase lower extremity functional  scale to >60/80 to demonstrate improved functional mobility and increased tolerance with ADLs.     Baseline  11/19: 40/80 12/13: 60/80    Time  4    Period  Weeks    Status  Achieved      PT LONG TERM GOAL #4   Title  Patient will ambulate with equal weight acceptance upon L and R LE's to demonstrate improved quality of movement    Baseline  decreased weight acceptance upon RLE>, lateral weight shift on R to lateral aspect of foot due to fear of activaiton of medial calf musculature.     Time  4    Period  Weeks    Status  Partially Met      PT LONG TERM GOAL #5   Title  Patient will ambulate 1644 ft with 6 minute walk test to meet age norm for return to previous level of function.     Baseline  11/21: 1080 ft 12/13: 1600 ft     Time  4    Period  Weeks    Status  Partially Met            Plan - 08/12/17 2017    Clinical Impression Statement  Patient demonstrated increased ankle stability when ambulating across unstable surfaces. Patient posterior calf musculature tight, however is limited in tissue extensibility at this time due to status of hematoma and arthritis pain in big toe. Patient will continue to benefit from skilled physical therapy to decrease pain, swelling, and improve mobility for increased QOL.     Rehab Potential  Fair    Clinical Impairments Affecting Rehab Potential  hematoma, DVTs, GERD, thyroid    PT Frequency  2x / week    PT Duration  4 weeks    PT Treatment/Interventions  ADLs/Self Care Home Management;Electrical Stimulation;Cryotherapy;Biofeedback;Iontophoresis 75m/ml Dexamethasone;Moist Heat;Ultrasound;Parrafin;Therapeutic exercise;Therapeutic activities;Functional mobility training;Stair training;Gait training;DME Instruction;Balance training;Neuromuscular re-education;Patient/family education;Compression bandaging;Manual techniques;Passive range of motion;Dry needling;Energy conservation;Visual/perceptual remediation/compensation    PT Next Visit Plan   ultrasound, balance, recruiting medial calf musculature.     Consulted and Agree with Plan of Care  Patient       Patient will benefit from skilled therapeutic intervention in order to improve the following deficits and impairments:  Abnormal gait, Decreased activity tolerance, Decreased balance, Decreased endurance, Decreased coordination, Decreased mobility, Decreased range of motion, Increased edema, Difficulty walking, Decreased strength, Impaired perceived functional ability, Impaired flexibility, Improper body mechanics, Postural dysfunction, Pain  Visit Diagnosis: Pain in right lower leg  Other abnormalities of gait and mobility     Problem List Patient Active Problem List   Diagnosis Date Noted  . Encounter for routine adult physical exam with abnormal findings 08/03/2017  . Mixed hyperlipidemia 08/03/2017  . Acute deep vein thrombosis (DVT) of proximal vein of lower extremity ([place] 06/21/2017  . Hematoma 06/21/2017  . Chronic sinusitis with recurrent bronchitis 02/03/2016  . Phlebitis and thrombophlebitis of femoral vein ([place] 01/26/2011  . 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

## 2017-08-15 ENCOUNTER — Ambulatory Visit: Payer: PPO

## 2017-08-17 ENCOUNTER — Ambulatory Visit: Payer: PPO

## 2017-08-24 ENCOUNTER — Ambulatory Visit: Payer: PPO | Attending: Vascular Surgery

## 2017-08-24 DIAGNOSIS — R2689 Other abnormalities of gait and mobility: Secondary | ICD-10-CM

## 2017-08-24 DIAGNOSIS — M79661 Pain in right lower leg: Secondary | ICD-10-CM | POA: Diagnosis not present

## 2017-08-24 NOTE — Therapy (Signed)
[place] MAIN [facility] SERVICES [address] [place], [place], [address] Phone: [phone]   Fax:  [phone]  Physical Therapy Treatment  Patient Details  Name: Marissa Wilson MRN: [id number] Date of Birth: [birth year] Referring Provider: Thomasene Mohair   Encounter Date: 08/24/2017  PT End of Session - 08/24/17 1409    Visit Number  12    Number of Visits  22    Date for PT Re-Evaluation  09/21/17    Authorization - Visit Number  4    Authorization - Number of Visits  10    PT Start Time  3244    PT Stop Time  1430    PT Time Calculation (min)  42 min    Equipment Utilized During Treatment  Gait belt    Activity Tolerance  Patient tolerated treatment well    Behavior During Therapy  [facility] for tasks assessed/performed       Past Medical History:  Diagnosis Date  . Asthma   . Gastrointestinal disorder   . GERD (gastroesophageal reflux disease)   . Hyperlipidemia, unspecified   . Hypertension   . Hypothyroid   . Osteoarthritis   . Sleep apnea   . Thyroid disease   . UTI (urinary tract infection)     Past Surgical History:  Procedure Laterality Date  . ABDOMINAL HYSTERECTOMY    . PR ENDOVENOUS LASTER, 1ST VEIN Right 06/09/2017   Procedure: EVLA of Right GSV; Surgeon: Lauretta Grill, MD; Location: MAIN OR [facility]; Service: Vascular  . PR LIGATN LONG SAPHENOUS VEIN AT SEPH-FEM JUNC Right 06/09/2017   Procedure: LIGATION & DIVISION OF LONG SAPHENOUS VEIN AT SAPHENOFEMORAL JUNCTION, OR DISTAL INTERRUPTIONS; Surgeon: Lauretta Grill, MD; Location: MAIN OR [facility]; Service: Vascular  . PR PHLEB VEINS EXTREM TO 20 Right 06/09/2017   Procedure: STAB PHLEBECTOMY OF VARICOSE VEINS, 1 EXTREMITY: 10-20 STAB INCISIONS; Surgeon: Lauretta Grill, MD; Location: MAIN OR [facility]; Service: Vascular    There were no vitals filed for this visit.  Subjective Assessment - 08/24/17 1352    Subjective  Patient has been sick and so  has been unable to attend last few PT sessions. Having more pain now due to inactivity. Now having R Hip pain.     Pertinent History  Patient had surgery on October 25th to remove veins in legs, tied saphenous leg off in upper thigh and removed deeper bulging veins. Pt. Stayed overnight at [facility] due to blood clot history. Went home the next day and on Sunday the 28th I had excruciating pain in R calf. Doppler performed on 29th and found a hematoma and placed in hospital for a week. Left hospital and went to [facility]. Had PT for walking for a week. Has a rollater to use in public but has started decreasing use in home. Right calf is wrapped and painful upon posterior calf  Muscular contraction. .    Limitations  Sitting;Lifting;Standing;Walking;House hold activities    How long can you sit comfortably?  30 minutes    How long can you stand comfortably?  10 minutes    How long can you walk comfortably?  when standing    Diagnostic tests  Doppler    Patient Stated Goals  Decrease pain and swelling and improve quality of walking.     Currently in Pain?  Yes    Pain Score  3     Pain Location  Calf    Pain Orientation  Right  Pain Descriptors / Indicators  Aching;Tightness    Pain Type  Acute pain    Pain Onset  1 to 4 weeks ago    Pain Frequency  Intermittent    Multiple Pain Sites  Yes    Pain Score  4    Pain Location  Hip    Pain Orientation  Right    Pain Descriptors / Indicators  Aching;Shooting    Pain Type  Acute pain    Pain Radiating Towards  shoot down to knee    Pain Onset  1 to 4 weeks ago    Pain Frequency  Constant    Effect of Pain on Daily Activities  affects sleep       Circumfrential measurements: L 24.5 R 25 ABC= 56% VAS : worst pain 5/10 at night LEFS= 59/80 Equal weight shift 6 min walk test: 1768   Standing exercises for prior to walking. : Hamstring stretch with stairs, quad / hip flexor stretch with stair, IT band wall stretch   IT band rollout sidelying  position RLE. Sidelying IT band stretch with overpressure Trigger point to R piriformis                        PT Education - 08/24/17 1408    Education provided  Yes    Education Details  progression twoards goals, POC, ambulatory mechanics, sleeping body mechanics, IT band     Person(s) Educated  Patient    Methods  Explanation;Demonstration;Verbal cues    Comprehension  Verbalized understanding;Returned demonstration;Verbal cues required       PT Short Term Goals - 08/24/17 1357      PT SHORT TERM GOAL #1   Title  Patient will increase 10 meter walk test to >1.81ms as to improve gait speed for better community ambulation and to reduce fall risk.    Baseline  11/19: .77 m/s  12/13: 1.43 m/s    Time  2    Period  Weeks    Status  Achieved      PT SHORT TERM GOAL #2   Title  Patient will increase ABC scale score >60% to demonstrate better functional mobility and better confidence with ADLs.     Baseline  11/19: 53.1% 12/13: 76%    Time  2    Period  Weeks    Status  Achieved      PT SHORT TERM GOAL #3   Title  Patient will have circumfrential measurements of calves within 1 cm of each other for decreased swelling and pain relief    Baseline  R 32.5 L 28   R 28.5 L 24.5;  1/9: R 25, L 24.5     Time  2    Period  Weeks    Status  Partially Met        PT Long Term Goals - 08/24/17 1407      PT LONG TERM GOAL #1   Title  Patient will increase ABC scale score >80% to demonstrate better functional mobility and better confidence with ADLs.     Baseline  11/19:  53.1% 12/13: 76.25%    Time  4    Period  Weeks    Status  Partially Met      PT LONG TERM GOAL #2   Title  Patient will report a worst pain of 3/10 on VAS in  R calf to improve tolerance with ADLs and reduced symptoms with activities.  Baseline  11/19: 7/10 pain, 12/13: worst pain 5/10 1/9:  5/10 at night     Time  4    Period  Weeks    Status  Partially Met      PT LONG TERM GOAL #3    Title  Patient will increase lower extremity functional scale to >60/80 to demonstrate improved functional mobility and increased tolerance with ADLs.     Baseline  11/19: 40/80 12/13: 60/80    Time  4    Period  Weeks    Status  Achieved      PT LONG TERM GOAL #4   Title  Patient will ambulate with equal weight acceptance upon L and R LE's to demonstrate improved quality of movement    Baseline  decreased weight acceptance upon RLE>, lateral weight shift on R to lateral aspect of foot due to fear of activaiton of medial calf musculature.     Time  4    Period  Weeks    Status  Partially Met      PT LONG TERM GOAL #5   Title  Patient will ambulate 1644 ft with 6 minute walk test to meet age norm for return to previous level of function.     Baseline  11/21: 1080 ft 12/13: 1600 ft  1/9: 1764     Time  4    Period  Weeks    Status  Achieved            Plan - 08/24/17 1833    Clinical Impression Statement  Patient progressing towards functional goals at this time, improved ambulatory functional capacity met with 1768 ft walked with 6 min walk test.  Circumferential measurement improving with L 24.5 R 25 demonstrating decreased calf edema. Patient continues to be challenged by weight acceptance over RLE due to pain. Patient will continue to benefit from skilled physical therapy to decrease pain, swelling, and improve mobility for increased QOL.     Rehab Potential  Fair    Clinical Impairments Affecting Rehab Potential  hematoma, DVTs, GERD, thyroid    PT Frequency  2x / week    PT Duration  4 weeks    PT Treatment/Interventions  ADLs/Self Care Home Management;Electrical Stimulation;Cryotherapy;Biofeedback;Iontophoresis 41m/ml Dexamethasone;Moist Heat;Ultrasound;Parrafin;Therapeutic exercise;Therapeutic activities;Functional mobility training;Stair training;Gait training;DME Instruction;Balance training;Neuromuscular re-education;Patient/family education;Compression bandaging;Manual  techniques;Passive range of motion;Dry needling;Energy conservation;Visual/perceptual remediation/compensation    PT Next Visit Plan  ultrasound, balance, recruiting medial calf musculature.     Consulted and Agree with Plan of Care  Patient       Patient will benefit from skilled therapeutic intervention in order to improve the following deficits and impairments:  Abnormal gait, Decreased activity tolerance, Decreased balance, Decreased endurance, Decreased coordination, Decreased mobility, Decreased range of motion, Increased edema, Difficulty walking, Decreased strength, Impaired perceived functional ability, Impaired flexibility, Improper body mechanics, Postural dysfunction, Pain  Visit Diagnosis: Pain in right lower leg  Other abnormalities of gait and mobility     Problem List Patient Active Problem List   Diagnosis Date Noted  . Encounter for routine adult physical exam with abnormal findings 08/03/2017  . Mixed hyperlipidemia 08/03/2017  . Acute deep vein thrombosis (DVT) of proximal vein of lower extremity ([place] 06/21/2017  . Hematoma 06/21/2017  . Chronic sinusitis with recurrent bronchitis 02/03/2016  . Phlebitis and thrombophlebitis of femoral vein ([place] 01/26/2011  . 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 [place], [place], [address] Phone: [phone]   Fax:  [phone]  Name: TIMESHA CERVANTEZ MRN: [id number] Date of Birth: 08-Dec-1945

## 2017-08-29 ENCOUNTER — Ambulatory Visit: Payer: PPO

## 2017-08-29 DIAGNOSIS — M79661 Pain in right lower leg: Secondary | ICD-10-CM | POA: Diagnosis not present

## 2017-08-29 DIAGNOSIS — R2689 Other abnormalities of gait and mobility: Secondary | ICD-10-CM

## 2017-08-29 NOTE — Therapy (Signed)
[place] MAIN [facility] SERVICES [address] [place], [place], [address] Phone: [phone]   Fax:  [phone]  Physical Therapy Treatment  Patient Details  Name: Marissa Wilson MRN: [id number] Date of Birth: 09/06/1945 Referring Provider: Thomasene Mohair   Encounter Date: 08/29/2017  PT End of Session - 08/29/17 1349    Visit Number  13    Number of Visits  22    Date for PT Re-Evaluation  09/21/17    PT Start Time  1344    PT Stop Time  1430    PT Time Calculation (min)  46 min    Equipment Utilized During Treatment  Gait belt    Activity Tolerance  Patient tolerated treatment well    Behavior During Therapy  [facility] for tasks assessed/performed       Past Medical History:  Diagnosis Date  . Asthma   . Gastrointestinal disorder   . GERD (gastroesophageal reflux disease)   . Hyperlipidemia, unspecified   . Hypertension   . Hypothyroid   . Osteoarthritis   . Sleep apnea   . Thyroid disease   . UTI (urinary tract infection)     Past Surgical History:  Procedure Laterality Date  . ABDOMINAL HYSTERECTOMY    . PR ENDOVENOUS LASTER, 1ST VEIN Right 06/09/2017   Procedure: EVLA of Right GSV; Surgeon: Lauretta Grill, MD; Location: MAIN OR [facility]; Service: Vascular  . PR LIGATN LONG SAPHENOUS VEIN AT SEPH-FEM JUNC Right 06/09/2017   Procedure: LIGATION & DIVISION OF LONG SAPHENOUS VEIN AT SAPHENOFEMORAL JUNCTION, OR DISTAL INTERRUPTIONS; Surgeon: Lauretta Grill, MD; Location: MAIN OR [facility]; Service: Vascular  . PR PHLEB VEINS EXTREM TO 20 Right 06/09/2017   Procedure: STAB PHLEBECTOMY OF VARICOSE VEINS, 1 EXTREMITY: 10-20 STAB INCISIONS; Surgeon: Lauretta Grill, MD; Location: MAIN OR [facility]; Service: Vascular    There were no vitals filed for this visit.    Subjective Assessment - 08/29/17 1348    Subjective  Patient joined weight watchers today. Has been using a rolling stick on IT band at home to help with  lateral leg pain.     Pertinent History  Patient had surgery on October 25th to remove veins in legs, tied saphenous leg off in upper thigh and removed deeper bulging veins. Pt. Stayed overnight at [facility] due to blood clot history. Went home the next day and on Sunday the 28th I had excruciating pain in R calf. Doppler performed on 29th and found a hematoma and placed in hospital for a week. Left hospital and went to [facility]. Had PT for walking for a week. Has a rollater to use in public but has started decreasing use in home. Right calf is wrapped and painful upon posterior calf  Muscular contraction. .    Limitations  Sitting;Lifting;Standing;Walking;House hold activities    How long can you sit comfortably?  30 minutes    How long can you stand comfortably?  10 minutes    How long can you walk comfortably?  when standing    Diagnostic tests  Doppler    Patient Stated Goals  Decrease pain and swelling and improve quality of walking.     Currently in Pain?  Yes    Pain Score  3     Pain Location  Calf    Pain Orientation  Right    Pain Descriptors / Indicators  Aching;Tightness    Pain Type  Acute pain    Pain Onset  1 to  4 weeks ago    Pain Frequency  Intermittent       Nustep Lvl 4 4 minutes  Lunges onto bosu ball 12x each leg  Bosu ball: black side up: balance Bosu ball squat: 10x , cues for putting weight through heels  Seated Adduction : squeezes 10x 5 second holds   Seated adduction with LAQ 12x    GTB df 12x   seated abduction GTB 5 second holds 12x    Donning/doffing compression garment.  ultrasound 3.3 mHz, 2 cycles 50% had warmer optional, intensity 1.5w/cm.   Pt. response to medical necessity:  Patient will continue to benefit from skilled physical therapy to decrease pain, swelling, and improve mobility for increased QOL.                           PT Education - 08/29/17 1349    Education provided  Yes    Education Details  ambulatory  mechanics, strength and mobility for RLE.     Person(s) Educated  Patient    Methods  Explanation;Demonstration;Verbal cues    Comprehension  Verbalized understanding;Returned demonstration       PT Short Term Goals - 08/24/17 1357      PT SHORT TERM GOAL #1   Title  Patient will increase 10 meter walk test to >1.67ms as to improve gait speed for better community ambulation and to reduce fall risk.    Baseline  11/19: .77 m/s  12/13: 1.43 m/s    Time  2    Period  Weeks    Status  Achieved      PT SHORT TERM GOAL #2   Title  Patient will increase ABC scale score >60% to demonstrate better functional mobility and better confidence with ADLs.     Baseline  11/19: 53.1% 12/13: 76%    Time  2    Period  Weeks    Status  Achieved      PT SHORT TERM GOAL #3   Title  Patient will have circumfrential measurements of calves within 1 cm of each other for decreased swelling and pain relief    Baseline  R 32.5 L 28   R 28.5 L 24.5;  1/9: R 25, L 24.5     Time  2    Period  Weeks    Status  Partially Met        PT Long Term Goals - 08/24/17 1407      PT LONG TERM GOAL #1   Title  Patient will increase ABC scale score >80% to demonstrate better functional mobility and better confidence with ADLs.     Baseline  11/19:  53.1% 12/13: 76.25%    Time  4    Period  Weeks    Status  Partially Met      PT LONG TERM GOAL #2   Title  Patient will report a worst pain of 3/10 on VAS in  R calf to improve tolerance with ADLs and reduced symptoms with activities.     Baseline  11/19: 7/10 pain, 12/13: worst pain 5/10 1/9:  5/10 at night     Time  4    Period  Weeks    Status  Partially Met      PT LONG TERM GOAL #3   Title  Patient will increase lower extremity functional scale to >60/80 to demonstrate improved functional mobility and increased tolerance with ADLs.     Baseline  11/19: 40/80 12/13: 60/80    Time  4    Period  Weeks    Status  Achieved      PT LONG TERM GOAL #4   Title   Patient will ambulate with equal weight acceptance upon L and R LE's to demonstrate improved quality of movement    Baseline  decreased weight acceptance upon RLE>, lateral weight shift on R to lateral aspect of foot due to fear of activaiton of medial calf musculature.     Time  4    Period  Weeks    Status  Partially Met      PT LONG TERM GOAL #5   Title  Patient will ambulate 1644 ft with 6 minute walk test to meet age norm for return to previous level of function.     Baseline  11/21: 1080 ft 12/13: 1600 ft  1/9: 1764     Time  4    Period  Weeks    Status  Achieved            Plan - 08/29/17 1610    Clinical Impression Statement  Patient ambulatory mechanics improved after ultrasound application, with greater step length and heel strike upon initial contact. Patient adductor weakness noted with fatigue upon initial strengthening interventions.  Patient will continue to benefit from skilled physical therapy to decrease pain, swelling, and improve mobility for increased QOL.     Rehab Potential  Fair    Clinical Impairments Affecting Rehab Potential  hematoma, DVTs, GERD, thyroid    PT Frequency  2x / week    PT Duration  4 weeks    PT Treatment/Interventions  ADLs/Self Care Home Management;Electrical Stimulation;Cryotherapy;Biofeedback;Iontophoresis 60m/ml Dexamethasone;Moist Heat;Ultrasound;Parrafin;Therapeutic exercise;Therapeutic activities;Functional mobility training;Stair training;Gait training;DME Instruction;Balance training;Neuromuscular re-education;Patient/family education;Compression bandaging;Manual techniques;Passive range of motion;Dry needling;Energy conservation;Visual/perceptual remediation/compensation    PT Next Visit Plan  ultrasound, balance, recruiting medial calf musculature.     Consulted and Agree with Plan of Care  Patient       Patient will benefit from skilled therapeutic intervention in order to improve the following deficits and impairments:   Abnormal gait, Decreased activity tolerance, Decreased balance, Decreased endurance, Decreased coordination, Decreased mobility, Decreased range of motion, Increased edema, Difficulty walking, Decreased strength, Impaired perceived functional ability, Impaired flexibility, Improper body mechanics, Postural dysfunction, Pain  Visit Diagnosis: Pain in right lower leg  Other abnormalities of gait and mobility     Problem List Patient Active Problem List   Diagnosis Date Noted  . Encounter for routine adult physical exam with abnormal findings 08/03/2017  . Mixed hyperlipidemia 08/03/2017  . Acute deep vein thrombosis (DVT) of proximal vein of lower extremity ([facility] 06/21/2017  . Hematoma 06/21/2017  . Chronic sinusitis with recurrent bronchitis 02/03/2016  . Phlebitis and thrombophlebitis of femoral vein ([place] 01/26/2011  . 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

## 2017-08-31 ENCOUNTER — Ambulatory Visit: Payer: PPO

## 2017-08-31 DIAGNOSIS — R2689 Other abnormalities of gait and mobility: Secondary | ICD-10-CM

## 2017-08-31 DIAGNOSIS — M79661 Pain in right lower leg: Secondary | ICD-10-CM

## 2017-08-31 NOTE — Therapy (Signed)
[place] MAIN [facility] SERVICES [address] [place], [place], [address] Phone: [phone]   Fax:  [phone]  Physical Therapy Treatment  Patient Details  Name: Marissa Wilson MRN: [id number] Date of Birth: Sep 22, 1945 Referring Provider: Thomasene Mohair   Encounter Date: 08/31/2017  PT End of Session - 08/31/17 1743    Visit Number  14    Number of Visits  22    Date for PT Re-Evaluation  09/21/17    PT Start Time  7026    PT Stop Time  1430    PT Time Calculation (min)  38 min    Equipment Utilized During Treatment  Gait belt    Activity Tolerance  Patient tolerated treatment well    Behavior During Therapy  [facility] for tasks assessed/performed       Past Medical History:  Diagnosis Date  . Asthma   . Gastrointestinal disorder   . GERD (gastroesophageal reflux disease)   . Hyperlipidemia, unspecified   . Hypertension   . Hypothyroid   . Osteoarthritis   . Sleep apnea   . Thyroid disease   . UTI (urinary tract infection)     Past Surgical History:  Procedure Laterality Date  . ABDOMINAL HYSTERECTOMY    . PR ENDOVENOUS LASTER, 1ST VEIN Right 06/09/2017   Procedure: EVLA of Right GSV; Surgeon: Lauretta Grill, MD; Location: MAIN OR [facility]; Service: Vascular  . PR LIGATN LONG SAPHENOUS VEIN AT SEPH-FEM JUNC Right 06/09/2017   Procedure: LIGATION & DIVISION OF LONG SAPHENOUS VEIN AT SAPHENOFEMORAL JUNCTION, OR DISTAL INTERRUPTIONS; Surgeon: Lauretta Grill, MD; Location: MAIN OR [facility]; Service: Vascular  . PR PHLEB VEINS EXTREM TO 20 Right 06/09/2017   Procedure: STAB PHLEBECTOMY OF VARICOSE VEINS, 1 EXTREMITY: 10-20 STAB INCISIONS; Surgeon: Lauretta Grill, MD; Location: MAIN OR [facility]; Service: Vascular    There were no vitals filed for this visit.  Subjective Assessment - 08/31/17 1353    Subjective  Patient running a little late due to running into an old friend on her way in. Has already lost a pound.   Hasn't been doing IT band rollout and feeling more pain in lateral thigh.     Pertinent History  Patient had surgery on October 25th to remove veins in legs, tied saphenous leg off in upper thigh and removed deeper bulging veins. Pt. Stayed overnight at [facility] due to blood clot history. Went home the next day and on Sunday the 28th I had excruciating pain in R calf. Doppler performed on 29th and found a hematoma and placed in hospital for a week. Left hospital and went to [facility]. Had PT for walking for a week. Has a rollater to use in public but has started decreasing use in home. Right calf is wrapped and painful upon posterior calf  Muscular contraction. .    Limitations  Sitting;Lifting;Standing;Walking;House hold activities    How long can you sit comfortably?  30 minutes    How long can you stand comfortably?  10 minutes    How long can you walk comfortably?  when standing    Diagnostic tests  Doppler    Patient Stated Goals  Decrease pain and swelling and improve quality of walking.     Currently in Pain?  Yes    Pain Score  2     Pain Location  Calf    Pain Orientation  Right    Pain Descriptors / Indicators  Aching;Tightness    Pain Type  Acute pain    Pain Onset  1 to 4 weeks ago    Pain Score  4    Pain Location  Hip    Pain Orientation  Right;Left    Pain Descriptors / Indicators  Aching;Shooting    Pain Type  Acute pain     ProStretch 2x 60 seconds RLE   IT band rollout R and LLE   prone hip flexor stretch bilaterally 2x 60 seconds (BLE)   Donning/doffing compression garment.   ultrasound 3.3 mHz, 2 cycles 50% had warmer optional, intensity 1.5w/cm.  IT band standing stretch 2x 30 seconds  Hip flexor lunge stretch in standing 2x 30 seconds  Supine hip flexor stretch for home  BLE 30 seconds   Pt. response to medical necessity:  Patient will continue to benefit from skilled physical therapy to decrease pain, swelling, and improve mobility for increased  QOL.                        PT Education - 08/31/17 1743    Education provided  Yes    Education Details  walking on sand while at beach, hip flexor and IT band stretch    Person(s) Educated  Patient    Methods  Explanation;Demonstration;Verbal cues    Comprehension  Verbalized understanding;Returned demonstration       PT Short Term Goals - 08/24/17 1357      PT SHORT TERM GOAL #1   Title  Patient will increase 10 meter walk test to >1.39ms as to improve gait speed for better community ambulation and to reduce fall risk.    Baseline  11/19: .77 m/s  12/13: 1.43 m/s    Time  2    Period  Weeks    Status  Achieved      PT SHORT TERM GOAL #2   Title  Patient will increase ABC scale score >60% to demonstrate better functional mobility and better confidence with ADLs.     Baseline  11/19: 53.1% 12/13: 76%    Time  2    Period  Weeks    Status  Achieved      PT SHORT TERM GOAL #3   Title  Patient will have circumfrential measurements of calves within 1 cm of each other for decreased swelling and pain relief    Baseline  R 32.5 L 28   R 28.5 L 24.5;  1/9: R 25, L 24.5     Time  2    Period  Weeks    Status  Partially Met        PT Long Term Goals - 08/24/17 1407      PT LONG TERM GOAL #1   Title  Patient will increase ABC scale score >80% to demonstrate better functional mobility and better confidence with ADLs.     Baseline  11/19:  53.1% 12/13: 76.25%    Time  4    Period  Weeks    Status  Partially Met      PT LONG TERM GOAL #2   Title  Patient will report a worst pain of 3/10 on VAS in  R calf to improve tolerance with ADLs and reduced symptoms with activities.     Baseline  11/19: 7/10 pain, 12/13: worst pain 5/10 1/9:  5/10 at night     Time  4    Period  Weeks    Status  Partially Met      PT LONG  TERM GOAL #3   Title  Patient will increase lower extremity functional scale to >60/80 to demonstrate improved functional mobility and increased  tolerance with ADLs.     Baseline  11/19: 40/80 12/13: 60/80    Time  4    Period  Weeks    Status  Achieved      PT LONG TERM GOAL #4   Title  Patient will ambulate with equal weight acceptance upon L and R LE's to demonstrate improved quality of movement    Baseline  decreased weight acceptance upon RLE>, lateral weight shift on R to lateral aspect of foot due to fear of activaiton of medial calf musculature.     Time  4    Period  Weeks    Status  Partially Met      PT LONG TERM GOAL #5   Title  Patient will ambulate 1644 ft with 6 minute walk test to meet age norm for return to previous level of function.     Baseline  11/21: 1080 ft 12/13: 1600 ft  1/9: 1764     Time  4    Period  Weeks    Status  Achieved            Plan - 08/31/17 1747    Clinical Impression Statement  Patient educated on standing stretches to perform while at the beach. IT band pain limited standing interventions. Patient pain reduced upon manual tx and stretching for prolonged periods.  Patient will continue to benefit from skilled physical therapy to decrease pain, swelling, and improve mobility for increased QOL.    Rehab Potential  Fair    Clinical Impairments Affecting Rehab Potential  hematoma, DVTs, GERD, thyroid    PT Frequency  2x / week    PT Duration  4 weeks    PT Treatment/Interventions  ADLs/Self Care Home Management;Electrical Stimulation;Cryotherapy;Biofeedback;Iontophoresis 70m/ml Dexamethasone;Moist Heat;Ultrasound;Parrafin;Therapeutic exercise;Therapeutic activities;Functional mobility training;Stair training;Gait training;DME Instruction;Balance training;Neuromuscular re-education;Patient/family education;Compression bandaging;Manual techniques;Passive range of motion;Dry needling;Energy conservation;Visual/perceptual remediation/compensation    PT Next Visit Plan  ultrasound, balance, recruiting medial calf musculature.     Consulted and Agree with Plan of Care  Patient        Patient will benefit from skilled therapeutic intervention in order to improve the following deficits and impairments:  Abnormal gait, Decreased activity tolerance, Decreased balance, Decreased endurance, Decreased coordination, Decreased mobility, Decreased range of motion, Increased edema, Difficulty walking, Decreased strength, Impaired perceived functional ability, Impaired flexibility, Improper body mechanics, Postural dysfunction, Pain  Visit Diagnosis: Pain in right lower leg  Other abnormalities of gait and mobility     Problem List Patient Active Problem List   Diagnosis Date Noted  . Encounter for routine adult physical exam with abnormal findings 08/03/2017  . Mixed hyperlipidemia 08/03/2017  . Acute deep vein thrombosis (DVT) of proximal vein of lower extremity ([place] 06/21/2017  . Hematoma 06/21/2017  . Chronic sinusitis with recurrent bronchitis 02/03/2016  . Phlebitis and thrombophlebitis of femoral vein ([place] 01/26/2011  . 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

## 2017-09-06 ENCOUNTER — Ambulatory Visit: Payer: PPO

## 2017-09-06 DIAGNOSIS — M79661 Pain in right lower leg: Secondary | ICD-10-CM | POA: Diagnosis not present

## 2017-09-06 DIAGNOSIS — R2689 Other abnormalities of gait and mobility: Secondary | ICD-10-CM

## 2017-09-06 NOTE — Therapy (Signed)
[place] MAIN [facility] SERVICES [address] [place], [place], [address] Phone: [phone]   Fax:  [phone]  Physical Therapy Treatment  Patient Details  Name: Marissa Wilson MRN: [id number] Date of Birth: 27-Sep-1945 Referring Provider: Thomasene Mohair   Encounter Date: 09/06/2017  PT End of Session - 09/06/17 1504    Visit Number  15    Number of Visits  22    Date for PT Re-Evaluation  09/21/17    PT Start Time  1500    PT Stop Time  1545    PT Time Calculation (min)  45 min    Equipment Utilized During Treatment  Gait belt    Activity Tolerance  Patient tolerated treatment well    Behavior During Therapy  [facility] for tasks assessed/performed       Past Medical History:  Diagnosis Date  . Asthma   . Gastrointestinal disorder   . GERD (gastroesophageal reflux disease)   . Hyperlipidemia, unspecified   . Hypertension   . Hypothyroid   . Osteoarthritis   . Sleep apnea   . Thyroid disease   . UTI (urinary tract infection)     Past Surgical History:  Procedure Laterality Date  . ABDOMINAL HYSTERECTOMY    . PR ENDOVENOUS LASTER, 1ST VEIN Right 06/09/2017   Procedure: EVLA of Right GSV; Surgeon: Lauretta Grill, MD; Location: MAIN OR [facility]; Service: Vascular  . PR LIGATN LONG SAPHENOUS VEIN AT SEPH-FEM JUNC Right 06/09/2017   Procedure: LIGATION & DIVISION OF LONG SAPHENOUS VEIN AT SAPHENOFEMORAL JUNCTION, OR DISTAL INTERRUPTIONS; Surgeon: Lauretta Grill, MD; Location: MAIN OR [facility]; Service: Vascular  . PR PHLEB VEINS EXTREM TO 20 Right 06/09/2017   Procedure: STAB PHLEBECTOMY OF VARICOSE VEINS, 1 EXTREMITY: 10-20 STAB INCISIONS; Surgeon: Lauretta Grill, MD; Location: MAIN OR [facility]; Service: Vascular    There were no vitals filed for this visit.  Subjective Assessment - 09/06/17 1503    Subjective  Patient continues to have IT band pain that only occurs when she is sleeping now. Reports compliance with  HEP and been foam rolling.  No calf pain today    Pertinent History  Patient had surgery on October 25th to remove veins in legs, tied saphenous leg off in upper thigh and removed deeper bulging veins. Pt. Stayed overnight at [facility] due to blood clot history. Went home the next day and on Sunday the 28th I had excruciating pain in R calf. Doppler performed on 29th and found a hematoma and placed in hospital for a week. Left hospital and went to [facility]. Had PT for walking for a week. Has a rollater to use in public but has started decreasing use in home. Right calf is wrapped and painful upon posterior calf  Muscular contraction. .    Limitations  Sitting;Lifting;Standing;Walking;House hold activities    How long can you sit comfortably?  30 minutes    How long can you stand comfortably?  10 minutes    How long can you walk comfortably?  when standing    Diagnostic tests  Doppler    Patient Stated Goals  Decrease pain and swelling and improve quality of walking.     Currently in Pain?  No/denies       Nustep Lvl 4 4 minutes     Lunges onto bosu ball 12x each leg   Bosu ball: black side up: balance Bosu ball squat: 10x , cues for putting weight through heels   RTB  around toes, side stepping in squat position // bars 4x.      Donning/doffing compression garment.  IT band rollout   Piriformis stretch 3x 20 seconds supine with cueing for performing at home.    ultrasound 3.3 mHz, 2 cycles 50% had warmer optional, intensity 1.5w/cm.    Pt. response to medical necessity:  Patient will continue to benefit from skilled physical therapy to decrease pain, swelling, and improve mobility for increased QOL                       PT Education - 09/06/17 1504    Education provided  Yes    Education Details  IT band stretch, functional balance and strengthening     Person(s) Educated  Patient    Methods  Explanation;Demonstration;Verbal cues    Comprehension  Verbalized  understanding;Returned demonstration       PT Short Term Goals - 08/24/17 1357      PT SHORT TERM GOAL #1   Title  Patient will increase 10 meter walk test to >1.54ms as to improve gait speed for better community ambulation and to reduce fall risk.    Baseline  11/19: .77 m/s  12/13: 1.43 m/s    Time  2    Period  Weeks    Status  Achieved      PT SHORT TERM GOAL #2   Title  Patient will increase ABC scale score >60% to demonstrate better functional mobility and better confidence with ADLs.     Baseline  11/19: 53.1% 12/13: 76%    Time  2    Period  Weeks    Status  Achieved      PT SHORT TERM GOAL #3   Title  Patient will have circumfrential measurements of calves within 1 cm of each other for decreased swelling and pain relief    Baseline  R 32.5 L 28   R 28.5 L 24.5;  1/9: R 25, L 24.5     Time  2    Period  Weeks    Status  Partially Met        PT Long Term Goals - 08/24/17 1407      PT LONG TERM GOAL #1   Title  Patient will increase ABC scale score >80% to demonstrate better functional mobility and better confidence with ADLs.     Baseline  11/19:  53.1% 12/13: 76.25%    Time  4    Period  Weeks    Status  Partially Met      PT LONG TERM GOAL #2   Title  Patient will report a worst pain of 3/10 on VAS in  R calf to improve tolerance with ADLs and reduced symptoms with activities.     Baseline  11/19: 7/10 pain, 12/13: worst pain 5/10 1/9:  5/10 at night     Time  4    Period  Weeks    Status  Partially Met      PT LONG TERM GOAL #3   Title  Patient will increase lower extremity functional scale to >60/80 to demonstrate improved functional mobility and increased tolerance with ADLs.     Baseline  11/19: 40/80 12/13: 60/80    Time  4    Period  Weeks    Status  Achieved      PT LONG TERM GOAL #4   Title  Patient will ambulate with equal weight acceptance upon L and R LE's  to demonstrate improved quality of movement    Baseline  decreased weight acceptance  upon RLE>, lateral weight shift on R to lateral aspect of foot due to fear of activaiton of medial calf musculature.     Time  4    Period  Weeks    Status  Partially Met      PT LONG TERM GOAL #5   Title  Patient will ambulate 1644 ft with 6 minute walk test to meet age norm for return to previous level of function.     Baseline  11/21: 1080 ft 12/13: 1600 ft  1/9: 1764     Time  4    Period  Weeks    Status  Achieved            Plan - 09/06/17 1638    Clinical Impression Statement  Patient demonstrates improved gait mechanics with improved weight acceptance bilaterally with good bony alignment of ankles and foot for smooth reciprocal gait pattern. Static balance on dynamic surface continues to be challenging with patient improving. Patient will continue to benefit from skilled physical therapy to decrease pain, swelling, and improve mobility for increased QOL    Rehab Potential  Fair    Clinical Impairments Affecting Rehab Potential  hematoma, DVTs, GERD, thyroid    PT Frequency  2x / week    PT Duration  4 weeks    PT Treatment/Interventions  ADLs/Self Care Home Management;Electrical Stimulation;Cryotherapy;Biofeedback;Iontophoresis 10m/ml Dexamethasone;Moist Heat;Ultrasound;Parrafin;Therapeutic exercise;Therapeutic activities;Functional mobility training;Stair training;Gait training;DME Instruction;Balance training;Neuromuscular re-education;Patient/family education;Compression bandaging;Manual techniques;Passive range of motion;Dry needling;Energy conservation;Visual/perceptual remediation/compensation    PT Next Visit Plan  ultrasound, balance, recruiting medial calf musculature.     Consulted and Agree with Plan of Care  Patient       Patient will benefit from skilled therapeutic intervention in order to improve the following deficits and impairments:  Abnormal gait, Decreased activity tolerance, Decreased balance, Decreased endurance, Decreased coordination, Decreased mobility,  Decreased range of motion, Increased edema, Difficulty walking, Decreased strength, Impaired perceived functional ability, Impaired flexibility, Improper body mechanics, Postural dysfunction, Pain  Visit Diagnosis: Pain in right lower leg  Other abnormalities of gait and mobility     Problem List Patient Active Problem List   Diagnosis Date Noted  . Encounter for routine adult physical exam with abnormal findings 08/03/2017  . Mixed hyperlipidemia 08/03/2017  . Acute deep vein thrombosis (DVT) of proximal vein of lower extremity ([place] 06/21/2017  . Hematoma 06/21/2017  . Chronic sinusitis with recurrent bronchitis 02/03/2016  . Phlebitis and thrombophlebitis of femoral vein ([place] 01/26/2011  . 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RS[address] Patric[address] [place] [address]Phone: [phone]  Fax:  [phone] Name: SMEAGHEN VECCHIARELLIMRN: [id number] of Birth: 101/08/1946

## 2017-09-08 ENCOUNTER — Ambulatory Visit: Payer: PPO

## 2017-09-08 DIAGNOSIS — M79661 Pain in right lower leg: Secondary | ICD-10-CM

## 2017-09-08 DIAGNOSIS — R2689 Other abnormalities of gait and mobility: Secondary | ICD-10-CM

## 2017-09-08 NOTE — Therapy (Signed)
[place] MAIN [facility] SERVICES [address] [place], [place], [address] Phone: [phone]   Fax:  [phone]  Physical Therapy Treatment  Patient Details  Name: Marissa Wilson MRN: [id number] Date of Birth: 1945/11/26 Referring Provider: Thomasene Mohair   Encounter Date: 09/08/2017  PT End of Session - 09/08/17 1436    Visit Number  16    Number of Visits  22    Date for PT Re-Evaluation  09/21/17    PT Start Time  1350    PT Stop Time  1430    PT Time Calculation (min)  40 min    Equipment Utilized During Treatment  Gait belt    Activity Tolerance  Patient tolerated treatment well    Behavior During Therapy  [facility] for tasks assessed/performed       Past Medical History:  Diagnosis Date  . Asthma   . Gastrointestinal disorder   . GERD (gastroesophageal reflux disease)   . Hyperlipidemia, unspecified   . Hypertension   . Hypothyroid   . Osteoarthritis   . Sleep apnea   . Thyroid disease   . UTI (urinary tract infection)     Past Surgical History:  Procedure Laterality Date  . ABDOMINAL HYSTERECTOMY    . PR ENDOVENOUS LASTER, 1ST VEIN Right 06/09/2017   Procedure: EVLA of Right GSV; Surgeon: Lauretta Grill, MD; Location: MAIN OR [facility]; Service: Vascular  . PR LIGATN LONG SAPHENOUS VEIN AT SEPH-FEM JUNC Right 06/09/2017   Procedure: LIGATION & DIVISION OF LONG SAPHENOUS VEIN AT SAPHENOFEMORAL JUNCTION, OR DISTAL INTERRUPTIONS; Surgeon: Lauretta Grill, MD; Location: MAIN OR [facility]; Service: Vascular  . PR PHLEB VEINS EXTREM TO 20 Right 06/09/2017   Procedure: STAB PHLEBECTOMY OF VARICOSE VEINS, 1 EXTREMITY: 10-20 STAB INCISIONS; Surgeon: Lauretta Grill, MD; Location: MAIN [facility]; Service: Vascular    There were no vitals filed for this visit.  Subjective Assessment - 09/08/17 1431    Subjective  Patient able to sleep last night for first night due to decreased IT band pain, calf pain not noticeable  at this time. Compliant with HEP and has noted improvement in stability.     Pertinent History  Patient had surgery on October 25th to remove veins in legs, tied saphenous leg off in upper thigh and removed deeper bulging veins. Pt. Stayed overnight at [facility] due to blood clot history. Went home the next day and on Sunday the 28th I had excruciating pain in R calf. Doppler performed on 29th and found a hematoma and placed in hospital for a week. Left hospital and went to [facility]. Had PT for walking for a week. Has a rollater to use in public but has started decreasing use in home. Right calf is wrapped and painful upon posterior calf  Muscular contraction. .    Limitations  Sitting;Lifting;Standing;Walking;House hold activities    How long can you sit comfortably?  30 minutes    How long can you stand comfortably?  10 minutes    How long can you walk comfortably?  when standing    Diagnostic tests  Doppler    Patient Stated Goals  Decrease pain and swelling and improve quality of walking.     Currently in Pain?  Yes    Pain Score  3     Pain Location  Hip    Pain Orientation  Right    Pain Descriptors / Indicators  Aching;Tightness    Pain Type  Acute pain  Pain Onset  1 to 4 weeks ago    Pain Frequency  Intermittent       Resisted walking; #12 forward 3x 7.5 sidways each way 3x , backwards 7.5 3x  Bosu ball black side up squats 10x   Bosu ball blue side up SLS with finger tip support   Bosu bal lblue side up side squats 12x each leg UE support  Half foam roller achilles stretch 5x 10 seconds with heel raise in between.   Green dyna disc df and pf 15x    ultrasound 3.3 mHz, 2 cycles 50% had warmer optional, intensity 1.5w/cm.    Pt. response to medical necessity:  Patient will continue to benefit from skilled physical therapy to decrease pain, swelling, and improve mobility for increased QOL                         PT Education - 09/08/17 1433    Education  provided  Yes    Education Details  functional dynamic balance and strengthening.     Person(s) Educated  Patient    Methods  Explanation;Demonstration;Verbal cues    Comprehension  Verbalized understanding;Returned demonstration       PT Short Term Goals - 08/24/17 1357      PT SHORT TERM GOAL #1   Title  Patient will increase 10 meter walk test to >1.29ms as to improve gait speed for better community ambulation and to reduce fall risk.    Baseline  11/19: .77 m/s  12/13: 1.43 m/s    Time  2    Period  Weeks    Status  Achieved      PT SHORT TERM GOAL #2   Title  Patient will increase ABC scale score >60% to demonstrate better functional mobility and better confidence with ADLs.     Baseline  11/19: 53.1% 12/13: 76%    Time  2    Period  Weeks    Status  Achieved      PT SHORT TERM GOAL #3   Title  Patient will have circumfrential measurements of calves within 1 cm of each other for decreased swelling and pain relief    Baseline  R 32.5 L 28   R 28.5 L 24.5;  1/9: R 25, L 24.5     Time  2    Period  Weeks    Status  Partially Met        PT Long Term Goals - 08/24/17 1407      PT LONG TERM GOAL #1   Title  Patient will increase ABC scale score >80% to demonstrate better functional mobility and better confidence with ADLs.     Baseline  11/19:  53.1% 12/13: 76.25%    Time  4    Period  Weeks    Status  Partially Met      PT LONG TERM GOAL #2   Title  Patient will report a worst pain of 3/10 on VAS in  R calf to improve tolerance with ADLs and reduced symptoms with activities.     Baseline  11/19: 7/10 pain, 12/13: worst pain 5/10 1/9:  5/10 at night     Time  4    Period  Weeks    Status  Partially Met      PT LONG TERM GOAL #3   Title  Patient will increase lower extremity functional scale to >60/80 to demonstrate improved functional mobility and increased tolerance  with ADLs.     Baseline  11/19: 40/80 12/13: 60/80    Time  4    Period  Weeks    Status   Achieved      PT LONG TERM GOAL #4   Title  Patient will ambulate with equal weight acceptance upon L and R LE's to demonstrate improved quality of movement    Baseline  decreased weight acceptance upon RLE>, lateral weight shift on R to lateral aspect of foot due to fear of activaiton of medial calf musculature.     Time  4    Period  Weeks    Status  Partially Met      PT LONG TERM GOAL #5   Title  Patient will ambulate 1644 ft with 6 minute walk test to meet age norm for return to previous level of function.     Baseline  11/21: 1080 ft 12/13: 1600 ft  1/9: 1764     Time  4    Period  Weeks    Status  Achieved            Plan - 09/08/17 1438    Clinical Impression Statement  Patient progressing with dynamic balance and stability on stable and unstable surfaces. Challenged by resisted walking requiring occasional support to retain COM. Calf pain not noticeable today with decreased hip pain additionally.  Patient will continue to benefit from skilled physical therapy to decrease pain, swelling, and improve mobility for increased QOL    Rehab Potential  Fair    Clinical Impairments Affecting Rehab Potential  hematoma, DVTs, GERD, thyroid    PT Frequency  2x / week    PT Duration  4 weeks    PT Treatment/Interventions  ADLs/Self Care Home Management;Electrical Stimulation;Cryotherapy;Biofeedback;Iontophoresis 82m/ml Dexamethasone;Moist Heat;Ultrasound;Parrafin;Therapeutic exercise;Therapeutic activities;Functional mobility training;Stair training;Gait training;DME Instruction;Balance training;Neuromuscular re-education;Patient/family education;Compression bandaging;Manual techniques;Passive range of motion;Dry needling;Energy conservation;Visual/perceptual remediation/compensation    PT Next Visit Plan  ultrasound, balance, recruiting medial calf musculature.     Consulted and Agree with Plan of Care  Patient       Patient will benefit from skilled therapeutic intervention in order  to improve the following deficits and impairments:  Abnormal gait, Decreased activity tolerance, Decreased balance, Decreased endurance, Decreased coordination, Decreased mobility, Decreased range of motion, Increased edema, Difficulty walking, Decreased strength, Impaired perceived functional ability, Impaired flexibility, Improper body mechanics, Postural dysfunction, Pain  Visit Diagnosis: Pain in right lower leg  Other abnormalities of gait and mobility     Problem List Patient Active Problem List   Diagnosis Date Noted  . Encounter for routine adult physical exam with abnormal findings 08/03/2017  . Mixed hyperlipidemia 08/03/2017  . Acute deep vein thrombosis (DVT) of proximal vein of lower extremity (HC[place] 06/21/2017  . Hematoma 06/21/2017  . Chronic sinusitis with recurrent bronchitis 02/03/2016  . Phlebitis and thrombophlebitis of femoral vein (HM[place] 01/26/2011  . 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

## 2017-09-12 DIAGNOSIS — M7989 Other specified soft tissue disorders: Secondary | ICD-10-CM | POA: Diagnosis not present

## 2017-09-13 ENCOUNTER — Ambulatory Visit: Payer: PPO

## 2017-09-13 DIAGNOSIS — M79661 Pain in right lower leg: Secondary | ICD-10-CM | POA: Diagnosis not present

## 2017-09-13 DIAGNOSIS — R2689 Other abnormalities of gait and mobility: Secondary | ICD-10-CM

## 2017-09-13 NOTE — Therapy (Signed)
[place] MAIN [facility] SERVICES [address] [place], [place], [address] Phone: [phone]   Fax:  [phone]  Physical Therapy Treatment  Patient Details  Name: Marissa Wilson MRN: [id number] Date of Birth: November 17, 1945 Referring Provider: Thomasene Mohair   Encounter Date: 09/13/2017  PT End of Session - 09/13/17 1321    Visit Number  17    Number of Visits  22    Date for PT Re-Evaluation  09/21/17    PT Start Time  1304    PT Stop Time  1345    PT Time Calculation (min)  41 min    Equipment Utilized During Treatment  Gait belt    Activity Tolerance  Patient tolerated treatment well    Behavior During Therapy  [facility] for tasks assessed/performed       Past Medical History:  Diagnosis Date  . Asthma   . Gastrointestinal disorder   . GERD (gastroesophageal reflux disease)   . Hyperlipidemia, unspecified   . Hypertension   . Hypothyroid   . Osteoarthritis   . Sleep apnea   . Thyroid disease   . UTI (urinary tract infection)     Past Surgical History:  Procedure Laterality Date  . ABDOMINAL HYSTERECTOMY    . PR ENDOVENOUS LASTER, 1ST VEIN Right 06/09/2017   Procedure: EVLA of Right GSV; Surgeon: Lauretta Grill, MD; Location: MAIN OR [facility]; Service: Vascular  . PR LIGATN LONG SAPHENOUS VEIN AT SEPH-FEM JUNC Right 06/09/2017   Procedure: LIGATION & DIVISION OF LONG SAPHENOUS VEIN AT SAPHENOFEMORAL JUNCTION, OR DISTAL INTERRUPTIONS; Surgeon: Lauretta Grill, MD; Location: MAIN OR [facility]; Service: Vascular  . PR PHLEB VEINS EXTREM TO 20 Right 06/09/2017   Procedure: STAB PHLEBECTOMY OF VARICOSE VEINS, 1 EXTREMITY: 10-20 STAB INCISIONS; Surgeon: Lauretta Grill, MD; Location: MAIN OR [facility]; Service: Vascular    There were no vitals filed for this visit.  Subjective Assessment - 09/13/17 1309    Subjective  Patient reports leg is feeling better. Exercised yesterday and occasionally get right lateral leg pain.  Rolling out lateral leg has improved pain.     Pertinent History  Patient had surgery on October 25th to remove veins in legs, tied saphenous leg off in upper thigh and removed deeper bulging veins. Pt. Stayed overnight at [facility] due to blood clot history. Went home the next day and on Sunday the 28th I had excruciating pain in R calf. Doppler performed on 29th and found a hematoma and placed in hospital for a week. Left hospital and went to [facility]. Had PT for walking for a week. Has a rollater to use in public but has started decreasing use in home. Right calf is wrapped and painful upon posterior calf  Muscular contraction. .    Limitations  Sitting;Lifting;Standing;Walking;House hold activities    How long can you sit comfortably?  30 minutes    How long can you stand comfortably?  10 minutes    How long can you walk comfortably?  when standing    Diagnostic tests  Doppler    Patient Stated Goals  Decrease pain and swelling and improve quality of walking.     Currently in Pain?  Yes    Pain Score  2     Pain Location  Calf    Pain Orientation  Posterior    Pain Descriptors / Indicators  Aching    Pain Type  Acute pain    Pain Onset  More than a month  ago    Pain Frequency  Constant         Resisted walking; #12 forward 3x 7.5 sideways each way 3x , backwards 7.5 3x   Stair stretch : achilles/calf 2x 30 seconds (BLE)  Airex pad 6" step toe taps 20x   Airex pad 6" step side toe taps 15x each leg      ultrasound 3.3 mHz, 2 cycles 50% had warmer optional, intensity 1.5w/cm.    Pt. response to medical necessity:  Patient will continue to benefit from skilled physical therapy to decrease pain, swelling, and improve mobility for increased QOL                       PT Education - 09/13/17 1321    Education provided  Yes    Education Details  functional strength and dynamic balance    Person(s) Educated  Patient    Methods  Explanation;Demonstration;Verbal cues     Comprehension  Verbalized understanding;Returned demonstration       PT Short Term Goals - 08/24/17 1357      PT SHORT TERM GOAL #1   Title  Patient will increase 10 meter walk test to >1.81ms as to improve gait speed for better community ambulation and to reduce fall risk.    Baseline  11/19: .77 m/s  12/13: 1.43 m/s    Time  2    Period  Weeks    Status  Achieved      PT SHORT TERM GOAL #2   Title  Patient will increase ABC scale score >60% to demonstrate better functional mobility and better confidence with ADLs.     Baseline  11/19: 53.1% 12/13: 76%    Time  2    Period  Weeks    Status  Achieved      PT SHORT TERM GOAL #3   Title  Patient will have circumfrential measurements of calves within 1 cm of each other for decreased swelling and pain relief    Baseline  R 32.5 L 28   R 28.5 L 24.5;  1/9: R 25, L 24.5     Time  2    Period  Weeks    Status  Partially Met        PT Long Term Goals - 08/24/17 1407      PT LONG TERM GOAL #1   Title  Patient will increase ABC scale score >80% to demonstrate better functional mobility and better confidence with ADLs.     Baseline  11/19:  53.1% 12/13: 76.25%    Time  4    Period  Weeks    Status  Partially Met      PT LONG TERM GOAL #2   Title  Patient will report a worst pain of 3/10 on VAS in  R calf to improve tolerance with ADLs and reduced symptoms with activities.     Baseline  11/19: 7/10 pain, 12/13: worst pain 5/10 1/9:  5/10 at night     Time  4    Period  Weeks    Status  Partially Met      PT LONG TERM GOAL #3   Title  Patient will increase lower extremity functional scale to >60/80 to demonstrate improved functional mobility and increased tolerance with ADLs.     Baseline  11/19: 40/80 12/13: 60/80    Time  4    Period  Weeks    Status  Achieved  PT LONG TERM GOAL #4   Title  Patient will ambulate with equal weight acceptance upon L and R LE's to demonstrate improved quality of movement    Baseline   decreased weight acceptance upon RLE>, lateral weight shift on R to lateral aspect of foot due to fear of activaiton of medial calf musculature.     Time  4    Period  Weeks    Status  Partially Met      PT LONG TERM GOAL #5   Title  Patient will ambulate 1644 ft with 6 minute walk test to meet age norm for return to previous level of function.     Baseline  11/21: 1080 ft 12/13: 1600 ft  1/9: 1764     Time  4    Period  Weeks    Status  Achieved            Plan - 09/13/17 1328    Clinical Impression Statement  Patient improving with functional balance and mobility with decreased episodes of LOB. Patient able to perform longer duration stretches to calves with no increase in pain. Pain continues to decrease with improved ambulatory capacity.  Patient will continue to benefit from skilled physical therapy to decrease pain, swelling, and improve mobility for increased QOL    Rehab Potential  Fair    Clinical Impairments Affecting Rehab Potential  hematoma, DVTs, GERD, thyroid    PT Frequency  2x / week    PT Duration  4 weeks    PT Treatment/Interventions  ADLs/Self Care Home Management;Electrical Stimulation;Cryotherapy;Biofeedback;Iontophoresis 73m/ml Dexamethasone;Moist Heat;Ultrasound;Parrafin;Therapeutic exercise;Therapeutic activities;Functional mobility training;Stair training;Gait training;DME Instruction;Balance training;Neuromuscular re-education;Patient/family education;Compression bandaging;Manual techniques;Passive range of motion;Dry needling;Energy conservation;Visual/perceptual remediation/compensation    PT Next Visit Plan  ultrasound, balance, recruiting medial calf musculature.     Consulted and Agree with Plan of Care  Patient       Patient will benefit from skilled therapeutic intervention in order to improve the following deficits and impairments:  Abnormal gait, Decreased activity tolerance, Decreased balance, Decreased endurance, Decreased coordination, Decreased  mobility, Decreased range of motion, Increased edema, Difficulty walking, Decreased strength, Impaired perceived functional ability, Impaired flexibility, Improper body mechanics, Postural dysfunction, Pain  Visit Diagnosis: Pain in right lower leg  Other abnormalities of gait and mobility     Problem List Patient Active Problem List   Diagnosis Date Noted  . Encounter for routine adult physical exam with abnormal findings 08/03/2017  . Mixed hyperlipidemia 08/03/2017  . Acute deep vein thrombosis (DVT) of proximal vein of lower extremity ([place] 06/21/2017  . Hematoma 06/21/2017  . Chronic sinusitis with recurrent bronchitis 02/03/2016  . Phlebitis and thrombophlebitis of femoral vein ([place] 01/26/2011  . 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

## 2017-09-15 ENCOUNTER — Ambulatory Visit: Payer: PPO

## 2017-09-15 DIAGNOSIS — R2689 Other abnormalities of gait and mobility: Secondary | ICD-10-CM

## 2017-09-15 DIAGNOSIS — M79661 Pain in right lower leg: Secondary | ICD-10-CM | POA: Diagnosis not present

## 2017-09-15 NOTE — Therapy (Signed)
[place] MAIN [facility] SERVICES [address] [place], [place], [address] Phone: [phone]   Fax:  [phone]  Physical Therapy Treatment  Patient Details  Name: Marissa Wilson MRN: [id number] Date of Birth: 1946-02-22 Referring Provider: Thomasene Mohair   Encounter Date: 09/15/2017  PT End of Session - 09/15/17 1305    Visit Number  18    Number of Visits  22    Date for PT Re-Evaluation  09/21/17    PT Start Time  1301    PT Stop Time  1345    PT Time Calculation (min)  44 min    Equipment Utilized During Treatment  Gait belt    Activity Tolerance  Patient tolerated treatment well    Behavior During Therapy  [facility] for tasks assessed/performed       Past Medical History:  Diagnosis Date  . Asthma   . Gastrointestinal disorder   . GERD (gastroesophageal reflux disease)   . Hyperlipidemia, unspecified   . Hypertension   . Hypothyroid   . Osteoarthritis   . Sleep apnea   . Thyroid disease   . UTI (urinary tract infection)     Past Surgical History:  Procedure Laterality Date  . ABDOMINAL HYSTERECTOMY    . PR ENDOVENOUS LASTER, 1ST VEIN Right 06/09/2017   Procedure: EVLA of Right GSV; Surgeon: Lauretta Grill, MD; Location: MAIN OR [facility]; Service: Vascular  . PR LIGATN LONG SAPHENOUS VEIN AT SEPH-FEM JUNC Right 06/09/2017   Procedure: LIGATION & DIVISION OF LONG SAPHENOUS VEIN AT SAPHENOFEMORAL JUNCTION, OR DISTAL INTERRUPTIONS; Surgeon: Lauretta Grill, MD; Location: MAIN OR [facility]; Service: Vascular  . PR PHLEB VEINS EXTREM TO 20 Right 06/09/2017   Procedure: STAB PHLEBECTOMY OF VARICOSE VEINS, 1 EXTREMITY: 10-20 STAB INCISIONS; Surgeon: Lauretta Grill, MD; Location: MAIN OR [facility]; Service: Vascular    There were no vitals filed for this visit.  Subjective Assessment - 09/15/17 1303    Subjective  Patient reports leg is feeling good. Is going to visit other sister this upcoming week. No complaints or  concerns at this time.     Pertinent History  Patient had surgery on October 25th to remove veins in legs, tied saphenous leg off in upper thigh and removed deeper bulging veins. Pt. Stayed overnight at [facility] due to blood clot history. Went home the next day and on Sunday the 28th I had excruciating pain in R calf. Doppler performed on 29th and found a hematoma and placed in hospital for a week. Left hospital and went to [facility]. Had PT for walking for a week. Has a rollater to use in public but has started decreasing use in home. Right calf is wrapped and painful upon posterior calf  Muscular contraction. .    Limitations  Sitting;Lifting;Standing;Walking;House hold activities    How long can you sit comfortably?  30 minutes    How long can you stand comfortably?  10 minutes    How long can you walk comfortably?  when standing    Diagnostic tests  Doppler    Patient Stated Goals  Decrease pain and swelling and improve quality of walking.     Currently in Pain?  Yes    Pain Score  2     Pain Location  Calf    Pain Orientation  Posterior    Pain Descriptors / Indicators  Aching    Pain Type  Acute pain    Pain Onset  More than a month  ago    Pain Frequency  Constant       Walk across unstable surface (red mat) with objects placed under it to create additional instabilities 6x no LOB, cues for maintaining upright posture for increased stability   Walk across unstable mat with objects under it squatting down to pick up and put down cones, cues for wider BOS during squat, no LOB 6x  SLS small nubby bosu toe touch with PT guiding which leg and which color to touch via verbal cues. 20x  Prostretch: achilles/calf 2x 30 seconds (BLE)  Star balance: tap foot clockwise to edge of star R and LLE, CGA   Eccentric heel taps 15x each leg BUE support on stair, cues for decreasing UE assistance to increase LE involvement.     Squeezing ball between ankles (adduction) with LAQ 10x , cues for upright  posture  sidelying adduction 5x each side  ultrasound 3.3 mHz, 2 cycles 50% had warmer optional, intensity 1.5w/cm.: 15 minutes    Pt. response to medical necessity:  Patient will continue to benefit from skilled physical therapy to decrease pain, swelling, and improve mobility for increased QOL                        PT Education - 09/15/17 1304    Education provided  Yes    Education Details  functional strength and dynamic balance    Person(s) Educated  Patient    Methods  Explanation;Demonstration;Verbal cues    Comprehension  Verbalized understanding;Returned demonstration       PT Short Term Goals - 08/24/17 1357      PT SHORT TERM GOAL #1   Title  Patient will increase 10 meter walk test to >1.25ms as to improve gait speed for better community ambulation and to reduce fall risk.    Baseline  11/19: .77 m/s  12/13: 1.43 m/s    Time  2    Period  Weeks    Status  Achieved      PT SHORT TERM GOAL #2   Title  Patient will increase ABC scale score >60% to demonstrate better functional mobility and better confidence with ADLs.     Baseline  11/19: 53.1% 12/13: 76%    Time  2    Period  Weeks    Status  Achieved      PT SHORT TERM GOAL #3   Title  Patient will have circumfrential measurements of calves within 1 cm of each other for decreased swelling and pain relief    Baseline  R 32.5 L 28   R 28.5 L 24.5;  1/9: R 25, L 24.5     Time  2    Period  Weeks    Status  Partially Met        PT Long Term Goals - 08/24/17 1407      PT LONG TERM GOAL #1   Title  Patient will increase ABC scale score >80% to demonstrate better functional mobility and better confidence with ADLs.     Baseline  11/19:  53.1% 12/13: 76.25%    Time  4    Period  Weeks    Status  Partially Met      PT LONG TERM GOAL #2   Title  Patient will report a worst pain of 3/10 on VAS in  R calf to improve tolerance with ADLs and reduced symptoms with activities.     Baseline   11/19: 7/10  pain, 12/13: worst pain 5/10 1/9:  5/10 at night     Time  4    Period  Weeks    Status  Partially Met      PT LONG TERM GOAL #3   Title  Patient will increase lower extremity functional scale to >60/80 to demonstrate improved functional mobility and increased tolerance with ADLs.     Baseline  11/19: 40/80 12/13: 60/80    Time  4    Period  Weeks    Status  Achieved      PT LONG TERM GOAL #4   Title  Patient will ambulate with equal weight acceptance upon L and R LE's to demonstrate improved quality of movement    Baseline  decreased weight acceptance upon RLE>, lateral weight shift on R to lateral aspect of foot due to fear of activaiton of medial calf musculature.     Time  4    Period  Weeks    Status  Partially Met      PT LONG TERM GOAL #5   Title  Patient will ambulate 1644 ft with 6 minute walk test to meet age norm for return to previous level of function.     Baseline  11/21: 1080 ft 12/13: 1600 ft  1/9: 1764     Time  4    Period  Weeks    Status  Achieved            Plan - 09/15/17 1328    Clinical Impression Statement  Patient challenged by single limb stance at this time, however is improving with ability to ambulate across stable and unstable surfaces. Patient progressing to higher level balance interventions with noted instability with prolonged single limb stance.  Patient will continue to benefit from skilled physical therapy to decrease pain, swelling, and improve mobility for increased QOL    Rehab Potential  Fair    Clinical Impairments Affecting Rehab Potential  hematoma, DVTs, GERD, thyroid    PT Frequency  2x / week    PT Duration  4 weeks    PT Treatment/Interventions  ADLs/Self Care Home Management;Electrical Stimulation;Cryotherapy;Biofeedback;Iontophoresis 73m/ml Dexamethasone;Moist Heat;Ultrasound;Parrafin;Therapeutic exercise;Therapeutic activities;Functional mobility training;Stair training;Gait training;DME Instruction;Balance  training;Neuromuscular re-education;Patient/family education;Compression bandaging;Manual techniques;Passive range of motion;Dry needling;Energy conservation;Visual/perceptual remediation/compensation    PT Next Visit Plan  ultrasound, balance, recruiting medial calf musculature.     Consulted and Agree with Plan of Care  Patient       Patient will benefit from skilled therapeutic intervention in order to improve the following deficits and impairments:  Abnormal gait, Decreased activity tolerance, Decreased balance, Decreased endurance, Decreased coordination, Decreased mobility, Decreased range of motion, Increased edema, Difficulty walking, Decreased strength, Impaired perceived functional ability, Impaired flexibility, Improper body mechanics, Postural dysfunction, Pain  Visit Diagnosis: Pain in right lower leg  Other abnormalities of gait and mobility     Problem List Patient Active Problem List   Diagnosis Date Noted  . Encounter for routine adult physical exam with abnormal findings 08/03/2017  . Mixed hyperlipidemia 08/03/2017  . Acute deep vein thrombosis (DVT) of proximal vein of lower extremity ([place] 06/21/2017  . Hematoma 06/21/2017  . Chronic sinusitis with recurrent bronchitis 02/03/2016  . Phlebitis and thrombophlebitis of femoral vein ([place] 01/26/2011  . 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

## 2017-09-19 ENCOUNTER — Ambulatory Visit: Payer: PPO | Attending: Vascular Surgery

## 2017-09-19 DIAGNOSIS — R2689 Other abnormalities of gait and mobility: Secondary | ICD-10-CM | POA: Insufficient documentation

## 2017-09-19 DIAGNOSIS — M79661 Pain in right lower leg: Secondary | ICD-10-CM | POA: Diagnosis not present

## 2017-09-19 NOTE — Therapy (Signed)
[place] MAIN [facility] SERVICES [address] [place], [place], [address] Phone: [phone]   Fax:  [phone]  Physical Therapy Treatment  Patient Details  Name: Marissa Wilson MRN: [id number] Date of Birth: 1946/02/18 Referring Provider: Thomasene Mohair   Encounter Date: 09/19/2017  PT End of Session - 09/19/17 2029    Visit Number  19    Number of Visits  30    Date for PT Re-Evaluation  10/17/17    PT Start Time  1430    PT Stop Time  1515    PT Time Calculation (min)  45 min    Equipment Utilized During Treatment  Gait belt    Activity Tolerance  Patient tolerated treatment well    Behavior During Therapy  [facility] for tasks assessed/performed       Past Medical History:  Diagnosis Date  . Asthma   . Gastrointestinal disorder   . GERD (gastroesophageal reflux disease)   . Hyperlipidemia, unspecified   . Hypertension   . Hypothyroid   . Osteoarthritis   . Sleep apnea   . Thyroid disease   . UTI (urinary tract infection)     Past Surgical History:  Procedure Laterality Date  . ABDOMINAL HYSTERECTOMY    . PR ENDOVENOUS LASTER, 1ST VEIN Right 06/09/2017   Procedure: EVLA of Right GSV; Surgeon: Lauretta Grill, MD; Location: MAIN OR [facility]; Service: Vascular  . PR LIGATN LONG SAPHENOUS VEIN AT SEPH-FEM JUNC Right 06/09/2017   Procedure: LIGATION & DIVISION OF LONG SAPHENOUS VEIN AT SAPHENOFEMORAL JUNCTION, OR DISTAL INTERRUPTIONS; Surgeon: Lauretta Grill, MD; Location: MAIN OR [facility]; Service: Vascular  . PR PHLEB VEINS EXTREM TO 20 Right 06/09/2017   Procedure: STAB PHLEBECTOMY OF VARICOSE VEINS, 1 EXTREMITY: 10-20 STAB INCISIONS; Surgeon: Lauretta Grill, MD; Location: MAIN OR [facility]; Service: Vascular    There were no vitals filed for this visit.  Subjective Assessment - 09/19/17 1443    Subjective  Patient went to sister's over the weekend. Is feeling better but continues to feel limited in balance and  with some increased pain in calf with activity.     Pertinent History  Patient had surgery on October 25th to remove veins in legs, tied saphenous leg off in upper thigh and removed deeper bulging veins. Pt. Stayed overnight at [facility] due to blood clot history. Went home the next day and on Sunday the 28th I had excruciating pain in R calf. Doppler performed on 29th and found a hematoma and placed in hospital for a week. Left hospital and went to [facility]. Had PT for walking for a week. Has a rollater to use in public but has started decreasing use in home. Right calf is wrapped and painful upon posterior calf  Muscular contraction. .    Limitations  Sitting;Lifting;Standing;Walking;House hold activities    How long can you sit comfortably?  30 minutes    How long can you stand comfortably?  10 minutes    How long can you walk comfortably?  when standing    Diagnostic tests  Doppler    Patient Stated Goals  Decrease pain and swelling and improve quality of walking.     Currently in Pain?  Yes    Pain Score  2     Pain Location  Calf    Pain Orientation  Posterior    Pain Descriptors / Indicators  Aching    Pain Type  Acute pain    Pain Onset  More than a month ago    Pain Frequency  Constant        VAS: 2/10, worst 4/10  [facility] PT Assessment - 09/19/17 0001      Standardized Balance Assessment   Standardized Balance Assessment  Berg Balance Test      Berg Balance Test   Sit to Stand  Able to stand without using hands and stabilize independently    Standing Unsupported  Able to stand safely 2 minutes    Sitting with Back Unsupported but Feet Supported on Floor or Stool  Able to sit safely and securely 2 minutes    Stand to Sit  Sits safely with minimal use of hands    Transfers  Able to transfer safely, minor use of hands    Standing Unsupported with Eyes Closed  Able to stand 10 seconds with supervision    Standing Ubsupported with Feet Together  Able to place feet together independently  and stand 1 minute safely    From Standing, Reach Forward with Outstretched Arm  Can reach confidently >25 cm (10")    From Standing Position, Pick up Object from Floor  Able to pick up shoe safely and easily    From Standing Position, Turn to Look Behind Over each Shoulder  Looks behind from both sides and weight shifts well    Turn 360 Degrees  Able to turn 360 degrees safely in 4 seconds or less    Standing Unsupported, Alternately Place Feet on Step/Stool  Able to stand independently and safely and complete 8 steps in 20 seconds    Standing Unsupported, One Foot in Front  Able to place foot tandem independently and hold 30 seconds    Standing on One Leg  Able to lift leg independently and hold equal to or more than 3 seconds    Total Score  53       Circumferential: R 24.75 L 24.5 ABC= 79.37% Ambulate with weight acceptance  6 minute walk test : 1701 ft  BERG = 53/56    SLS : L 6 seconds R 4 seconds   ultrasound 3.3 mHz, 2 cycles 50% had warmer optional, intensity 1.5w/cm.: 15 minutes  Pt. response to medical necessity: Patient will continue to benefit from skilled physical therapy to decrease pain, swelling, and improve mobility for increased QOL              PT Education - 09/19/17 2028    Education provided  Yes    Education Details  POC, continuation of care, balance progression    Person(s) Educated  Patient    Methods  Explanation;Demonstration;Verbal cues    Comprehension  Verbalized understanding;Returned demonstration;Verbal cues required       PT Short Term Goals - 09/19/17 1448      PT SHORT TERM GOAL #1   Title  Patient will increase 10 meter walk test to >1.42ms as to improve gait speed for better community ambulation and to reduce fall risk.    Baseline  11/19: .77 m/s  12/13: 1.43 m/s    Time  2    Period  Weeks    Status  Achieved      PT SHORT TERM GOAL #2   Title  Patient will increase ABC scale score >60% to demonstrate better  functional mobility and better confidence with ADLs.     Baseline  11/19: 53.1% 12/13: 76%    Time  2    Period  Weeks    Status  Achieved      PT SHORT TERM GOAL #3   Title  Patient will have circumfrential measurements of calves within 1 cm of each other for decreased swelling and pain relief    Baseline  R 32.5 L 28   R 28.5 L 24.5;  1/9: R 25, L 24.5 ; 2/4: R 24.75, L 24.5    Time  2    Period  Weeks    Status  Achieved        PT Long Term Goals - 09/19/17 1444      PT LONG TERM GOAL #1   Title  Patient will increase ABC scale score >80% to demonstrate better functional mobility and better confidence with ADLs.     Baseline  11/19:  53.1% 12/13: 76.25% 2/4: 79.37%    Time  4    Period  Weeks    Status  Partially Met    Target Date  10/17/17      PT LONG TERM GOAL #2   Title  Patient will report a worst pain of 3/10 on VAS in  R calf to improve tolerance with ADLs and reduced symptoms with activities.     Baseline  11/19: 7/10 pain, 12/13: worst pain 5/10 1/9:  5/10 at night 2/4: 4/10     Time  4    Period  Weeks    Status  Partially Met    Target Date  10/17/17      PT LONG TERM GOAL #3   Title  Patient will increase lower extremity functional scale to >60/80 to demonstrate improved functional mobility and increased tolerance with ADLs.     Baseline  11/19: 40/80 12/13: 60/80    Time  4    Period  Weeks    Status  Achieved      PT LONG TERM GOAL #4   Title  Patient will ambulate with equal weight acceptance upon L and R LE's to demonstrate improved quality of movement    Baseline  equal weight acceptance with decreased stability upon R ankle     Time  4    Period  Weeks    Status  Achieved      PT LONG TERM GOAL #5   Title  Patient will ambulate 1644 ft with 6 minute walk test to meet age norm for return to previous level of function.     Baseline  11/21: 1080 ft 12/13: 1600 ft  1/9: 1764     Time  4    Period  Weeks    Status  Achieved      Additional Long  Term Goals   Additional Long Term Goals  Yes      PT LONG TERM GOAL #6   Title  Patient will increase RLE single limb stance to >/= 10 seconds to improve patient's stability during dynamic mobility.     Baseline  4 seconds    Time  4    Period  Weeks    Status  New    Target Date  10/17/17              Patient will benefit from skilled therapeutic intervention in order to improve the following deficits and impairments:     Visit Diagnosis: Pain in right lower leg  Other abnormalities of gait and mobility     Problem List Patient Active Problem List   Diagnosis Date Noted  . Encounter for routine adult physical exam with abnormal findings 08/03/2017  .  Mixed hyperlipidemia 08/03/2017  . Acute deep vein thrombosis (DVT) of proximal vein of lower extremity ([place]) 06/21/2017  . Hematoma 06/21/2017  . Chronic sinusitis with recurrent bronchitis 02/03/2016  . Phlebitis and thrombophlebitis of femoral vein ([place]) 01/26/2011  . 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 [place], [place], [address] Phone: [phone]   Fax:  [phone]  Name: Marissa Wilson MRN: [id number] Date of Birth: 1945/11/22

## 2017-09-20 ENCOUNTER — Ambulatory Visit: Payer: PPO

## 2017-09-21 ENCOUNTER — Ambulatory Visit: Payer: PPO

## 2017-09-21 DIAGNOSIS — R2689 Other abnormalities of gait and mobility: Secondary | ICD-10-CM

## 2017-09-21 DIAGNOSIS — M79661 Pain in right lower leg: Secondary | ICD-10-CM | POA: Diagnosis not present

## 2017-09-21 NOTE — Therapy (Signed)
[place] MAIN [facility] SERVICES [address] [place], [place], [address] Phone: [phone]   Fax:  [phone]  Physical Therapy Treatment  Patient Details  Name: Marissa Wilson MRN: [id number] Date of Birth: 1946-07-01 Referring Provider: Thomasene Mohair   Encounter Date: 09/21/2017  PT End of Session - 09/21/17 1522    Visit Number  20    Number of Visits  30    Date for PT Re-Evaluation  10/17/17    PT Start Time  9390    PT Stop Time  1600    PT Time Calculation (min)  44 min    Equipment Utilized During Treatment  Gait belt    Activity Tolerance  Patient tolerated treatment well    Behavior During Therapy  [facility] for tasks assessed/performed       Past Medical History:  Diagnosis Date  . Asthma   . Gastrointestinal disorder   . GERD (gastroesophageal reflux disease)   . Hyperlipidemia, unspecified   . Hypertension   . Hypothyroid   . Osteoarthritis   . Sleep apnea   . Thyroid disease   . UTI (urinary tract infection)     Past Surgical History:  Procedure Laterality Date  . ABDOMINAL HYSTERECTOMY    . PR ENDOVENOUS LASTER, 1ST VEIN Right 06/09/2017   Procedure: EVLA of Right GSV; Surgeon: Lauretta Grill, MD; Location: MAIN OR [facility]; Service: Vascular  . PR LIGATN LONG SAPHENOUS VEIN AT SEPH-FEM JUNC Right 06/09/2017   Procedure: LIGATION & DIVISION OF LONG SAPHENOUS VEIN AT SAPHENOFEMORAL JUNCTION, OR DISTAL INTERRUPTIONS; Surgeon: Lauretta Grill, MD; Location: MAIN OR [facility]; Service: Vascular  . PR PHLEB VEINS EXTREM TO 20 Right 06/09/2017   Procedure: STAB PHLEBECTOMY OF VARICOSE VEINS, 1 EXTREMITY: 10-20 STAB INCISIONS; Surgeon: Lauretta Grill, MD; Location: MAIN OR [facility]; Service: Vascular    There were no vitals filed for this visit.  Subjective Assessment - 09/21/17 1519    Subjective  Patient went to gym before this session, is having decreased calf pain since last session.     Pertinent  History  Patient had surgery on October 25th to remove veins in legs, tied saphenous leg off in upper thigh and removed deeper bulging veins. Pt. Stayed overnight at [facility] due to blood clot history. Went home the next day and on Sunday the 28th I had excruciating pain in R calf. Doppler performed on 29th and found a hematoma and placed in hospital for a week. Left hospital and went to [facility]. Had PT for walking for a week. Has a rollater to use in public but has started decreasing use in home. Right calf is wrapped and painful upon posterior calf  Muscular contraction. .    Limitations  Sitting;Lifting;Standing;Walking;House hold activities    How long can you sit comfortably?  30 minutes    How long can you stand comfortably?  10 minutes    How long can you walk comfortably?  when standing    Diagnostic tests  Doppler    Patient Stated Goals  Decrease pain and swelling and improve quality of walking.     Currently in Pain?  No/denies       Incline treadmill walk: 2.0 incline 3% Tandem stance ball toss 20x in // bars (performed BLE so 40 throws total) , occasional lateral LOB  Airex pad balloon pass 60x no LOB Crane step ups on 6" step 15x each leg to encourage SLS  Dorsiflexion stretch on half foam  roller in // bars 2x 30 seconds.   Airex pad: reach to put balls on different levels of lateral tree 5 minutes:    ultrasound 3.3 mHz, 2 cycles 50% had warmer optional, intensity 1.5w/cm.: 15 minutes    Pt. response to medical necessity:  Patient will continue to benefit from skilled physical therapy to decrease pain, swelling, and improve mobility for increased QOL                         PT Education - 09/21/17 1521    Education provided  Yes    Education Details  balance progression,     Person(s) Educated  Patient    Methods  Explanation;Demonstration;Verbal cues    Comprehension  Verbalized understanding;Returned demonstration;Verbal cues required       PT  Short Term Goals - 09/19/17 1448      PT SHORT TERM GOAL #1   Title  Patient will increase 10 meter walk test to >1.75ms as to improve gait speed for better community ambulation and to reduce fall risk.    Baseline  11/19: .77 m/s  12/13: 1.43 m/s    Time  2    Period  Weeks    Status  Achieved      PT SHORT TERM GOAL #2   Title  Patient will increase ABC scale score >60% to demonstrate better functional mobility and better confidence with ADLs.     Baseline  11/19: 53.1% 12/13: 76%    Time  2    Period  Weeks    Status  Achieved      PT SHORT TERM GOAL #3   Title  Patient will have circumfrential measurements of calves within 1 cm of each other for decreased swelling and pain relief    Baseline  R 32.5 L 28   R 28.5 L 24.5;  1/9: R 25, L 24.5 ; 2/4: R 24.75, L 24.5    Time  2    Period  Weeks    Status  Achieved        PT Long Term Goals - 09/19/17 1444      PT LONG TERM GOAL #1   Title  Patient will increase ABC scale score >80% to demonstrate better functional mobility and better confidence with ADLs.     Baseline  11/19:  53.1% 12/13: 76.25% 2/4: 79.37%    Time  4    Period  Weeks    Status  Partially Met    Target Date  10/17/17      PT LONG TERM GOAL #2   Title  Patient will report a worst pain of 3/10 on VAS in  R calf to improve tolerance with ADLs and reduced symptoms with activities.     Baseline  11/19: 7/10 pain, 12/13: worst pain 5/10 1/9:  5/10 at night 2/4: 4/10     Time  4    Period  Weeks    Status  Partially Met    Target Date  10/17/17      PT LONG TERM GOAL #3   Title  Patient will increase lower extremity functional scale to >60/80 to demonstrate improved functional mobility and increased tolerance with ADLs.     Baseline  11/19: 40/80 12/13: 60/80    Time  4    Period  Weeks    Status  Achieved      PT LONG TERM GOAL #4   Title  Patient will  ambulate with equal weight acceptance upon L and R LE's to demonstrate improved quality of movement     Baseline  equal weight acceptance with decreased stability upon R ankle     Time  4    Period  Weeks    Status  Achieved      PT LONG TERM GOAL #5   Title  Patient will ambulate 1644 ft with 6 minute walk test to meet age norm for return to previous level of function.     Baseline  11/21: 1080 ft 12/13: 1600 ft  1/9: 1764     Time  4    Period  Weeks    Status  Achieved      Additional Long Term Goals   Additional Long Term Goals  Yes      PT LONG TERM GOAL #6   Title  Patient will increase RLE single limb stance to >/= 10 seconds to improve patient's stability during dynamic mobility.     Baseline  4 seconds    Time  4    Period  Weeks    Status  New    Target Date  10/17/17            Plan - 09/21/17 1535    Clinical Impression Statement  Patient is challenged by single limb stance due to poor stability in bilateral ankles and LE"s. Stability on unstable surfaces improving with decreased LOB, Tandem stance challenging to patient with frequent LOB.  Patient will continue to benefit from skilled physical therapy to decrease pain, swelling, and improve mobility for increased QOL    Rehab Potential  Fair    Clinical Impairments Affecting Rehab Potential  hematoma, DVTs, GERD, thyroid    PT Frequency  2x / week    PT Duration  4 weeks    PT Treatment/Interventions  ADLs/Self Care Home Management;Electrical Stimulation;Cryotherapy;Biofeedback;Iontophoresis 54m/ml Dexamethasone;Moist Heat;Ultrasound;Parrafin;Therapeutic exercise;Therapeutic activities;Functional mobility training;Stair training;Gait training;DME Instruction;Balance training;Neuromuscular re-education;Patient/family education;Compression bandaging;Manual techniques;Passive range of motion;Dry needling;Energy conservation;Visual/perceptual remediation/compensation    PT Next Visit Plan  ultrasound, balance, recruiting medial calf musculature.     Consulted and Agree with Plan of Care  Patient       Patient will  benefit from skilled therapeutic intervention in order to improve the following deficits and impairments:  Abnormal gait, Decreased activity tolerance, Decreased balance, Decreased endurance, Decreased coordination, Decreased mobility, Decreased range of motion, Increased edema, Difficulty walking, Decreased strength, Impaired perceived functional ability, Impaired flexibility, Improper body mechanics, Postural dysfunction, Pain  Visit Diagnosis: Pain in right lower leg  Other abnormalities of gait and mobility     Problem List Patient Active Problem List   Diagnosis Date Noted  . Encounter for routine adult physical exam with abnormal findings 08/03/2017  . Mixed hyperlipidemia 08/03/2017  . Acute deep vein thrombosis (DVT) of [place] 06/21/2017  . Hematoma 06/21/2017  . Chronic sinusitis with recurrent bronchitis 02/03/2016  . Phle[place] 01/26[place]  . 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

## 2017-09-26 ENCOUNTER — Ambulatory Visit: Payer: PPO

## 2017-09-27 ENCOUNTER — Other Ambulatory Visit: Payer: Self-pay | Admitting: Internal Medicine

## 2017-09-28 ENCOUNTER — Ambulatory Visit: Payer: PPO

## 2017-09-28 DIAGNOSIS — M79661 Pain in right lower leg: Secondary | ICD-10-CM | POA: Diagnosis not present

## 2017-09-28 DIAGNOSIS — R2689 Other abnormalities of gait and mobility: Secondary | ICD-10-CM

## 2017-09-28 NOTE — Therapy (Signed)
[place] MAIN [facility] SERVICES [address] [place], [place], [address] Phone: [phone]   Fax:  [phone]  Physical Therapy Treatment  Patient Details  Name: Marissa Wilson MRN: [id number] Date of Birth: Mar 26, 1946 Referring Provider: Thomasene Mohair   Encounter Date: 09/28/2017  PT End of Session - 09/28/17 1611    Visit Number  21    Number of Visits  30    Date for PT Re-Evaluation  10/17/17    PT Start Time  1540    PT Stop Time  1618    PT Time Calculation (min)  38 min    Equipment Utilized During Treatment  Gait belt    Activity Tolerance  Patient tolerated treatment well;No increased pain    Behavior During Therapy  WFL for tasks assessed/performed       Past Medical History:  Diagnosis Date  . Asthma   . Gastrointestinal disorder   . GERD (gastroesophageal reflux disease)   . Hyperlipidemia, unspecified   . Hypertension   . Hypothyroid   . Osteoarthritis   . Sleep apnea   . Thyroid disease   . UTI (urinary tract infection)     Past Surgical History:  Procedure Laterality Date  . ABDOMINAL HYSTERECTOMY    . PR ENDOVENOUS LASTER, 1ST VEIN Right 06/09/2017   Procedure: EVLA of Right GSV; Surgeon: Lauretta Grill, MD; Location: MAIN OR [facility]; Service: Vascular  . PR LIGATN LONG SAPHENOUS VEIN AT SEPH-FEM JUNC Right 06/09/2017   Procedure: LIGATION & DIVISION OF LONG SAPHENOUS VEIN AT SAPHENOFEMORAL JUNCTION, OR DISTAL INTERRUPTIONS; Surgeon: Lauretta Grill, MD; Location: MAIN OR [facility]; Service: Vascular  . PR PHLEB VEINS EXTREM TO 20 Right 06/09/2017   Procedure: STAB PHLEBECTOMY OF VARICOSE VEINS, 1 EXTREMITY: 10-20 STAB INCISIONS; Surgeon: Lauretta Grill, MD; Location: MAIN OR [facility]; Service: Vascular    There were no vitals filed for this visit.  Subjective Assessment - 09/28/17 1442    Subjective  Patient reports to PT eager to begin. Reports less pain.    Pertinent History  Patient  had surgery on October 25th to remove veins in legs, tied saphenous leg off in upper thigh and removed deeper bulging veins. Pt. Stayed overnight at [facility] due to blood clot history. Went home the next day and on Sunday the 28th I had excruciating pain in R calf. Doppler performed on 29th and found a hematoma and placed in hospital for a week. Left hospital and went to [facility]. Had PT for walking for a week. Has a rollater to use in public but has started decreasing use in home. Right calf is wrapped and painful upon posterior calf  Muscular contraction. .    Limitations  Sitting;Lifting;Standing;Walking;House hold activities    How long can you sit comfortably?  30 minutes    How long can you stand comfortably?  10 minutes    How long can you walk comfortably?  when standing    Diagnostic tests  Doppler    Patient Stated Goals  Decrease pain and swelling and improve quality of walking.     Currently in Pain?  Yes    Pain Score  1     Pain Location  Calf    Pain Orientation  Posterior    Pain Descriptors / Indicators  Aching    Pain Type  Acute pain    Pain Onset  More than a month ago    Pain Frequency  Constant  Multiple Pain Sites  No       TREATMENT  Parallel bars:  BLE step-up to crane stance 4" step x 10; cues to bring knee up BLE eccentric heel taps 2" x 15; min cues for correct exercise technique, knee alignment (knee drifts medially); patient challenged to maintain alignment without UE support  Airex pad: BUE squat down to retrieve cones, place on table x 15; patient shows consistent stability with squatting and reaching Medicine ball 1# tosses on Airex foam x 20; no LOB with tosses off midline  BLE SLS: Medicine ball 1# horizontal adduction/abduction @ 90 deg. flexion with extended UE 2 x 5 each. Patient demonstrates mild instability requiring CGA.    Foam balance beam tandem walk fingertip UE assist, cues to perform slowly   Inverted  foam: Toe-to-heel rocks x 5 no  LOB, required CGA for safety  Ultrasound to LLE medial calf: 15 minutes 3.3 mHz 2 cycles 50%  intensity 1.5w/cm, warm head     PT Education - 09/28/17 1610    Education provided  Yes    Education Details  balance progression    Person(s) Educated  Patient    Methods  Explanation;Demonstration;Verbal cues    Comprehension  Verbalized understanding;Verbal cues required;Returned demonstration       PT Short Term Goals - 09/19/17 1448      PT SHORT TERM GOAL #1   Title  Patient will increase 10 meter walk test to >1.88ms as to improve gait speed for better community ambulation and to reduce fall risk.    Baseline  11/19: .77 m/s  12/13: 1.43 m/s    Time  2    Period  Weeks    Status  Achieved      PT SHORT TERM GOAL #2   Title  Patient will increase ABC scale score >60% to demonstrate better functional mobility and better confidence with ADLs.     Baseline  11/19: 53.1% 12/13: 76%    Time  2    Period  Weeks    Status  Achieved      PT SHORT TERM GOAL #3   Title  Patient will have circumfrential measurements of calves within 1 cm of each other for decreased swelling and pain relief    Baseline  R 32.5 L 28   R 28.5 L 24.5;  1/9: R 25, L 24.5 ; 2/4: R 24.75, L 24.5    Time  2    Period  Weeks    Status  Achieved        PT Long Term Goals - 09/19/17 1444      PT LONG TERM GOAL #1   Title  Patient will increase ABC scale score >80% to demonstrate better functional mobility and better confidence with ADLs.     Baseline  11/19:  53.1% 12/13: 76.25% 2/4: 79.37%    Time  4    Period  Weeks    Status  Partially Met    Target Date  10/17/17      PT LONG TERM GOAL #2   Title  Patient will report a worst pain of 3/10 on VAS in  R calf to improve tolerance with ADLs and reduced symptoms with activities.     Baseline  11/19: 7/10 pain, 12/13: worst pain 5/10 1/9:  5/10 at night 2/4: 4/10     Time  4    Period  Weeks    Status  Partially Met    Target Date  10/17/17       PT LONG TERM GOAL #3   Title  Patient will increase lower extremity functional scale to >60/80 to demonstrate improved functional mobility and increased tolerance with ADLs.     Baseline  11/19: 40/80 12/13: 60/80    Time  4    Period  Weeks    Status  Achieved      PT LONG TERM GOAL #4   Title  Patient will ambulate with equal weight acceptance upon L and R LE's to demonstrate improved quality of movement    Baseline  equal weight acceptance with decreased stability upon R ankle     Time  4    Period  Weeks    Status  Achieved      PT LONG TERM GOAL #5   Title  Patient will ambulate 1644 ft with 6 minute walk test to meet age norm for return to previous level of function.     Baseline  11/21: 1080 ft 12/13: 1600 ft  1/9: 1764     Time  4    Period  Weeks    Status  Achieved      Additional Long Term Goals   Additional Long Term Goals  Yes      PT LONG TERM GOAL #6   Title  Patient will increase RLE single limb stance to >/= 10 seconds to improve patient's stability during dynamic mobility.     Baseline  4 seconds    Time  4    Period  Weeks    Status  New    Target Date  10/17/17       Plan - 09/28/17 1622    Clinical Impression Statement  Patient demonstrates improvements with dynamic balance exercises in standing; requires CGA or fingertip UE support with challenging SLS exercises partly due to mild hip weakness. Demonstrates improved ankle strength and functional capacity and reduced swelling and bruising on posterior calf. Patient will continue to benefit from skilled physical therapy to decrease pain, swelling, and improve mobility for increased QOL.    Rehab Potential  Fair    Clinical Impairments Affecting Rehab Potential  hematoma, DVTs, GERD, thyroid    PT Frequency  2x / week    PT Duration  4 weeks    PT Treatment/Interventions  ADLs/Self Care Home Management;Electrical Stimulation;Cryotherapy;Biofeedback;Iontophoresis 3m/ml Dexamethasone;Moist  Heat;Ultrasound;Parrafin;Therapeutic exercise;Therapeutic activities;Functional mobility training;Stair training;Gait training;DME Instruction;Balance training;Neuromuscular re-education;Patient/family education;Compression bandaging;Manual techniques;Passive range of motion;Dry needling;Energy conservation;Visual/perceptual remediation/compensation    PT Next Visit Plan  ultrasound, balance, recruiting medial calf musculature.     Consulted and Agree with Plan of Care  Patient       Patient will benefit from skilled therapeutic intervention in order to improve the following deficits and impairments:  Abnormal gait, Decreased activity tolerance, Decreased balance, Decreased endurance, Decreased coordination, Decreased mobility, Decreased range of motion, Increased edema, Difficulty walking, Decreased strength, Impaired perceived functional ability, Impaired flexibility, Improper body mechanics, Postural dysfunction, Pain  Visit Diagnosis: Pain in right lower leg  Other abnormalities of gait and mobility     Problem List Patient Active Problem List   Diagnosis Date Noted  . Encounter for routine adult physical exam with abnormal findings 08/03/2017  . Mixed hyperlipidemia 08/03/2017  . Acute deep vein thrombosis (DVT) of [place] vein of [place] 06/21/2017  . Hematoma 06/21/2017  . Chronic sinusitis with recurrent bronchitis 02/03/2016  . Phlebitis and thrombophlebitis of femoral vein ([place] 01/26/2011  . 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 I have personally read, edited and approve of the note as written. Janna Arch, PT, DPT   [place] MAIN [facility] SERVICES [address] [place], [place], [address] Phone: [phone]   Fax:   [phone]  Name: Marissa Wilson MRN: [id number] Date of Birth: 04/10/1946

## 2017-10-03 ENCOUNTER — Ambulatory Visit: Payer: PPO

## 2017-10-03 DIAGNOSIS — R2689 Other abnormalities of gait and mobility: Secondary | ICD-10-CM

## 2017-10-03 DIAGNOSIS — M79661 Pain in right lower leg: Secondary | ICD-10-CM

## 2017-10-03 NOTE — Therapy (Signed)
[place] MAIN [facility] SERVICES [address] [place], [place], [address] Phone: [phone]   Fax:  [phone]  Physical Therapy Treatment  Patient Details  Name: Marissa Wilson MRN: [id number] Date of Birth: 1945-12-28 Referring Provider: Thomasene Mohair   Encounter Date: 10/03/2017  PT End of Session - 10/03/17 1713    Visit Number  22    Number of Visits  30    Date for PT Re-Evaluation  10/17/17    PT Start Time  3419    PT Stop Time  1346    PT Time Calculation (min)  43 min    Equipment Utilized During Treatment  Gait belt    Activity Tolerance  Patient tolerated treatment well;No increased pain    Behavior During Therapy  WFL for tasks assessed/performed       Past Medical History:  Diagnosis Date  . Asthma   . Gastrointestinal disorder   . GERD (gastroesophageal reflux disease)   . Hyperlipidemia, unspecified   . Hypertension   . Hypothyroid   . Osteoarthritis   . Sleep apnea   . Thyroid disease   . UTI (urinary tract infection)     Past Surgical History:  Procedure Laterality Date  . ABDOMINAL HYSTERECTOMY    . PR ENDOVENOUS LASTER, 1ST VEIN Right 06/09/2017   Procedure: EVLA of Right GSV; Surgeon: Lauretta Grill, MD; Location: MAIN OR [facility]; Service: Vascular  . PR LIGATN LONG SAPHENOUS VEIN AT SEPH-FEM JUNC Right 06/09/2017   Procedure: LIGATION & DIVISION OF LONG SAPHENOUS VEIN AT SAPHENOFEMORAL JUNCTION, OR DISTAL INTERRUPTIONS; Surgeon: Lauretta Grill, MD; Location: MAIN OR [facility]; Service: Vascular  . PR PHLEB VEINS EXTREM TO 20 Right 06/09/2017   Procedure: STAB PHLEBECTOMY OF VARICOSE VEINS, 1 EXTREMITY: 10-20 STAB INCISIONS; Surgeon: Lauretta Grill, MD; Location: MAIN OR [facility]; Service: Vascular    There were no vitals filed for this visit.  Subjective Assessment - 10/03/17 1305    Subjective  Patient reports to PT wearing flats (left tennis shoes in car). Reports that pain is  diminishing.    Pertinent History  Patient had surgery on October 25th to remove veins in legs, tied saphenous leg off in upper thigh and removed deeper bulging veins. Pt. Stayed overnight at [facility] due to blood clot history. Went home the next day and on Sunday the 28th I had excruciating pain in R calf. Doppler performed on 29th and found a hematoma and placed in hospital for a week. Left hospital and went to [facility]. Had PT for walking for a week. Has a rollater to use in public but has started decreasing use in home. Right calf is wrapped and painful upon posterior calf  Muscular contraction. .    Limitations  Sitting;Lifting;Standing;Walking;House hold activities    How long can you sit comfortably?  30 minutes    How long can you stand comfortably?  10 minutes    How long can you walk comfortably?  when standing    Diagnostic tests  Doppler    Patient Stated Goals  Decrease pain and swelling and improve quality of walking.     Currently in Pain?  Yes    Pain Score  2     Pain Location  Leg    Pain Orientation  Posterior    Pain Descriptors / Indicators  Aching    Pain Type  Acute pain    Pain Radiating Towards  up the leg    Pain Onset  More than a month ago    Pain Frequency  Intermittent    Aggravating Factors   extensive PT, stretching, standing    Pain Relieving Factors  elevating leg    Multiple Pain Sites  No        TREATMENT  Warm-up: Treadmill: 2.0 mph 3% incline x 5 min (unbilled)  Parallel bars: BLE eccentric heel taps x 10; patient provided with target for better exercise performance. Patient demonstrated improved knee alignment with min cueing.  Airex foam:  SLS tapping 4 targets with contralateral foot over BLE x 5 rounds; patient  SLS with contralateral hip abduction over BLE x 10; cues for correct exercise technique (maintaining trunk stability) and slowing exercise; no LOB SLS with contralateral hip flexion/extension over BLE x 10; patient required fingertip UE  assist to maintain balance. Cues for correct exercise technique (maintaining trunk stability)  RDL (russian dead lift) cone touches x 10 over BLE; patient challenged by exercise requiring CGA BOSU blue-side step-ups x 15 over BLE; patient required fingertip UE assist and CGA, BOSU blue-side squats x 10    Balance board: Toe-heel rocks x 5; patient required min cueing for better positioning over balance board    Ultrasound to LLE medial calf: 15 minutes total 10 minutes with head on 3.3 mHz 2 cycles 50%  intensity 1.5w/cm, warm head  Patient tolerated treatment well reporting no increase in pain. Reduced bruising evident at surgical site.  Patient response to medical necessity: Patient will continue to benefit from skilled physical therapy for improved strength, ROM, and functional mobility.      PT Education - 10/04/17 0841    Education provided  Yes    Education Details  balance progression and strengthening for pain relief and return to PLOF     Person(s) Educated  Patient    Methods  Explanation;Demonstration;Verbal cues    Comprehension  Verbalized understanding;Returned demonstration       PT Short Term Goals - 09/19/17 1448      PT SHORT TERM GOAL #1   Title  Patient will increase 10 meter walk test to >1.95ms as to improve gait speed for better community ambulation and to reduce fall risk.    Baseline  11/19: .77 m/s  12/13: 1.43 m/s    Time  2    Period  Weeks    Status  Achieved      PT SHORT TERM GOAL #2   Title  Patient will increase ABC scale score >60% to demonstrate better functional mobility and better confidence with ADLs.     Baseline  11/19: 53.1% 12/13: 76%    Time  2    Period  Weeks    Status  Achieved      PT SHORT TERM GOAL #3   Title  Patient will have circumfrential measurements of calves within 1 cm of each other for decreased swelling and pain relief    Baseline  R 32.5 L 28   R 28.5 L 24.5;  1/9: R 25, L 24.5 ; 2/4: R 24.75, L 24.5    Time   2    Period  Weeks    Status  Achieved        PT Long Term Goals - 09/19/17 1444      PT LONG TERM GOAL #1   Title  Patient will increase ABC scale score >80% to demonstrate better functional mobility and better confidence with ADLs.     Baseline  11/19:  53.1% 12/13: 76.25%  2/4: 79.37%    Time  4    Period  Weeks    Status  Partially Met    Target Date  10/17/17      PT LONG TERM GOAL #2   Title  Patient will report a worst pain of 3/10 on VAS in  R calf to improve tolerance with ADLs and reduced symptoms with activities.     Baseline  11/19: 7/10 pain, 12/13: worst pain 5/10 1/9:  5/10 at night 2/4: 4/10     Time  4    Period  Weeks    Status  Partially Met    Target Date  10/17/17      PT LONG TERM GOAL #3   Title  Patient will increase lower extremity functional scale to >60/80 to demonstrate improved functional mobility and increased tolerance with ADLs.     Baseline  11/19: 40/80 12/13: 60/80    Time  4    Period  Weeks    Status  Achieved      PT LONG TERM GOAL #4   Title  Patient will ambulate with equal weight acceptance upon L and R LE's to demonstrate improved quality of movement    Baseline  equal weight acceptance with decreased stability upon R ankle     Time  4    Period  Weeks    Status  Achieved      PT LONG TERM GOAL #5   Title  Patient will ambulate 1644 ft with 6 minute walk test to meet age norm for return to previous level of function.     Baseline  11/21: 1080 ft 12/13: 1600 ft  1/9: 1764     Time  4    Period  Weeks    Status  Achieved      Additional Long Term Goals   Additional Long Term Goals  Yes      PT LONG TERM GOAL #6   Title  Patient will increase RLE single limb stance to >/= 10 seconds to improve patient's stability during dynamic mobility.     Baseline  4 seconds    Time  4    Period  Weeks    Status  New    Target Date  10/17/17            Plan - 10/03/17 1716    Clinical Impression Statement  Patient progressing  with advanced SLS exercises; demonstrates improved hip strength and ankle strength R > L. Patient shows only mild bruising on posterior calf. Patient will continue to benefit from skilled physical therapy to decrease pain, swelling, and improve mobility for increased QOL.    Rehab Potential  Fair    Clinical Impairments Affecting Rehab Potential  hematoma, DVTs, GERD, thyroid    PT Frequency  2x / week    PT Duration  4 weeks    PT Treatment/Interventions  ADLs/Self Care Home Management;Electrical Stimulation;Cryotherapy;Biofeedback;Iontophoresis 79m/ml Dexamethasone;Moist Heat;Ultrasound;Parrafin;Therapeutic exercise;Therapeutic activities;Functional mobility training;Stair training;Gait training;DME Instruction;Balance training;Neuromuscular re-education;Patient/family education;Compression bandaging;Manual techniques;Passive range of motion;Dry needling;Energy conservation;Visual/perceptual remediation/compensation    PT Next Visit Plan  ultrasound, balance, recruiting medial calf musculature.     Consulted and Agree with Plan of Care  Patient       Patient will benefit from skilled therapeutic intervention in order to improve the following deficits and impairments:  Abnormal gait, Decreased activity tolerance, Decreased balance, Decreased endurance, Decreased coordination, Decreased mobility, Decreased range of motion, Increased edema, Difficulty walking, Decreased strength, Impaired perceived  functional ability, Impaired flexibility, Improper body mechanics, Postural dysfunction, Pain  Visit Diagnosis: Pain in right lower leg  Other abnormalities of gait and mobility     Problem List Patient Active Problem List   Diagnosis Date Noted  . Encounter for routine adult physical exam with abnormal findings 08/03/2017  . Mixed hyperlipidemia 08/03/2017  . Acute deep vein thrombosis (DVT) of proximal vein of lower extremity ([place]) 06/21/2017  . Hematoma 06/21/2017  . Chronic sinusitis with  recurrent bronchitis 02/03/2016  . Phlebitis and thrombophlebitis of femoral vein ([place]) 01/26/2011  . 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 01/26/2011   Marissa Wilson, SPT 10/04/2017, 8:45 AM This entire session was performed under direct supervision and direction of a licensed therapist/therapist assistant . I have personally read, edited and approve of the note as written. Janna Arch, PT, DPT   [place] MAIN [facility] SERVICES [address] [place], [place], [address] Phone: [phone]   Fax:  [phone]  Name: Marissa Wilson MRN: [id number] Date of Birth: 04-30-1946

## 2017-10-10 ENCOUNTER — Ambulatory Visit: Payer: PPO

## 2017-10-10 DIAGNOSIS — Z1283 Encounter for screening for malignant neoplasm of skin: Secondary | ICD-10-CM | POA: Diagnosis not present

## 2017-10-10 DIAGNOSIS — R2689 Other abnormalities of gait and mobility: Secondary | ICD-10-CM

## 2017-10-10 DIAGNOSIS — I8393 Asymptomatic varicose veins of bilateral lower extremities: Secondary | ICD-10-CM | POA: Diagnosis not present

## 2017-10-10 DIAGNOSIS — D229 Melanocytic nevi, unspecified: Secondary | ICD-10-CM | POA: Diagnosis not present

## 2017-10-10 DIAGNOSIS — L578 Other skin changes due to chronic exposure to nonionizing radiation: Secondary | ICD-10-CM | POA: Diagnosis not present

## 2017-10-10 DIAGNOSIS — L821 Other seborrheic keratosis: Secondary | ICD-10-CM | POA: Diagnosis not present

## 2017-10-10 DIAGNOSIS — L570 Actinic keratosis: Secondary | ICD-10-CM | POA: Diagnosis not present

## 2017-10-10 DIAGNOSIS — M79661 Pain in right lower leg: Secondary | ICD-10-CM | POA: Diagnosis not present

## 2017-10-10 DIAGNOSIS — D485 Neoplasm of uncertain behavior of skin: Secondary | ICD-10-CM | POA: Diagnosis not present

## 2017-10-10 DIAGNOSIS — D1800 Hemangioma unspecified site: Secondary | ICD-10-CM | POA: Diagnosis not present

## 2017-10-10 NOTE — Therapy (Addendum)
[place] MAIN [facility] SERVICES [address] [place], [place], [address] Phone: [phone]   Fax:  [phone]  Physical Therapy Treatment  Patient Details  Name: Marissa Wilson MRN: [id number] Date of Birth: Sep 30, 1945 Referring Provider: Thomasene Mohair   Encounter Date: 10/10/2017  PT End of Session - 10/10/17 1713    Visit Number  23    Number of Visits  30    Date for PT Re-Evaluation  10/17/17    PT Start Time  1302    PT Stop Time  1345    PT Time Calculation (min)  43 min    Equipment Utilized During Treatment  Gait belt    Activity Tolerance  Patient tolerated treatment well;No increased pain    Behavior During Therapy  WFL for tasks assessed/performed       Past Medical History:  Diagnosis Date  . Asthma   . Gastrointestinal disorder   . GERD (gastroesophageal reflux disease)   . Hyperlipidemia, unspecified   . Hypertension   . Hypothyroid   . Osteoarthritis   . Sleep apnea   . Thyroid disease   . UTI (urinary tract infection)     Past Surgical History:  Procedure Laterality Date  . ABDOMINAL HYSTERECTOMY    . PR ENDOVENOUS LASTER, 1ST VEIN Right 06/09/2017   Procedure: EVLA of Right GSV; Surgeon: Lauretta Grill, MD; Location: MAIN OR [facility]; Service: Vascular  . PR LIGATN LONG SAPHENOUS VEIN AT SEPH-FEM JUNC Right 06/09/2017   Procedure: LIGATION & DIVISION OF LONG SAPHENOUS VEIN AT SAPHENOFEMORAL JUNCTION, OR DISTAL INTERRUPTIONS; Surgeon: Lauretta Grill, MD; Location: MAIN OR [facility] Service: Vascular  . PR PHLEB VEINS EXTREM TO 20 Right 06/09/2017   Procedure: STAB PHLEBECTOMY OF VARICOSE VEINS, 1 EXTREMITY: 10-20 STAB INCISIONS; Surgeon: Lauretta Grill, MD; Location: MAIN OR [facility]; Service: Vascular    There were no vitals filed for this visit.  Subjective Assessment - 10/10/17 1306    Subjective  Patient reports a quiet weekend with little pain.    Pertinent History  Patient had  surgery on October 25th to remove veins in legs, tied saphenous leg off in upper thigh and removed deeper bulging veins. Pt. Stayed overnight at [facility] due to blood clot history. Went home the next day and on Sunday the 28th I had excruciating pain in R calf. Doppler performed on 29th and found a hematoma and placed in hospital for a week. Left hospital and went to [facility]. Had PT for walking for a week. Has a rollater to use in public but has started decreasing use in home. Right calf is wrapped and painful upon posterior calf  Muscular contraction. .    Limitations  Sitting;Lifting;Standing;Walking;House hold activities    How long can you sit comfortably?  30 minutes    How long can you stand comfortably?  10 minutes    How long can you walk comfortably?  when standing    Diagnostic tests  Doppler    Patient Stated Goals  Decrease pain and swelling and improve quality of walking.     Currently in Pain?  Yes    Pain Score  2     Pain Location  Leg    Pain Orientation  Posterior    Pain Descriptors / Indicators  Aching    Pain Type  Acute pain    Pain Onset  More than a month ago    Pain Frequency  Intermittent    Multiple Pain  Sites  No       TREATMENT  Warm-up: Treadmill: 2.0 mph 3% incline x 5 min (unbilled)  Parallel bars: Eccentric heel taps x 15 - min cues for correct exercise technique, keeping leg extended; Patient demonstrates improved alignment through knee suggesting improved strength 6" step ups over BLE x 10  6" step-up to crane stance over BLE x 10 6" step heel raises x 10 over BLE required fingertip UE assist  Calf stretch 1 x 30 sec over BLE   Airex foam:   SLS star pattern with contralateral LE over BLE x 8 rounds - LOB 2 x to L over LLE requiring UE assist  RDL (russian dead lift) cone touches x 10 over BLE - patient unsteady requiring CGA BOSU blue-side marches x 10 BOSU blue-side lunges x 10   Ultrasound to LLE medial calf x 10 min:   3.3 mHz 2 cycles 50%   intensity 1.5w/cm, warm head      PT Education - 10/10/17 1713    Education provided  Yes    Education Details  balance progression and strengthening; d/c plan    Person(s) Educated  Patient    Methods  Explanation;Demonstration;Verbal cues    Comprehension  Verbalized understanding;Returned demonstration;Verbal cues required       PT Short Term Goals - 09/19/17 1448      PT SHORT TERM GOAL #1   Title  Patient will increase 10 meter walk test to >1.35ms as to improve gait speed for better community ambulation and to reduce fall risk.    Baseline  11/19: .77 m/s  12/13: 1.43 m/s    Time  2    Period  Weeks    Status  Achieved      PT SHORT TERM GOAL #2   Title  Patient will increase ABC scale score >60% to demonstrate better functional mobility and better confidence with ADLs.     Baseline  11/19: 53.1% 12/13: 76%    Time  2    Period  Weeks    Status  Achieved      PT SHORT TERM GOAL #3   Title  Patient will have circumfrential measurements of calves within 1 cm of each other for decreased swelling and pain relief    Baseline  R 32.5 L 28   R 28.5 L 24.5;  1/9: R 25, L 24.5 ; 2/4: R 24.75, L 24.5    Time  2    Period  Weeks    Status  Achieved        PT Long Term Goals - 09/19/17 1444      PT LONG TERM GOAL #1   Title  Patient will increase ABC scale score >80% to demonstrate better functional mobility and better confidence with ADLs.     Baseline  11/19:  53.1% 12/13: 76.25% 2/4: 79.37%    Time  4    Period  Weeks    Status  Partially Met    Target Date  10/17/17      PT LONG TERM GOAL #2   Title  Patient will report a worst pain of 3/10 on VAS in  R calf to improve tolerance with ADLs and reduced symptoms with activities.     Baseline  11/19: 7/10 pain, 12/13: worst pain 5/10 1/9:  5/10 at night 2/4: 4/10     Time  4    Period  Weeks    Status  Partially Met    Target Date  10/17/17      PT LONG TERM GOAL #3   Title  Patient will increase lower extremity  functional scale to >60/80 to demonstrate improved functional mobility and increased tolerance with ADLs.     Baseline  11/19: 40/80 12/13: 60/80    Time  4    Period  Weeks    Status  Achieved      PT LONG TERM GOAL #4   Title  Patient will ambulate with equal weight acceptance upon L and R LE's to demonstrate improved quality of movement    Baseline  equal weight acceptance with decreased stability upon R ankle     Time  4    Period  Weeks    Status  Achieved      PT LONG TERM GOAL #5   Title  Patient will ambulate 1644 ft with 6 minute walk test to meet age norm for return to previous level of function.     Baseline  11/21: 1080 ft 12/13: 1600 ft  1/9: 1764     Time  4    Period  Weeks    Status  Achieved      Additional Long Term Goals   Additional Long Term Goals  Yes      PT LONG TERM GOAL #6   Title  Patient will increase RLE single limb stance to >/= 10 seconds to improve patient's stability during dynamic mobility.     Baseline  4 seconds    Time  4    Period  Weeks    Status  New    Target Date  10/17/17            Plan - 10/10/17 1714    Clinical Impression Statement  Patient demonstrates improvements with advanced SLS exercises over BLE able to perform with no UE assist or fingertip assist when challenged demonstrating improved confidence with balance. Area of bruising over posterior calf reduced relative to prior session. Patient will continue to benefit from skilled physical therapy to decrease pain, swelling, and improve mobility for increased QOL.    Rehab Potential  Fair    Clinical Impairments Affecting Rehab Potential  hematoma, DVTs, GERD, thyroid    PT Frequency  2x / week    PT Duration  4 weeks    PT Treatment/Interventions  ADLs/Self Care Home Management;Electrical Stimulation;Cryotherapy;Biofeedback;Iontophoresis '4mg'$ /ml Dexamethasone;Moist Heat;Ultrasound;Parrafin;Therapeutic exercise;Therapeutic activities;Functional mobility training;Stair  training;Gait training;DME Instruction;Balance training;Neuromuscular re-education;Patient/family education;Compression bandaging;Manual techniques;Passive range of motion;Dry needling;Energy conservation;Visual/perceptual remediation/compensation    PT Next Visit Plan  ultrasound, balance, recruiting medial calf musculature.     Consulted and Agree with Plan of Care  Patient       Patient will benefit from skilled therapeutic intervention in order to improve the following deficits and impairments:  Abnormal gait, Decreased activity tolerance, Decreased balance, Decreased endurance, Decreased coordination, Decreased mobility, Decreased range of motion, Increased edema, Difficulty walking, Decreased strength, Impaired perceived functional ability, Impaired flexibility, Improper body mechanics, Postural dysfunction, Pain  Visit Diagnosis: Pain in right lower leg  Other abnormalities of gait and mobility     Problem List Patient Active Problem List   Diagnosis Date Noted  . Encounter for routine adult physical exam with abnormal findings 08/03/2017  . Mixed hyperlipidemia 08/03/2017  . Acute deep vein thrombosis (DVT) of proximal vein of lower extremity ([place]) 06/21/2017  . Hematoma 06/21/2017  . Chronic sinusitis with recurrent bronchitis 02/03/2016  . Phlebitis and thrombophlebitis of femoral vein ([place]) 01/26/2011  . 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 I have personally read, edited and approve of the note as written. Janna Arch, PT, DPT   [place] MAIN [facility] SERVICES [address] [place], [place], [address] Phone: [phone]   Fax:  [phone]  Name: Marissa [place] MRN: [id number] Date of Birth: 1946-06-09

## 2017-10-18 DIAGNOSIS — I8010 Phlebitis and thrombophlebitis of unspecified femoral vein: Secondary | ICD-10-CM | POA: Diagnosis not present

## 2017-10-19 ENCOUNTER — Ambulatory Visit: Payer: PPO

## 2017-11-17 ENCOUNTER — Other Ambulatory Visit: Payer: Self-pay | Admitting: Internal Medicine

## 2018-01-06 DIAGNOSIS — D485 Neoplasm of uncertain behavior of skin: Secondary | ICD-10-CM | POA: Diagnosis not present

## 2018-01-06 DIAGNOSIS — L638 Other alopecia areata: Secondary | ICD-10-CM | POA: Diagnosis not present

## 2018-01-16 ENCOUNTER — Ambulatory Visit
Admission: RE | Admit: 2018-01-16 | Discharge: 2018-01-16 | Disposition: A | Discharge status: Home / Self Care | Payer: PPO | Source: Ambulatory Visit | Attending: Internal Medicine | Admitting: Internal Medicine

## 2018-01-16 DIAGNOSIS — Z1231 Encounter for screening mammogram for malignant neoplasm of breast: Secondary | ICD-10-CM | POA: Insufficient documentation

## 2018-01-16 DIAGNOSIS — D0339 Melanoma in situ of other parts of face: Secondary | ICD-10-CM | POA: Diagnosis not present

## 2018-01-16 DIAGNOSIS — Z1239 Encounter for other screening for malignant neoplasm of breast: Secondary | ICD-10-CM

## 2018-01-16 DIAGNOSIS — L659 Nonscarring hair loss, unspecified: Secondary | ICD-10-CM | POA: Diagnosis not present

## 2018-01-16 DIAGNOSIS — L639 Alopecia areata, unspecified: Secondary | ICD-10-CM | POA: Diagnosis not present

## 2018-01-16 DIAGNOSIS — L578 Other skin changes due to chronic exposure to nonionizing radiation: Secondary | ICD-10-CM | POA: Diagnosis not present

## 2018-01-16 DIAGNOSIS — D485 Neoplasm of uncertain behavior of skin: Secondary | ICD-10-CM | POA: Diagnosis not present

## 2018-01-16 DIAGNOSIS — D039 Melanoma in situ, unspecified: Secondary | ICD-10-CM

## 2018-01-16 HISTORY — DX: Melanoma in situ, unspecified: D03.9

## 2018-01-24 DIAGNOSIS — L638 Other alopecia areata: Secondary | ICD-10-CM | POA: Diagnosis not present

## 2018-01-24 DIAGNOSIS — L578 Other skin changes due to chronic exposure to nonionizing radiation: Secondary | ICD-10-CM | POA: Diagnosis not present

## 2018-01-24 DIAGNOSIS — L659 Nonscarring hair loss, unspecified: Secondary | ICD-10-CM | POA: Diagnosis not present

## 2018-01-24 DIAGNOSIS — D0339 Melanoma in situ of other parts of face: Secondary | ICD-10-CM | POA: Diagnosis not present

## 2018-01-25 ENCOUNTER — Encounter: Payer: Self-pay | Admitting: Internal Medicine

## 2018-01-25 ENCOUNTER — Ambulatory Visit (INDEPENDENT_AMBULATORY_CARE_PROVIDER_SITE_OTHER): Payer: PPO | Admitting: Internal Medicine

## 2018-01-25 VITALS — BP 148/86 | HR 75 | Resp 16 | Ht 62.0 in | Wt 146.0 lb

## 2018-01-25 DIAGNOSIS — C439 Malignant melanoma of skin, unspecified: Secondary | ICD-10-CM | POA: Diagnosis not present

## 2018-01-25 DIAGNOSIS — J3089 Other allergic rhinitis: Secondary | ICD-10-CM | POA: Diagnosis not present

## 2018-01-25 DIAGNOSIS — E039 Hypothyroidism, unspecified: Secondary | ICD-10-CM

## 2018-01-25 DIAGNOSIS — K219 Gastro-esophageal reflux disease without esophagitis: Secondary | ICD-10-CM

## 2018-01-25 DIAGNOSIS — R7309 Other abnormal glucose: Secondary | ICD-10-CM | POA: Diagnosis not present

## 2018-01-25 LAB — POCT GLYCOSYLATED HEMOGLOBIN (HGB A1C): Hemoglobin A1C: 5.2 % (ref 4.0–5.6)

## 2018-01-25 NOTE — Progress Notes (Signed)
[facility] [place], [place] [address]  Internal MEDICINE  Office Visit Note  Patient Name: Marissa [place]  [id number]  [id number]  Date of Service: 01/27/2018  Chief Complaint  Patient presents with  . Hypothyroidism  . Hypertension  . Other    surgery for varicose veins and Melanoma of left cheek    HPI Pt is here for routine follow up. She is feeling nervous since dx of Left cheek Melanoma. It was removed by dermatology and now scheduled to have mohs surgery at [facility]. Right leg looks good after surgical correction of tortious and large varicose veins( developed DVT before). She will schedule left leg later on in the year. Labs done in December of 2018 were within normal limits. Cholesetrol is improved    Current Medication: Outpatient Encounter Medications as of 01/25/2018  Medication Sig  . acetaminophen (TYLENOL) 650 MG CR tablet Take 650 mg every 6 (six) hours by mouth.  . Cholecalciferol 1000 units capsule Take 1,000 Units daily by mouth.  . levothyroxine (SYNTHROID, LEVOTHROID) 75 MCG tablet TAKE 1 TABLET BY MOUTH EVERY DAY  . montelukast (SINGULAIR) 10 MG tablet Take 1 tablet (10 mg total) by mouth at bedtime.  Marland Kitchen omeprazole (PRILOSEC) 40 MG capsule Take 1 capsule (40 mg total) by mouth daily.  . [DISCONTINUED] omeprazole (PRILOSEC) 40 MG capsule TAKE 1 CAPSULE BY MOUTH EVERY DAY  . triamterene-hydrochlorothiazide (DYAZIDE) 37.5-25 MG capsule Take 1 capsule daily by mouth.  . [DISCONTINUED] Calcium Carb-Cholecalciferol 500-200 MG-UNIT TABS Take 1 tablet daily by mouth.  . [DISCONTINUED] clotrimazole-betamethasone (LOTRISONE) cream APPLY TO THE LEG AT BEDTIME (Patient not taking: Reported on 01/25/2018)  . [DISCONTINUED] docusate sodium (COLACE) 100 MG capsule Take 100 mg 2 (two) times daily by mouth.  . [DISCONTINUED] lidocaine (LIDODERM) 5 % Place 1 patch daily onto the skin. Apply to right calf , Remove & Discard patch within 12 hours or as directed  by MD  . [DISCONTINUED] methocarbamol (ROBAXIN) 500 MG tablet Take 500 mg every 6 (six) hours as needed by mouth for muscle spasms.  . [DISCONTINUED] Oxycodone HCl 10 MG TABS Take 1 tablet (10 mg total) every 4 (four) hours as needed by mouth. (Patient not taking: Reported on 08/03/2017)  . [DISCONTINUED] polyethylene glycol (MIRALAX / GLYCOLAX) packet Take 17 g 2 (two) times daily by mouth.  . [DISCONTINUED] XARELTO 10 MG TABS tablet Take 10 mg daily by mouth.    No facility-administered encounter medications on file as of 01/25/2018.     Surgical History: Past Surgical History:  Procedure Laterality Date  . ABDOMINAL HYSTERECTOMY    . PR ENDOVENOUS LASTER, 1ST VEIN Right 06/09/2017   Procedure: EVLA of Right GSV; Surgeon: Lauretta Grill, MD; Location: MAIN OR [facility]; Service: Vascular  . PR LIGATN LONG SAPHENOUS VEIN AT SEPH-FEM JUNC Right 06/09/2017   Procedure: LIGATION & DIVISION OF LONG SAPHENOUS VEIN AT SAPHENOFEMORAL JUNCTION, OR DISTAL INTERRUPTIONS; Surgeon: Lauretta Grill, MD; Location: MAIN OR [facility]; Service: Vascular  . PR PHLEB VEINS EXTREM TO 20 Right 06/09/2017   Procedure: STAB PHLEBECTOMY OF VARICOSE VEINS, 1 EXTREMITY: 10-20 STAB INCISIONS; Surgeon: Lauretta Grill, MD; Location: MAIN OR [facility]; Service: Vascular    Medical History: Past Medical History:  Diagnosis Date  . Asthma   . Gastrointestinal disorder   . GERD (gastroesophageal reflux disease)   . Hyperlipidemia, unspecified   . Hypertension   . Hypothyroid   . Osteoarthritis   . Sleep apnea   . Thyroid  disease   . UTI (urinary tract infection)     Family History: Family History  Problem Relation Age of Onset  . Breast cancer Mother 30  . Varicose Veins Mother   . Lung cancer Father   . Bladder Cancer Neg Hx   . Kidney cancer Neg Hx     Social History   Socioeconomic History  . Marital status: Married    Spouse name: Nada Boozer  . Number of children: 2  . Years of education: 80   . Highest education level: Some college, no degree  Occupational History  . Not on file  Social Needs  . Financial resource strain: Not on file  . Food insecurity:    Worry: Not on file    Inability: Not on file  . Transportation needs:    Medical: Not on file    Non-medical: Not on file  Tobacco Use  . Smoking status: Never Smoker  . Smokeless tobacco: Never Used  Substance and Sexual Activity  . Alcohol use: Yes    Comment: occasional wine  . Drug use: No  . Sexual activity: Not on file  Lifestyle  . Physical activity:    Days per week: Not on file    Minutes per session: Not on file  . Stress: Not on file  Relationships  . Social connections:    Talks on phone: Not on file    Gets together: Not on file    Attends religious service: Not on file    Active member of club or organization: Not on file    Attends meetings of clubs or organizations: Not on file    Relationship status: Not on file  . Intimate partner violence:    Fear of current or ex partner: Not on file    Emotionally abused: Not on file    Physically abused: Not on file    Forced sexual activity: Not on file  Other Topics Concern  . Not on file  Social History Narrative  . Not on file   Review of Systems  Constitutional: Negative for chills, diaphoresis and fatigue.  HENT: Negative for ear pain, postnasal drip and sinus pressure.   Eyes: Negative for photophobia, discharge, redness, itching and visual disturbance.  Respiratory: Negative for cough, shortness of breath and wheezing.   Cardiovascular: Negative for chest pain, palpitations and leg swelling.  Gastrointestinal: Negative for abdominal pain, constipation, diarrhea, nausea and vomiting.  Genitourinary: Negative for dysuria and flank pain.  Musculoskeletal: Negative for arthralgias, back pain, gait problem and neck pain.  Skin: Negative for color change.  Allergic/Immunologic: Negative for environmental allergies and food allergies.   Neurological: Negative for dizziness and headaches.  Hematological: Does not bruise/bleed easily.  Psychiatric/Behavioral: Negative for agitation, behavioral problems (depression) and hallucinations.   Vital Signs: BP (!) 148/86 (BP Location: Right Arm, Patient Position: Sitting, Cuff Size: Normal)   Pulse 75   Resp 16   Ht 5\' 2"  (1.575 m)   Wt 146 lb (66.2 kg)   SpO2 97%   BMI 26.70 kg/m    Physical Exam  Constitutional: She is oriented to person, place, and time. She appears well-developed and well-nourished. No distress.  HENT:  Head: Normocephalic and atraumatic.  Mouth/Throat: Oropharynx is clear and moist. No oropharyngeal exudate.  Eyes: Pupils are equal, round, and reactive to light. EOM are normal.  Neck: Normal range of motion. Neck supple. No JVD present. No tracheal deviation present. No thyromegaly present.  Cardiovascular: Normal rate, regular rhythm and  normal heart sounds. Exam reveals no gallop and no friction rub.  No murmur heard. Pulmonary/Chest: Effort normal. No respiratory distress. She has no wheezes. She has no rales. She exhibits no tenderness.  Abdominal: Soft. Bowel sounds are normal.  Musculoskeletal: Normal range of motion.  Lymphadenopathy:    She has no cervical adenopathy.  Neurological: She is alert and oriented to person, place, and time. No cranial nerve deficit.  Skin: Skin is warm and dry. She is not diaphoretic.  Psychiatric: She has a normal mood and affect. Her behavior is normal. Judgment and thought content normal.   Assessment/Plan: 1. Hypothyroidism, unspecified type - Continue Synthroid as before   2. Non-seasonal allergic rhinitis, unspecified trigger - Controlled   3. Other abnormal glucose - POCT glycosylated hemoglobin (Hb A1C). Pt is not a diabetic. Was on prednisone in the past with slightly elevated glucose but resolved now. No need for f/u 4. Melanoma of skin ([place]) - Per Dermatology  5. Gastroesophageal reflux  disease without esophagitis - Controlled   General Counseling: Ladean verbalizes understanding of the findings of todays visit and agrees with plan of treatment. I have discussed any further diagnostic evaluation that may be needed or ordered today. We also reviewed her medications today. she has been encouraged to call the office with any questions or concerns that should arise related to todays visit. Need for pneumonia vaccine is addressed   Orders Placed This Encounter  Procedures  . Pneumococcal polysaccharide vaccine 23-valent greater than or equal to 2yo subcutaneous/IM  . POCT glycosylated hemoglobin (Hb A1C)    Meds ordered this encounter  Medications  . omeprazole (PRILOSEC) 40 MG capsule    Sig: Take 1 capsule (40 mg total) by mouth daily.    Dispense:  30 capsule    Refill:  12   Time spent:20 Minutes  Dr Lavera Guise Internal medicine

## 2018-01-27 MED ORDER — OMEPRAZOLE 40 MG PO CPDR: 40.0000 mg | DELAYED_RELEASE_CAPSULE | Freq: Every day | ORAL | 12 refills | Status: DC

## 2018-01-31 ENCOUNTER — Ambulatory Visit: Payer: Self-pay | Admitting: Internal Medicine

## 2018-02-09 DIAGNOSIS — I8002 Phlebitis and thrombophlebitis of superficial vessels of left lower extremity: Secondary | ICD-10-CM | POA: Diagnosis not present

## 2018-03-01 DIAGNOSIS — D0339 Melanoma in situ of other parts of face: Secondary | ICD-10-CM | POA: Diagnosis not present

## 2018-03-01 DIAGNOSIS — L578 Other skin changes due to chronic exposure to nonionizing radiation: Secondary | ICD-10-CM | POA: Diagnosis not present

## 2018-03-01 DIAGNOSIS — D039 Melanoma in situ, unspecified: Secondary | ICD-10-CM | POA: Diagnosis not present

## 2018-03-01 DIAGNOSIS — L814 Other melanin hyperpigmentation: Secondary | ICD-10-CM | POA: Diagnosis not present

## 2018-03-01 DIAGNOSIS — D0330 Melanoma in situ of unspecified part of face: Secondary | ICD-10-CM | POA: Diagnosis not present

## 2018-03-01 DIAGNOSIS — L988 Other specified disorders of the skin and subcutaneous tissue: Secondary | ICD-10-CM | POA: Diagnosis not present

## 2018-04-03 ENCOUNTER — Other Ambulatory Visit: Payer: Self-pay | Admitting: Internal Medicine

## 2018-04-04 DIAGNOSIS — G43101 Migraine with aura, not intractable, with status migrainosus: Secondary | ICD-10-CM | POA: Diagnosis not present

## 2018-04-25 DIAGNOSIS — H25813 Combined forms of age-related cataract, bilateral: Secondary | ICD-10-CM | POA: Diagnosis not present

## 2018-04-26 DIAGNOSIS — L578 Other skin changes due to chronic exposure to nonionizing radiation: Secondary | ICD-10-CM | POA: Diagnosis not present

## 2018-04-26 DIAGNOSIS — D229 Melanocytic nevi, unspecified: Secondary | ICD-10-CM | POA: Diagnosis not present

## 2018-04-26 DIAGNOSIS — D1800 Hemangioma unspecified site: Secondary | ICD-10-CM | POA: Diagnosis not present

## 2018-04-26 DIAGNOSIS — Z1283 Encounter for screening for malignant neoplasm of skin: Secondary | ICD-10-CM | POA: Diagnosis not present

## 2018-04-26 DIAGNOSIS — I8393 Asymptomatic varicose veins of bilateral lower extremities: Secondary | ICD-10-CM | POA: Diagnosis not present

## 2018-04-26 DIAGNOSIS — L820 Inflamed seborrheic keratosis: Secondary | ICD-10-CM | POA: Diagnosis not present

## 2018-04-26 DIAGNOSIS — Z85820 Personal history of malignant melanoma of skin: Secondary | ICD-10-CM | POA: Diagnosis not present

## 2018-04-26 DIAGNOSIS — L918 Other hypertrophic disorders of the skin: Secondary | ICD-10-CM | POA: Diagnosis not present

## 2018-04-26 DIAGNOSIS — L821 Other seborrheic keratosis: Secondary | ICD-10-CM | POA: Diagnosis not present

## 2018-05-10 DIAGNOSIS — Z85828 Personal history of other malignant neoplasm of skin: Secondary | ICD-10-CM | POA: Diagnosis not present

## 2018-05-25 ENCOUNTER — Encounter: Payer: Self-pay | Admitting: Internal Medicine

## 2018-05-25 NOTE — Telephone Encounter (Signed)
How can I put this in her chart? Flu shot received at her pharmacy?

## 2018-06-12 DIAGNOSIS — I8310 Varicose veins of unspecified lower extremity with inflammation: Secondary | ICD-10-CM | POA: Diagnosis not present

## 2018-07-29 ENCOUNTER — Other Ambulatory Visit: Payer: Self-pay | Admitting: Internal Medicine

## 2018-08-02 ENCOUNTER — Ambulatory Visit (INDEPENDENT_AMBULATORY_CARE_PROVIDER_SITE_OTHER): Payer: PPO | Admitting: Adult Health

## 2018-08-02 ENCOUNTER — Encounter: Payer: Self-pay | Admitting: Adult Health

## 2018-08-02 ENCOUNTER — Other Ambulatory Visit: Payer: Self-pay | Admitting: Adult Health

## 2018-08-02 VITALS — BP 100/68 | HR 72 | Resp 16 | Ht 62.0 in | Wt 151.4 lb

## 2018-08-02 DIAGNOSIS — E039 Hypothyroidism, unspecified: Secondary | ICD-10-CM

## 2018-08-02 DIAGNOSIS — I10 Essential (primary) hypertension: Secondary | ICD-10-CM

## 2018-08-02 DIAGNOSIS — R300 Dysuria: Secondary | ICD-10-CM | POA: Diagnosis not present

## 2018-08-02 DIAGNOSIS — J3089 Other allergic rhinitis: Secondary | ICD-10-CM

## 2018-08-02 DIAGNOSIS — K219 Gastro-esophageal reflux disease without esophagitis: Secondary | ICD-10-CM

## 2018-08-02 DIAGNOSIS — Z0001 Encounter for general adult medical examination with abnormal findings: Secondary | ICD-10-CM

## 2018-08-02 DIAGNOSIS — E782 Mixed hyperlipidemia: Secondary | ICD-10-CM | POA: Diagnosis not present

## 2018-08-02 MED ORDER — PNEUMOCOCCAL 13-VAL CONJ VACC IM SUSP: 0.5000 mL | INTRAMUSCULAR | 0 refills | Status: AC

## 2018-08-02 NOTE — Patient Instructions (Signed)

## 2018-08-02 NOTE — Progress Notes (Signed)
[facility] [place], [place] [address]  Internal MEDICINE  Office Visit Note  Patient Name: Marissa Wilson  [id number]  [id number]  Date of Service: 08/02/2018  Chief Complaint  Patient presents with  . Annual Exam    medicare annual well visit   . Hypothyroidism  . Hyperlipidemia  . Gastroesophageal Reflux     HPI Pt is here for routine health maintenance examination.  Pt is a 72 YO well appearing caucasian female.  She has a history of hypothyroidism, hypertension, hyperlipidemia, allergic rhinitis and GERD.  Patient denies any issues and states that she is generally doing well at this time.  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  She reports that other than being a little tired from that she is feeling great.  She denies any overt complaints and reports that she has been taking her medications as prescribed.  She denies tobacco, alcohol, or illicit drug use.  Current Medication: Outpatient Encounter Medications as of 08/02/2018  Medication Sig  . acetaminophen (TYLENOL) 650 MG CR tablet Take 650 mg every 6 (six) hours by mouth.  . Cholecalciferol 1000 units capsule Take 1,000 Units daily by mouth.  . montelukast (SINGULAIR) 10 MG tablet Take 1 tablet (10 mg total) by mouth at bedtime.  Marland Kitchen omeprazole (PRILOSEC) 40 MG capsule Take 1 capsule (40 mg total) by mouth daily.  . rivaroxaban (XARELTO) 10 MG TABS tablet Take 1 day before, day of, and day after events with risk for clots  . triamterene-hydrochlorothiazide (DYAZIDE) 37.5-25 MG capsule TAKE 1 CAPSULE BY MOUTH IN THE MORNING FOR FLUID  . levothyroxine (SYNTHROID, LEVOTHROID) 75 MCG tablet TAKE 1 TABLET BY MOUTH EVERY DAY   No facility-administered encounter medications on file as of 08/02/2018.     Surgical History: Past Surgical History:  Procedure Laterality Date  . ABDOMINAL HYSTERECTOMY    . PR ENDOVENOUS LASTER, 1ST VEIN Right  06/09/2017   Procedure: EVLA of Right GSV; Surgeon: Lauretta Grill, MD; Location: MAIN OR [facility]; Service: Vascular  . PR LIGATN LONG SAPHENOUS VEIN AT SEPH-FEM JUNC Right 06/09/2017   Procedure: LIGATION & DIVISION OF LONG SAPHENOUS VEIN AT SAPHENOFEMORAL JUNCTION, OR DISTAL INTERRUPTIONS; Surgeon: Lauretta Grill, MD; Location: MAIN OR [facility]; Service: Vascular  . PR PHLEB VEINS EXTREM TO 20 Right 06/09/2017   Procedure: STAB PHLEBECTOMY OF VARICOSE VEINS, 1 EXTREMITY: 10-20 STAB INCISIONS; Surgeon: Lauretta Grill, MD; Location: MAIN OR [facility]; Service: Vascular    Medical History: Past Medical History:  Diagnosis Date  . Asthma   . Gastrointestinal disorder   . GERD (gastroesophageal reflux disease)   . Hx of melanoma excision   . Hyperlipidemia, unspecified   . Hypertension   . Hypothyroid   . Osteoarthritis   . Sleep apnea   . Thyroid disease   . UTI (urinary tract infection)     Family History: Family History  Problem Relation Age of Onset  . Breast cancer Mother 49  . Varicose Veins Mother   . Lung cancer Father   . Bladder Cancer Neg Hx   . Kidney cancer Neg Hx    Depression screen [facility] 2/9 08/02/2018 08/02/2018 08/03/2017  Decreased Interest 0 0 0  Down, Depressed, Hopeless 0 0 0  PHQ - 2 Score 0 0 0    Functional Status Survey: Is the patient deaf or have difficulty hearing?: No Does the patient have difficulty seeing, even when wearing glasses/contacts?:  No Does the patient have difficulty concentrating, remembering, or making decisions?: No Does the patient have difficulty walking or climbing stairs?: No Does the patient have difficulty dressing or bathing?: No Does the patient have difficulty doing errands alone such as visiting a doctor's office or shopping?: No  MMSE - [place] Exam 08/02/2018  Orientation to time 5  Orientation to Place 5  Registration 3  Attention/ Calculation 5  Recall 3  Language- name 2 objects 2   Language- repeat 1  Language- follow 3 step command 3  Language- read & follow direction 1  Write a sentence 1  Copy design 1  Total score 30    Fall Risk  08/02/2018 08/02/2018 08/03/2017  Falls in the past year? 0 0 No      Review of Systems  Constitutional: Negative for chills, fatigue and unexpected weight change.  HENT: Negative for congestion, rhinorrhea, sneezing and sore throat.   Eyes: Negative for photophobia, pain and redness.  Respiratory: Negative for cough, chest tightness and shortness of breath.   Cardiovascular: Negative for chest pain and palpitations.  Gastrointestinal: Negative for abdominal pain, constipation, diarrhea, nausea and vomiting.  Endocrine: Negative.   Genitourinary: Negative for dysuria and frequency.  Musculoskeletal: Negative for arthralgias, back pain, joint swelling and neck pain.  Skin: Negative for rash.  Allergic/Immunologic: Negative.   Neurological: Negative for tremors and numbness.  Hematological: Negative for adenopathy. Does not bruise/bleed easily.  Psychiatric/Behavioral: Negative for behavioral problems and sleep disturbance. The patient is not nervous/anxious.      Vital Signs: BP 100/68 (BP Location: Left Arm, Patient Position: Sitting, Cuff Size: Normal)   Pulse 72   Resp 16   Ht 5\' 2"  (1.575 m)   Wt 151 lb 6.4 oz (68.7 kg)   SpO2 94%   BMI 27.69 kg/m    Physical Exam Vitals signs and nursing note reviewed. Exam conducted with a chaperone present.  Constitutional:      General: She is not in acute distress.    Appearance: She is well-developed. She is not diaphoretic.  HENT:     Head: Normocephalic and atraumatic.     Mouth/Throat:     Pharynx: No oropharyngeal exudate.  Eyes:     Pupils: Pupils are equal, round, and reactive to light.  Neck:     Musculoskeletal: Normal range of motion and neck supple.     Thyroid: No thyromegaly.     Vascular: No JVD.     Trachea: No tracheal deviation.  Cardiovascular:      Rate and Rhythm: Normal rate and regular rhythm.     Heart sounds: Normal heart sounds. No murmur. No friction rub. No gallop.   Pulmonary:     Effort: Pulmonary effort is normal. No respiratory distress.     Breath sounds: Normal breath sounds. No wheezing or rales.  Chest:     Chest wall: No tenderness.     Breasts:        Right: No swelling, bleeding, inverted nipple, mass, nipple discharge, skin change or tenderness.        Left: No swelling, bleeding, inverted nipple, mass, nipple discharge, skin change or tenderness.     Comments: Exam Chaperoned by Dorna Bloom CMA Abdominal:     Palpations: Abdomen is soft.     Tenderness: There is no abdominal tenderness. There is no guarding.  Musculoskeletal: Normal range of motion.  Lymphadenopathy:     Cervical: No cervical adenopathy.  Skin:    General:  Skin is warm and dry.  Neurological:     Mental Status: She is alert and oriented to person, place, and time.     Cranial Nerves: No cranial nerve deficit.  Psychiatric:        Behavior: Behavior normal.        Thought Content: Thought content normal.        Judgment: Judgment normal.    LABS: No results found for this or any previous visit (from the past 2160 hour(s)).   Assessment/Plan: 1. Encounter for general adult medical examination with abnormal findings Patient is up-to-date on preventative health maintenance. - CBC with Differential/Platelet - Lipid Panel With LDL/HDL Ratio - TSH - T4, free - Comprehensive metabolic panel  2. Hypothyroidism, unspecified type Stable will check TSH and free T4 with labs.  3. Essential hypertension Stable, continue current medications as prescribed.  4. Non-seasonal allergic rhinitis, unspecified trigger Stable, patient denies complaints.  Continue taking medications as discussed.  5. Gastroesophageal reflux disease without esophagitis Stable, continue supportive measures.  6. Mixed hyperlipidemia Patient's lipid panel will  be assessed on labs.  7. Dysuria Urine sent for microscopic examination - UA/M w/rflx Culture, Routine - Microscopic Examination  General Counseling: Zoejane verbalizes understanding of the findings of todays visit and agrees with plan of treatment. I have discussed any further diagnostic evaluation that may be needed or ordered today. We also reviewed her medications today. she has been encouraged to call the office with any questions or concerns that should arise related to todays visit.   Orders Placed This Encounter  Procedures  . UA/M w/rflx Culture, Routine    No orders of the defined types were placed in this encounter.   Time spent: 35 Minutes   This patient was seen by Orson Gear AGNP-C in Collaboration with Dr Lavera Guise as a part of collaborative care agreement    Kendell Bane AGNP-C Internal Medicine

## 2018-08-03 LAB — UA/M W/RFLX CULTURE, ROUTINE
Bilirubin, UA: NEGATIVE
Glucose, UA: NEGATIVE
KETONES UA: NEGATIVE
LEUKOCYTES UA: NEGATIVE
NITRITE UA: NEGATIVE
PH UA: 7 (ref 5.0–7.5)
Protein, UA: NEGATIVE
RBC UA: NEGATIVE
SPEC GRAV UA: 1.015 (ref 1.005–1.030)
UUROB: 1 mg/dL (ref 0.2–1.0)

## 2018-08-03 LAB — MICROSCOPIC EXAMINATION
CASTS: NONE SEEN /LPF
Epithelial Cells (non renal): NONE SEEN /hpf (ref 0–10)

## 2018-08-06 ENCOUNTER — Encounter: Payer: Self-pay | Admitting: Adult Health

## 2018-08-08 ENCOUNTER — Telehealth: Payer: Self-pay

## 2018-08-08 NOTE — Telephone Encounter (Signed)
Informed pt per Marissa Wilson her lab work orders have been sent and she need to get her lab work done.

## 2018-08-08 NOTE — Telephone Encounter (Signed)
-----   Message from Kendell Bane, NP sent at 08/06/2018 11:20 AM EST ----- Regarding: labs Please call patient and let her know I ordered labs for her Physical from last week.  If she would please go by and get them drawn.

## 2018-08-15 DIAGNOSIS — Z7901 Long term (current) use of anticoagulants: Secondary | ICD-10-CM | POA: Diagnosis not present

## 2018-08-15 DIAGNOSIS — I82812 Embolism and thrombosis of superficial veins of left lower extremities: Secondary | ICD-10-CM | POA: Diagnosis not present

## 2018-08-15 DIAGNOSIS — R1012 Left upper quadrant pain: Secondary | ICD-10-CM | POA: Diagnosis not present

## 2018-08-15 DIAGNOSIS — I871 Compression of vein: Secondary | ICD-10-CM | POA: Diagnosis not present

## 2018-08-15 DIAGNOSIS — Z86718 Personal history of other venous thrombosis and embolism: Secondary | ICD-10-CM | POA: Diagnosis not present

## 2018-08-15 DIAGNOSIS — I8002 Phlebitis and thrombophlebitis of superficial vessels of left lower extremity: Secondary | ICD-10-CM | POA: Diagnosis not present

## 2018-08-15 DIAGNOSIS — R0602 Shortness of breath: Secondary | ICD-10-CM | POA: Diagnosis not present

## 2018-08-15 DIAGNOSIS — I8010 Phlebitis and thrombophlebitis of unspecified femoral vein: Secondary | ICD-10-CM | POA: Diagnosis not present

## 2018-08-17 ENCOUNTER — Other Ambulatory Visit: Payer: Self-pay

## 2018-08-17 NOTE — Addendum Note (Signed)
Addended by: Kendell Bane on: 08/17/2018 12:21 PM   Modules accepted: Orders

## 2018-08-18 DIAGNOSIS — Z0001 Encounter for general adult medical examination with abnormal findings: Secondary | ICD-10-CM | POA: Diagnosis not present

## 2018-08-19 LAB — COMPREHENSIVE METABOLIC PANEL
ALT: 17 IU/L (ref 0–32)
AST: 19 IU/L (ref 0–40)
Albumin/Globulin Ratio: 1.9 (ref 1.2–2.2)
Albumin: 4.2 g/dL (ref 3.5–4.8)
Alkaline Phosphatase: 76 IU/L (ref 39–117)
BUN/Creatinine Ratio: 19 (ref 12–28)
BUN: 13 mg/dL (ref 8–27)
Bilirubin Total: 0.6 mg/dL (ref 0.0–1.2)
CO2: 21 mmol/L (ref 20–29)
CREATININE: 0.68 mg/dL (ref 0.57–1.00)
Calcium: 9.7 mg/dL (ref 8.7–10.3)
Chloride: 104 mmol/L (ref 96–106)
GFR calc Af Amer: 100 mL/min/{1.73_m2} (ref >59)
GFR calc non Af Amer: 87 mL/min/{1.73_m2} (ref >59)
Globulin, Total: 2.2 g/dL (ref 1.5–4.5)
Glucose: 84 mg/dL (ref 65–99)
Potassium: 4.5 mmol/L (ref 3.5–5.2)
Sodium: 142 mmol/L (ref 134–144)
Total Protein: 6.4 g/dL (ref 6.0–8.5)

## 2018-08-19 LAB — LIPID PANEL WITH LDL/HDL RATIO
CHOLESTEROL TOTAL: 214 mg/dL — AB (ref 100–199)
HDL: 59 mg/dL (ref >39)
LDL Calculated: 129 mg/dL — ABNORMAL HIGH (ref 0–99)
LDl/HDL Ratio: 2.2 ratio (ref 0.0–3.2)
Triglycerides: 128 mg/dL (ref 0–149)
VLDL Cholesterol Cal: 26 mg/dL (ref 5–40)

## 2018-08-19 LAB — TSH: TSH: 2.05 u[IU]/mL (ref 0.450–4.500)

## 2018-08-19 LAB — T4, FREE: Free T4: 1.55 ng/dL (ref 0.82–1.77)

## 2018-08-21 DIAGNOSIS — I809 Phlebitis and thrombophlebitis of unspecified site: Secondary | ICD-10-CM | POA: Diagnosis not present

## 2018-08-21 DIAGNOSIS — I872 Venous insufficiency (chronic) (peripheral): Secondary | ICD-10-CM | POA: Diagnosis not present

## 2018-08-21 DIAGNOSIS — I8312 Varicose veins of left lower extremity with inflammation: Secondary | ICD-10-CM | POA: Diagnosis not present

## 2018-09-06 ENCOUNTER — Telehealth: Payer: Self-pay

## 2018-09-06 DIAGNOSIS — I8393 Asymptomatic varicose veins of bilateral lower extremities: Secondary | ICD-10-CM | POA: Diagnosis not present

## 2018-09-06 DIAGNOSIS — L820 Inflamed seborrheic keratosis: Secondary | ICD-10-CM | POA: Diagnosis not present

## 2018-09-06 DIAGNOSIS — D2230 Melanocytic nevi of unspecified part of face: Secondary | ICD-10-CM | POA: Diagnosis not present

## 2018-09-06 DIAGNOSIS — Z1283 Encounter for screening for malignant neoplasm of skin: Secondary | ICD-10-CM | POA: Diagnosis not present

## 2018-09-06 DIAGNOSIS — L578 Other skin changes due to chronic exposure to nonionizing radiation: Secondary | ICD-10-CM | POA: Diagnosis not present

## 2018-09-06 DIAGNOSIS — Z85820 Personal history of malignant melanoma of skin: Secondary | ICD-10-CM | POA: Diagnosis not present

## 2018-09-06 DIAGNOSIS — L821 Other seborrheic keratosis: Secondary | ICD-10-CM | POA: Diagnosis not present

## 2018-09-06 DIAGNOSIS — D225 Melanocytic nevi of trunk: Secondary | ICD-10-CM | POA: Diagnosis not present

## 2018-09-06 DIAGNOSIS — D1800 Hemangioma unspecified site: Secondary | ICD-10-CM | POA: Diagnosis not present

## 2018-09-06 DIAGNOSIS — D229 Melanocytic nevi, unspecified: Secondary | ICD-10-CM | POA: Diagnosis not present

## 2018-09-06 DIAGNOSIS — I8290 Acute embolism and thrombosis of unspecified vein: Secondary | ICD-10-CM | POA: Diagnosis not present

## 2018-09-06 DIAGNOSIS — L812 Freckles: Secondary | ICD-10-CM | POA: Diagnosis not present

## 2018-09-06 NOTE — Telephone Encounter (Signed)
Pt called that she went to the dentist he said she had infection as per dr Humphrey Rolls advised pt find out what kind of infection or abscess and cal [place] back for treatment or ask dentist for antibiotic

## 2018-09-09 ENCOUNTER — Other Ambulatory Visit: Payer: Self-pay | Admitting: Internal Medicine

## 2018-09-09 DIAGNOSIS — J3089 Other allergic rhinitis: Secondary | ICD-10-CM

## 2018-09-18 ENCOUNTER — Encounter: Payer: Self-pay | Admitting: Adult Health

## 2018-09-18 ENCOUNTER — Ambulatory Visit (INDEPENDENT_AMBULATORY_CARE_PROVIDER_SITE_OTHER): Payer: PPO | Admitting: Adult Health

## 2018-09-18 VITALS — BP 116/65 | HR 75 | Temp 96.8°F | Resp 16 | Ht 63.0 in | Wt 154.0 lb

## 2018-09-18 DIAGNOSIS — I10 Essential (primary) hypertension: Secondary | ICD-10-CM | POA: Diagnosis not present

## 2018-09-18 DIAGNOSIS — B001 Herpesviral vesicular dermatitis: Secondary | ICD-10-CM

## 2018-09-18 DIAGNOSIS — J0110 Acute frontal sinusitis, unspecified: Secondary | ICD-10-CM | POA: Diagnosis not present

## 2018-09-18 MED ORDER — SULFAMETHOXAZOLE-TRIMETHOPRIM 400-80 MG PO TABS: 1.0000 | ORAL_TABLET | Freq: Two times a day (BID) | ORAL | 0 refills | Status: AC

## 2018-09-18 MED ORDER — VALACYCLOVIR HCL 1 G PO TABS: 1000.0000 mg | ORAL_TABLET | Freq: Two times a day (BID) | ORAL | 1 refills | Status: DC

## 2018-09-18 NOTE — Progress Notes (Signed)
[facility] [place], [place] [address]  Internal MEDICINE  Office Visit Note  Patient Name: Marissa Wilson  [id number]  [id number]  Date of Service: 10/09/2018  Chief Complaint  Patient presents with  . Sinusitis  . Cough     HPI Pt is here for a sick visit.  Patient reports sinus pain and pressure as well as cough, postnasal drip and a mild sore throat for 3 days.  She denies any fever or chills.  She also reports cold sore underneath her left nostril.  She is resting refill on her Valtrex at this time.     Current Medication:  Outpatient Encounter Medications as of 09/18/2018  Medication Sig  . acetaminophen (TYLENOL) 650 MG CR tablet Take 650 mg every 6 (six) hours by mouth.  . Cholecalciferol 1000 units capsule Take 1,000 Units daily by mouth.  . montelukast (SINGULAIR) 10 MG tablet TAKE 1 TABLET BY MOUTH EVERYDAY AT BEDTIME  . omeprazole (PRILOSEC) 40 MG capsule Take 1 capsule (40 mg total) by mouth daily.  . rivaroxaban (XARELTO) 10 MG TABS tablet Take 10 mg by mouth at bedtime.  . triamterene-hydrochlorothiazide (DYAZIDE) 37.5-25 MG capsule TAKE 1 CAPSULE BY MOUTH IN THE MORNING FOR FLUID  . [DISCONTINUED] levothyroxine (SYNTHROID, LEVOTHROID) 75 MCG tablet TAKE 1 TABLET BY MOUTH EVERY DAY  . [EXPIRED] sulfamethoxazole-trimethoprim (BACTRIM,SEPTRA) 400-80 MG tablet Take 1 tablet by mouth 2 (two) times daily for 10 days.  . valACYclovir (VALTREX) 1000 MG tablet Take 1 tablet (1,000 mg total) by mouth 2 (two) times daily.   No facility-administered encounter medications on file as of 09/18/2018.       Medical History: Past Medical History:  Diagnosis Date  . Asthma   . Gastrointestinal disorder   . GERD (gastroesophageal reflux disease)   . Hx of melanoma excision   . Hyperlipidemia, unspecified   . Hypertension   . Hypothyroid   . Osteoarthritis   . Sleep apnea   . Thyroid disease   . UTI (urinary tract infection)      Vital  Signs: BP 116/65   Pulse 75   Temp (!) 96.8 F (36 C)   Resp 16   Ht 5\' 3"  (1.6 m)   Wt 154 lb (69.9 kg)   SpO2 94%   BMI 27.28 kg/m    Review of Systems  Constitutional: Negative for chills, fatigue and unexpected weight change.  HENT: Positive for congestion, postnasal drip, rhinorrhea, sinus pressure, sinus pain and sore throat. Negative for sneezing.   Eyes: Negative for photophobia, pain and redness.  Respiratory: Positive for cough. Negative for chest tightness and shortness of breath.   Cardiovascular: Negative for chest pain and palpitations.  Gastrointestinal: Negative for abdominal pain, constipation, diarrhea, nausea and vomiting.  Endocrine: Negative.   Genitourinary: Negative for dysuria and frequency.  Musculoskeletal: Negative for arthralgias, back pain, joint swelling and neck pain.  Skin: Negative for rash.  Allergic/Immunologic: Negative.   Neurological: Negative for tremors and numbness.  Hematological: Negative for adenopathy. Does not bruise/bleed easily.  Psychiatric/Behavioral: Negative for behavioral problems and sleep disturbance. The patient is not nervous/anxious.     Physical Exam Vitals signs and nursing note reviewed.  Constitutional:      General: She is not in acute distress.    Appearance: She is well-developed. She is not diaphoretic.  HENT:     Head: Normocephalic and atraumatic.     Mouth/Throat:     Pharynx: No oropharyngeal exudate.  Eyes:  Pupils: Pupils are equal, round, and reactive to light.  Neck:     Musculoskeletal: Normal range of motion and neck supple.     Thyroid: No thyromegaly.     Vascular: No JVD.     Trachea: No tracheal deviation.  Cardiovascular:     Rate and Rhythm: Normal rate and regular rhythm.     Heart sounds: Normal heart sounds. No murmur. No friction rub. No gallop.   Pulmonary:     Effort: Pulmonary effort is normal. No respiratory distress.     Breath sounds: Normal breath sounds. No wheezing or  rales.  Chest:     Chest wall: No tenderness.  Abdominal:     Palpations: Abdomen is soft.     Tenderness: There is no abdominal tenderness. There is no guarding.  Musculoskeletal: Normal range of motion.  Lymphadenopathy:     Cervical: No cervical adenopathy.  Skin:    General: Skin is warm and dry.  Neurological:     Mental Status: She is alert and oriented to person, place, and time.     Cranial Nerves: No cranial nerve deficit.  Psychiatric:        Behavior: Behavior normal.        Thought Content: Thought content normal.        Judgment: Judgment normal.    Assessment/Plan: 1. Acute non-recurrent frontal sinusitis Patient prescribed a course of Bactrim.  Take medication until completed.  Return to clinic in 7 to 10 days symptoms fail to improve. - sulfamethoxazole-trimethoprim (BACTRIM,SEPTRA) 400-80 MG tablet; Take 1 tablet by mouth 2 (two) times daily for 10 days.  Dispense: 20 tablet; Refill: 0  2. Cold sore Prescription for Valtrex sent for patient.  Instructed patient on use.  3. Essential hypertension Stable, continue current medications as prescribed.  General Counseling: tykira wachs understanding of the findings of todays visit and agrees with plan of treatment. I have discussed any further diagnostic evaluation that may be needed or ordered today. We also reviewed her medications today. she has been encouraged to call the office with any questions or concerns that should arise related to todays visit.   No orders of the defined types were placed in this encounter.   Meds ordered this encounter  Medications  . sulfamethoxazole-trimethoprim (BACTRIM,SEPTRA) 400-80 MG tablet    Sig: Take 1 tablet by mouth 2 (two) times daily for 10 days.    Dispense:  20 tablet    Refill:  0  . valACYclovir (VALTREX) 1000 MG tablet    Sig: Take 1 tablet (1,000 mg total) by mouth 2 (two) times daily.    Dispense:  20 tablet    Refill:  1    Time spent: 25  Minutes  This patient was seen by Orson Gear AGNP-C in Collaboration with Dr Lavera Guise as a part of collaborative care agreement.  Kendell Bane AGNP-C Internal Medicine

## 2018-10-02 DIAGNOSIS — M5136 Other intervertebral disc degeneration, lumbar region: Secondary | ICD-10-CM | POA: Diagnosis not present

## 2018-10-02 DIAGNOSIS — M9905 Segmental and somatic dysfunction of pelvic region: Secondary | ICD-10-CM | POA: Diagnosis not present

## 2018-10-02 DIAGNOSIS — M9903 Segmental and somatic dysfunction of lumbar region: Secondary | ICD-10-CM | POA: Diagnosis not present

## 2018-10-02 DIAGNOSIS — M62830 Muscle spasm of back: Secondary | ICD-10-CM | POA: Diagnosis not present

## 2018-10-03 ENCOUNTER — Other Ambulatory Visit: Payer: Self-pay | Admitting: Adult Health

## 2018-10-03 DIAGNOSIS — M62830 Muscle spasm of back: Secondary | ICD-10-CM | POA: Diagnosis not present

## 2018-10-03 DIAGNOSIS — M9903 Segmental and somatic dysfunction of lumbar region: Secondary | ICD-10-CM | POA: Diagnosis not present

## 2018-10-03 DIAGNOSIS — M5136 Other intervertebral disc degeneration, lumbar region: Secondary | ICD-10-CM | POA: Diagnosis not present

## 2018-10-03 DIAGNOSIS — M9905 Segmental and somatic dysfunction of pelvic region: Secondary | ICD-10-CM | POA: Diagnosis not present

## 2018-10-04 ENCOUNTER — Encounter: Payer: Self-pay | Admitting: Adult Health

## 2018-10-04 ENCOUNTER — Ambulatory Visit (INDEPENDENT_AMBULATORY_CARE_PROVIDER_SITE_OTHER): Payer: PPO | Admitting: Adult Health

## 2018-10-04 VITALS — BP 114/68 | HR 63 | Resp 16 | Ht 63.0 in | Wt 153.6 lb

## 2018-10-04 DIAGNOSIS — I10 Essential (primary) hypertension: Secondary | ICD-10-CM

## 2018-10-04 DIAGNOSIS — M5442 Lumbago with sciatica, left side: Secondary | ICD-10-CM | POA: Diagnosis not present

## 2018-10-04 DIAGNOSIS — I824Y9 Acute embolism and thrombosis of unspecified deep veins of unspecified proximal lower extremity: Secondary | ICD-10-CM | POA: Diagnosis not present

## 2018-10-04 DIAGNOSIS — M62830 Muscle spasm of back: Secondary | ICD-10-CM | POA: Diagnosis not present

## 2018-10-04 DIAGNOSIS — M9905 Segmental and somatic dysfunction of pelvic region: Secondary | ICD-10-CM | POA: Diagnosis not present

## 2018-10-04 DIAGNOSIS — M9903 Segmental and somatic dysfunction of lumbar region: Secondary | ICD-10-CM | POA: Diagnosis not present

## 2018-10-04 DIAGNOSIS — M5136 Other intervertebral disc degeneration, lumbar region: Secondary | ICD-10-CM | POA: Diagnosis not present

## 2018-10-04 MED ORDER — TRAMADOL HCL 50 MG PO TABS: 50.0000 mg | ORAL_TABLET | Freq: Four times a day (QID) | ORAL | 0 refills | Status: DC | PRN

## 2018-10-04 MED ORDER — PREDNISONE 10 MG PO TABS: ORAL_TABLET | ORAL | 0 refills | Status: DC

## 2018-10-04 NOTE — Progress Notes (Signed)
[facility] [place], [place] [address]  Internal MEDICINE  Office Visit Note  Patient Name: Marissa Wilson  [id number]  [id number]  Date of Service: 10/04/2018  Chief Complaint  Patient presents with  . Pain    back pain since saturday, has been taking tylenol and it is not working, pt is currently seeing a chiropractor     HPI Pt is here for a sick visit. Pt reports 4 days ago she was reaching down to dust her door, and began to have low back pain when she came back up. She is in the care of a chiropractor, Dr. Eliezer Wilson.  Dr. Eliezer Wilson is uncomfortable prescribing medications for the patient due to her being on Xarelto.  She is here to day saying that she needs pain medicine.  She has not slept in 3 days.  Pain reports she has some oxycodone left from a previous injury last year but she has been afraid to take them without speaking to [place] first.   Current Medication:  Outpatient Encounter Medications as of 10/04/2018  Medication Sig  . acetaminophen (TYLENOL) 650 MG CR tablet Take 650 mg every 6 (six) hours by mouth.  . Cholecalciferol 1000 units capsule Take 1,000 Units daily by mouth.  . levothyroxine (SYNTHROID, LEVOTHROID) 75 MCG tablet TAKE 1 TABLET BY MOUTH EVERY DAY  . montelukast (SINGULAIR) 10 MG tablet TAKE 1 TABLET BY MOUTH EVERYDAY AT BEDTIME  . omeprazole (PRILOSEC) 40 MG capsule Take 1 capsule (40 mg total) by mouth daily.  . rivaroxaban (XARELTO) 10 MG TABS tablet Take 10 mg by mouth at bedtime.  . triamterene-hydrochlorothiazide (DYAZIDE) 37.5-25 MG capsule TAKE 1 CAPSULE BY MOUTH IN THE MORNING FOR FLUID  . valACYclovir (VALTREX) 1000 MG tablet Take 1 tablet (1,000 mg total) by mouth 2 (two) times daily.  . vitamin k 100 MCG tablet Take 100 mcg by mouth daily.  . predniSONE (DELTASONE) 10 MG tablet Use per dose pack  . traMADol (ULTRAM) 50 MG tablet Take 1 tablet (50 mg total) by mouth every 6 (six) hours as needed.   No  facility-administered encounter medications on file as of 10/04/2018.       Medical History: Past Medical History:  Diagnosis Date  . Asthma   . Gastrointestinal disorder   . GERD (gastroesophageal reflux disease)   . Hx of melanoma excision   . Hyperlipidemia, unspecified   . Hypertension   . Hypothyroid   . Osteoarthritis   . Sleep apnea   . Thyroid disease   . UTI (urinary tract infection)      Vital Signs: BP 114/68 (BP Location: Left Arm, Patient Position: Sitting, Cuff Size: Normal)   Pulse 63   Resp 16   Ht 5\' 3"  (1.6 m)   Wt 153 lb 9.6 oz (69.7 kg)   SpO2 98%   BMI 27.21 kg/m    Review of Systems  Constitutional: Negative for chills, fatigue and unexpected weight change.  HENT: Negative for congestion, rhinorrhea, sneezing and sore throat.   Eyes: Negative for photophobia, pain and redness.  Respiratory: Negative for cough, chest tightness and shortness of breath.   Cardiovascular: Negative for chest pain and palpitations.  Gastrointestinal: Negative for abdominal pain, constipation, diarrhea, nausea and vomiting.  Endocrine: Negative.   Genitourinary: Negative for dysuria and frequency.  Musculoskeletal: Negative for arthralgias, back pain, joint swelling and neck pain.  Skin: Negative for rash.  Allergic/Immunologic: Negative.   Neurological: Negative for tremors and numbness.  Hematological:  Negative for adenopathy. Does not bruise/bleed easily.  Psychiatric/Behavioral: Negative for behavioral problems and sleep disturbance. The patient is not nervous/anxious.     Physical Exam Vitals signs and nursing note reviewed.  Constitutional:      General: She is not in acute distress.    Appearance: She is well-developed. She is not diaphoretic.  HENT:     Head: Normocephalic and atraumatic.     Mouth/Throat:     Pharynx: No oropharyngeal exudate.  Eyes:     Pupils: Pupils are equal, round, and reactive to light.  Neck:     Musculoskeletal: Normal range  of motion and neck supple.     Thyroid: No thyromegaly.     Vascular: No JVD.     Trachea: No tracheal deviation.  Cardiovascular:     Rate and Rhythm: Normal rate and regular rhythm.     Heart sounds: Normal heart sounds. No murmur. No friction rub. No gallop.   Pulmonary:     Effort: Pulmonary effort is normal. No respiratory distress.     Breath sounds: Normal breath sounds. No wheezing or rales.  Chest:     Chest wall: No tenderness.  Abdominal:     Palpations: Abdomen is soft.     Tenderness: There is no abdominal tenderness. There is no guarding.  Musculoskeletal: Normal range of motion.  Lymphadenopathy:     Cervical: No cervical adenopathy.  Skin:    General: Skin is warm and dry.  Neurological:     Mental Status: She is alert and oriented to person, place, and time.     Cranial Nerves: No cranial nerve deficit.  Psychiatric:        Behavior: Behavior normal.        Thought Content: Thought content normal.        Judgment: Judgment normal.    Assessment/Plan: 1. Acute midline low back pain with left-sided sciatica Encourage patient to take prednisone taper and use Ultram as needed during the day for intense back pain.  Also encourage patient to use oxycodone if she has trouble sleeping at night due to pain.  She will continue to see the chiropractor to try to realign her misaligned this. - predniSONE (DELTASONE) 10 MG tablet; Use per dose pack  Dispense: 21 tablet; Refill: 0 - traMADol (ULTRAM) 50 MG tablet; Take 1 tablet (50 mg total) by mouth every 6 (six) hours as needed.  Dispense: 30 tablet; Refill: 0  2. Essential hypertension Stable, continue current medications as prescribed.  3. Acute deep vein thrombosis (DVT) of proximal vein of lower extremity, unspecified laterality ([place]) Stable,Patient mains on Xarelto at this time.  General Counseling: Marissa Wilson understanding of the findings of todays visit and agrees with plan of treatment. I have discussed any  further diagnostic evaluation that may be needed or ordered today. We also reviewed her medications today. she has been encouraged to call the office with any questions or concerns that should arise related to todays visit.   No orders of the defined types were placed in this encounter.   Meds ordered this encounter  Medications  . predniSONE (DELTASONE) 10 MG tablet    Sig: Use per dose pack    Dispense:  21 tablet    Refill:  0  . traMADol (ULTRAM) 50 MG tablet    Sig: Take 1 tablet (50 mg total) by mouth every 6 (six) hours as needed.    Dispense:  30 tablet    Refill:  0  Time spent: 25 Minutes  This patient was seen by Orson Gear AGNP-C in Collaboration with Dr Lavera Guise as a part of collaborative care agreement.  Kendell Bane AGNP-C Internal Medicine

## 2018-10-04 NOTE — Patient Instructions (Signed)
Acute Back Pain, Adult  Acute back pain is sudden and usually short-lived. It is often caused by an injury to the muscles and tissues in the back. The injury may result from:   A muscle or ligament getting overstretched or torn (strained). Ligaments are tissues that connect bones to each other. Lifting something improperly can cause a back strain.   Wear and tear (degeneration) of the spinal disks. Spinal disks are circular tissue that provides cushioning between the bones of the spine (vertebrae).   Twisting motions, such as while playing sports or doing yard work.   A hit to the back.   Arthritis.  You may have a physical exam, lab tests, and imaging tests to find the cause of your pain. Acute back pain usually goes away with rest and home care.  Follow these instructions at home:  Managing pain, stiffness, and swelling   Take over-the-counter and prescription medicines only as told by your health care provider.   Your health care provider may recommend applying ice during the first 24-48 hours after your pain starts. To do this:  ? Put ice in a plastic bag.  ? Place a towel between your skin and the bag.  ? Leave the ice on for 20 minutes, 2-3 times a day.   If directed, apply heat to the affected area as often as told by your health care provider. Use the heat source that your health care provider recommends, such as a moist heat pack or a heating pad.  ? Place a towel between your skin and the heat source.  ? Leave the heat on for 20-30 minutes.  ? Remove the heat if your skin turns bright red. This is especially important if you are unable to feel pain, heat, or cold. You have a greater risk of getting burned.  Activity     Do not stay in bed. Staying in bed for more than 1-2 days can delay your recovery.   Sit up and stand up straight. Avoid leaning forward when you sit, or hunching over when you stand.  ? If you work at a desk, sit close to it so you do not need to lean over. Keep your chin tucked  in. Keep your neck drawn back, and keep your elbows bent at a right angle. Your arms should look like the letter "L."  ? Sit high and close to the steering wheel when you drive. Add lower back (lumbar) support to your car seat, if needed.   Take short walks on even surfaces as soon as you are able. Try to increase the length of time you walk each day.   Do not sit, drive, or stand in one place for more than 30 minutes at a time. Sitting or standing for long periods of time can put stress on your back.   Do not drive or use heavy machinery while taking prescription pain medicine.   Use proper lifting techniques. When you bend and lift, use positions that put less stress on your back:  ? Bend your knees.  ? Keep the load close to your body.  ? Avoid twisting.   Exercise regularly as told by your health care provider. Exercising helps your back heal faster and helps prevent back injuries by keeping muscles strong and flexible.   Work with a physical therapist to make a safe exercise program, as recommended by your health care provider. Do any exercises as told by your physical therapist.  Lifestyle   Maintain   a healthy weight. Extra weight puts stress on your back and makes it difficult to have good posture.   Avoid activities or situations that make you feel anxious or stressed. Stress and anxiety increase muscle tension and can make back pain worse. Learn ways to manage anxiety and stress, such as through exercise.  General instructions   Sleep on a firm mattress in a comfortable position. Try lying on your side with your knees slightly bent. If you lie on your back, put a pillow under your knees.   Follow your treatment plan as told by your health care provider. This may include:  ? Cognitive or behavioral therapy.  ? Acupuncture or massage therapy.  ? Meditation or yoga.  Contact a health care provider if:   You have pain that is not relieved with rest or medicine.   You have increasing pain going down  into your legs or buttocks.   Your pain does not improve after 2 weeks.   You have pain at night.   You lose weight without trying.   You have a fever or chills.  Get help right away if:   You develop new bowel or bladder control problems.   You have unusual weakness or numbness in your arms or legs.   You develop nausea or vomiting.   You develop abdominal pain.   You feel faint.  Summary   Acute back pain is sudden and usually short-lived.   Use proper lifting techniques. When you bend and lift, use positions that put less stress on your back.   Take over-the-counter and prescription medicines and apply heat or ice as directed by your health care provider.  This information is not intended to replace advice given to you by your health care provider. Make sure you discuss any questions you have with your health care provider.  Document Released: 08/02/2005 Document Revised: 03/09/2018 Document Reviewed: 03/16/2017  Elsevier Interactive Patient Education  2019 [employer]vier Inc[employer]

## 2018-10-05 DIAGNOSIS — M5136 Other intervertebral disc degeneration, lumbar region: Secondary | ICD-10-CM | POA: Diagnosis not present

## 2018-10-05 DIAGNOSIS — M62830 Muscle spasm of back: Secondary | ICD-10-CM | POA: Diagnosis not present

## 2018-10-05 DIAGNOSIS — M9903 Segmental and somatic dysfunction of lumbar region: Secondary | ICD-10-CM | POA: Diagnosis not present

## 2018-10-05 DIAGNOSIS — M9905 Segmental and somatic dysfunction of pelvic region: Secondary | ICD-10-CM | POA: Diagnosis not present

## 2018-10-06 DIAGNOSIS — M9903 Segmental and somatic dysfunction of lumbar region: Secondary | ICD-10-CM | POA: Diagnosis not present

## 2018-10-06 DIAGNOSIS — M9905 Segmental and somatic dysfunction of pelvic region: Secondary | ICD-10-CM | POA: Diagnosis not present

## 2018-10-06 DIAGNOSIS — M62830 Muscle spasm of back: Secondary | ICD-10-CM | POA: Diagnosis not present

## 2018-10-06 DIAGNOSIS — M5136 Other intervertebral disc degeneration, lumbar region: Secondary | ICD-10-CM | POA: Diagnosis not present

## 2018-10-09 ENCOUNTER — Encounter: Payer: Self-pay | Admitting: Adult Health

## 2018-10-09 ENCOUNTER — Other Ambulatory Visit: Payer: Self-pay | Admitting: Internal Medicine

## 2018-10-09 DIAGNOSIS — M5136 Other intervertebral disc degeneration, lumbar region: Secondary | ICD-10-CM | POA: Diagnosis not present

## 2018-10-09 DIAGNOSIS — M62830 Muscle spasm of back: Secondary | ICD-10-CM | POA: Diagnosis not present

## 2018-10-09 DIAGNOSIS — M9903 Segmental and somatic dysfunction of lumbar region: Secondary | ICD-10-CM | POA: Diagnosis not present

## 2018-10-09 DIAGNOSIS — M9905 Segmental and somatic dysfunction of pelvic region: Secondary | ICD-10-CM | POA: Diagnosis not present

## 2018-10-11 ENCOUNTER — Ambulatory Visit (INDEPENDENT_AMBULATORY_CARE_PROVIDER_SITE_OTHER): Payer: PPO | Admitting: Adult Health

## 2018-10-11 ENCOUNTER — Encounter: Payer: Self-pay | Admitting: Adult Health

## 2018-10-11 VITALS — BP 110/76 | HR 82 | Temp 97.5°F | Resp 16 | Ht 63.0 in | Wt 151.0 lb

## 2018-10-11 DIAGNOSIS — R300 Dysuria: Secondary | ICD-10-CM

## 2018-10-11 DIAGNOSIS — M9905 Segmental and somatic dysfunction of pelvic region: Secondary | ICD-10-CM | POA: Diagnosis not present

## 2018-10-11 DIAGNOSIS — M62830 Muscle spasm of back: Secondary | ICD-10-CM | POA: Diagnosis not present

## 2018-10-11 DIAGNOSIS — I10 Essential (primary) hypertension: Secondary | ICD-10-CM | POA: Diagnosis not present

## 2018-10-11 DIAGNOSIS — N3000 Acute cystitis without hematuria: Secondary | ICD-10-CM

## 2018-10-11 DIAGNOSIS — M9903 Segmental and somatic dysfunction of lumbar region: Secondary | ICD-10-CM | POA: Diagnosis not present

## 2018-10-11 DIAGNOSIS — M5136 Other intervertebral disc degeneration, lumbar region: Secondary | ICD-10-CM | POA: Diagnosis not present

## 2018-10-11 MED ORDER — CIPROFLOXACIN HCL 500 MG PO TABS: 500.0000 mg | ORAL_TABLET | Freq: Two times a day (BID) | ORAL | 0 refills | Status: DC

## 2018-10-11 NOTE — Progress Notes (Signed)
[facility] [place], [place] [address]  Internal MEDICINE  Office Visit Note  Patient Name: Marissa Wilson  [id number]  [id number]  Date of Service: 10/30/2018  Chief Complaint  Patient presents with  . Urinary Tract Infection    lower back pain      HPI Pt is here for a sick visit. Pt reports urinary frequency, and burning for 1.5 days.  She reports feeling bloated, and heavy in her bladder area.  She is also reporting low back pain, however she has been dealing with a lower back injury.     Current Medication:  Outpatient Encounter Medications as of 10/11/2018  Medication Sig  . acetaminophen (TYLENOL) 650 MG CR tablet Take 650 mg every 6 (six) hours by mouth.  . Cholecalciferol 1000 units capsule Take 1,000 Units daily by mouth.  . levothyroxine (SYNTHROID, LEVOTHROID) 75 MCG tablet TAKE 1 TABLET BY MOUTH EVERY DAY  . montelukast (SINGULAIR) 10 MG tablet TAKE 1 TABLET BY MOUTH EVERYDAY AT BEDTIME  . omeprazole (PRILOSEC) 40 MG capsule Take 1 capsule (40 mg total) by mouth daily.  . rivaroxaban (XARELTO) 10 MG TABS tablet Take 10 mg by mouth at bedtime.  . traMADol (ULTRAM) 50 MG tablet Take 1 tablet (50 mg total) by mouth every 6 (six) hours as needed.  . triamterene-hydrochlorothiazide (DYAZIDE) 37.5-25 MG capsule TAKE 1 CAPSULE BY MOUTH IN THE MORNING FOR FLUID  . valACYclovir (VALTREX) 1000 MG tablet Take 1 tablet (1,000 mg total) by mouth 2 (two) times daily.  . vitamin k 100 MCG tablet Take 100 mcg by mouth daily.  . ciprofloxacin (CIPRO) 500 MG tablet Take 1 tablet (500 mg total) by mouth 2 (two) times daily. (Patient not taking: Reported on 10/26/2018)  . [DISCONTINUED] predniSONE (DELTASONE) 10 MG tablet Use per dose pack (Patient not taking: Reported on 10/11/2018)   No facility-administered encounter medications on file as of 10/11/2018.       Medical History: Past Medical History:  Diagnosis Date  . Asthma   . Gastrointestinal  disorder   . GERD (gastroesophageal reflux disease)   . Hx of melanoma excision   . Hyperlipidemia, unspecified   . Hypertension   . Hypothyroid   . Osteoarthritis   . Sleep apnea   . Thyroid disease   . UTI (urinary tract infection)      Vital Signs: BP 110/76   Pulse 82   Temp (!) 97.5 F (36.4 C) (Oral)   Resp 16   Ht 5\' 3"  (1.6 m)   Wt 151 lb (68.5 kg)   SpO2 95%   BMI 26.75 kg/m    Review of Systems  Constitutional: Negative for chills, fatigue and unexpected weight change.  HENT: Negative for congestion, rhinorrhea, sneezing and sore throat.   Eyes: Negative for photophobia, pain and redness.  Respiratory: Negative for cough, chest tightness and shortness of breath.   Cardiovascular: Negative for chest pain and palpitations.  Gastrointestinal: Negative for abdominal pain, constipation, diarrhea, nausea and vomiting.  Endocrine: Negative.   Genitourinary: Positive for dysuria and frequency.  Musculoskeletal: Negative for arthralgias, back pain, joint swelling and neck pain.  Skin: Negative for rash.  Allergic/Immunologic: Negative.   Neurological: Negative for tremors and numbness.  Hematological: Negative for adenopathy. Does not bruise/bleed easily.  Psychiatric/Behavioral: Negative for behavioral problems and sleep disturbance. The patient is not nervous/anxious.     Physical Exam Vitals signs and nursing note reviewed.  Constitutional:      General: She  is not in acute distress.    Appearance: She is well-developed. She is not diaphoretic.  HENT:     Head: Normocephalic and atraumatic.     Mouth/Throat:     Pharynx: No oropharyngeal exudate.  Eyes:     Pupils: Pupils are equal, round, and reactive to light.  Neck:     Musculoskeletal: Normal range of motion and neck supple.     Thyroid: No thyromegaly.     Vascular: No JVD.     Trachea: No tracheal deviation.  Cardiovascular:     Rate and Rhythm: Normal rate and regular rhythm.     Heart sounds:  Normal heart sounds. No murmur. No friction rub. No gallop.   Pulmonary:     Effort: Pulmonary effort is normal. No respiratory distress.     Breath sounds: Normal breath sounds. No wheezing or rales.  Chest:     Chest wall: No tenderness.  Abdominal:     Palpations: Abdomen is soft.     Tenderness: There is no abdominal tenderness. There is no guarding.  Musculoskeletal: Normal range of motion.  Lymphadenopathy:     Cervical: No cervical adenopathy.  Skin:    General: Skin is warm and dry.  Neurological:     Mental Status: She is alert and oriented to person, place, and time.     Cranial Nerves: No cranial nerve deficit.  Psychiatric:        Behavior: Behavior normal.        Thought Content: Thought content normal.        Judgment: Judgment normal.    Assessment/Plan: 1. Acute cystitis without hematuria Pt will take Cipro as directed.  Follow up in clinic if symptoms fail to improve in 7-10 days.  - ciprofloxacin (CIPRO) 500 MG tablet; Take 1 tablet (500 mg total) by mouth 2 (two) times daily. (Patient not taking: Reported on 10/26/2018)  Dispense: 20 tablet; Refill: 0  2. Essential hypertension Stable, continue current therapy.  3. Dysuria Trace leukocytes, positive nitrates, - UA/M w/rflx Culture, Routine - Microscopic Examination - Urine Culture, Reflex  General Counseling: [name] verbalizes understanding of the findings of todays visit and agrees with plan of treatment. I have discussed any further diagnostic evaluation that may be needed or ordered today. We also reviewed her medications today. she has been encouraged to call the office with any questions or concerns that should arise related to todays visit.   Orders Placed This Encounter  Procedures  . Microscopic Examination  . Urine Culture, Reflex  . UA/M w/rflx Culture, Routine    Meds ordered this encounter  Medications  . ciprofloxacin (CIPRO) 500 MG tablet    Sig: Take 1 tablet (500 mg total) by mouth 2  (two) times daily.    Dispense:  20 tablet    Refill:  0    Time spent: 25 Minutes  This patient was seen by Orson Gear AGNP-C in Collaboration with Dr Lavera Guise as a part of collaborative care agreement.  Kendell Bane AGNP-C Internal Medicine

## 2018-10-12 DIAGNOSIS — M5136 Other intervertebral disc degeneration, lumbar region: Secondary | ICD-10-CM | POA: Diagnosis not present

## 2018-10-12 DIAGNOSIS — M9903 Segmental and somatic dysfunction of lumbar region: Secondary | ICD-10-CM | POA: Diagnosis not present

## 2018-10-12 DIAGNOSIS — M9905 Segmental and somatic dysfunction of pelvic region: Secondary | ICD-10-CM | POA: Diagnosis not present

## 2018-10-12 DIAGNOSIS — M62830 Muscle spasm of back: Secondary | ICD-10-CM | POA: Diagnosis not present

## 2018-10-16 DIAGNOSIS — M9905 Segmental and somatic dysfunction of pelvic region: Secondary | ICD-10-CM | POA: Diagnosis not present

## 2018-10-16 DIAGNOSIS — M9903 Segmental and somatic dysfunction of lumbar region: Secondary | ICD-10-CM | POA: Diagnosis not present

## 2018-10-16 DIAGNOSIS — M5136 Other intervertebral disc degeneration, lumbar region: Secondary | ICD-10-CM | POA: Diagnosis not present

## 2018-10-16 DIAGNOSIS — M62830 Muscle spasm of back: Secondary | ICD-10-CM | POA: Diagnosis not present

## 2018-10-16 LAB — UA/M W/RFLX CULTURE, ROUTINE
Bilirubin, UA: NEGATIVE
Glucose, UA: NEGATIVE
Ketones, UA: NEGATIVE
Nitrite, UA: POSITIVE — AB
Protein, UA: NEGATIVE
RBC, UA: NEGATIVE
Specific Gravity, UA: 1.02 (ref 1.005–1.030)
Urobilinogen, Ur: 1 mg/dL (ref 0.2–1.0)
pH, UA: 8 — ABNORMAL HIGH (ref 5.0–7.5)

## 2018-10-16 LAB — MICROSCOPIC EXAMINATION: RBC MICROSCOPIC, UA: NONE SEEN /HPF (ref 0–2)

## 2018-10-16 LAB — URINE CULTURE, REFLEX

## 2018-10-18 DIAGNOSIS — M5136 Other intervertebral disc degeneration, lumbar region: Secondary | ICD-10-CM | POA: Diagnosis not present

## 2018-10-18 DIAGNOSIS — M9905 Segmental and somatic dysfunction of pelvic region: Secondary | ICD-10-CM | POA: Diagnosis not present

## 2018-10-18 DIAGNOSIS — M62830 Muscle spasm of back: Secondary | ICD-10-CM | POA: Diagnosis not present

## 2018-10-18 DIAGNOSIS — M9903 Segmental and somatic dysfunction of lumbar region: Secondary | ICD-10-CM | POA: Diagnosis not present

## 2018-10-23 DIAGNOSIS — I82890 Acute embolism and thrombosis of other specified veins: Secondary | ICD-10-CM | POA: Diagnosis not present

## 2018-10-26 ENCOUNTER — Ambulatory Visit (INDEPENDENT_AMBULATORY_CARE_PROVIDER_SITE_OTHER): Payer: PPO | Admitting: Adult Health

## 2018-10-26 ENCOUNTER — Encounter: Payer: Self-pay | Admitting: Adult Health

## 2018-10-26 VITALS — BP 112/70 | HR 83 | Temp 98.1°F | Resp 16 | Ht 63.0 in | Wt 150.0 lb

## 2018-10-26 DIAGNOSIS — M5442 Lumbago with sciatica, left side: Secondary | ICD-10-CM

## 2018-10-26 DIAGNOSIS — I10 Essential (primary) hypertension: Secondary | ICD-10-CM | POA: Diagnosis not present

## 2018-10-26 DIAGNOSIS — R197 Diarrhea, unspecified: Secondary | ICD-10-CM

## 2018-10-26 DIAGNOSIS — R300 Dysuria: Secondary | ICD-10-CM | POA: Diagnosis not present

## 2018-10-26 LAB — POCT URINALYSIS DIPSTICK
BILIRUBIN UA: NEGATIVE
Blood, UA: NEGATIVE
Glucose, UA: NEGATIVE
Ketones, UA: NEGATIVE
Leukocytes, UA: NEGATIVE
Nitrite, UA: NEGATIVE
Protein, UA: NEGATIVE
Spec Grav, UA: 1.01 (ref 1.010–1.025)
Urobilinogen, UA: 0.2 E.U./dL
pH, UA: 7 (ref 5.0–8.0)

## 2018-10-26 NOTE — Patient Instructions (Signed)
Viral Gastroenteritis, Adult    Viral gastroenteritis is also known as the stomach flu. This condition is caused by certain germs (viruses). These germs can be passed from person to person very easily (are very contagious). This condition can cause sudden watery poop (diarrhea), fever, and throwing up (vomiting).  Having watery poop and throwing up can make you feel weak and cause you to get dehydrated. Dehydration can make you tired and thirsty, make you have a dry mouth, and make it so you pee (urinate) less often. Older adults and people with other diseases or a weak defense system (immune system) are at higher risk for dehydration. It is important to replace the fluids that you lose from having watery poop and throwing up.  Follow these instructions at home:  Follow instructions from your doctor about how to care for yourself at home.  Eating and drinking  Follow these instructions as told by your doctor:   Take an oral rehydration solution (ORS). This is a drink that is sold at pharmacies and stores.   Drink clear fluids in small amounts as you are able, such as:  ? Water.  ? Ice chips.  ? Diluted fruit juice.  ? Low-calorie sports drinks.   Eat bland, easy-to-digest foods in small amounts as you are able, such as:  ? Bananas.  ? Applesauce.  ? Rice.  ? Low-fat (lean) meats.  ? Toast.  ? Crackers.   Avoid fluids that have a lot of sugar or caffeine in them.   Avoid alcohol.   Avoid spicy or fatty foods.  General instructions     Drink enough fluid to keep your pee (urine) clear or pale yellow.   Wash your hands often. If you cannot use soap and water, use hand sanitizer.   Make sure that all people in your home wash their hands well and often.   Rest at home while you get better.   Take over-the-counter and prescription medicines only as told by your doctor.   Watch your condition for any changes.   Take a warm bath to help with any burning or pain from having watery poop.   Keep all follow-up  visits as told by your doctor. This is important.  Contact a doctor if:   You cannot keep fluids down.   Your symptoms get worse.   You have new symptoms.   You feel light-headed or dizzy.   You have muscle cramps.  Get help right away if:   You have chest pain.   You feel very weak or you pass out (faint).   You see blood in your throw-up.   Your throw-up looks like coffee grounds.   You have bloody or black poop (stools) or poop that look like tar.   You have a very bad headache, a stiff neck, or both.   You have a rash.   You have very bad pain, cramping, or bloating in your belly (abdomen).   You have trouble breathing.   You are breathing very quickly.   Your heart is beating very quickly.   Your skin feels cold and clammy.   You feel confused.   You have pain when you pee.   You have signs of dehydration, such as:  ? Dark pee, hardly any pee, or no pee.  ? Cracked lips.  ? Dry mouth.  ? Sunken eyes.  ? Sleepiness.  ? Weakness.  This information is not intended to replace advice given to you by your   health care provider. Make sure you discuss any questions you have with your health care provider.  Document Released: 01/19/2008 Document Revised: 04/26/2018 Document Reviewed: 04/08/2015  Elsevier Interactive Patient Education  2019 [employer]vier Inc.

## 2018-10-26 NOTE — Progress Notes (Signed)
[facility] [place], [place] [address]  Internal MEDICINE  Office Visit Note  Patient Name: Marissa Wilson  [id number]  [id number]  Date of Service: 10/26/2018  Chief Complaint  Patient presents with  . Abdominal Pain    been on cipro for ten days  . Diarrhea     HPI Pt is here for a sick visit. Pt is here reporting diarrhea since last night.  She reports 3 episodes of diarrhea.  She had C diff infection 23 years ago. She would like to be tested for C-diff. She describes the abdominal pain as left lower quadrant discomfort.  She states "It doesn't feel right."      Current Medication:  Outpatient Encounter Medications as of 10/26/2018  Medication Sig  . acetaminophen (TYLENOL) 650 MG CR tablet Take 650 mg every 6 (six) hours by mouth.  . Cholecalciferol 1000 units capsule Take 1,000 Units daily by mouth.  . levothyroxine (SYNTHROID, LEVOTHROID) 75 MCG tablet TAKE 1 TABLET BY MOUTH EVERY DAY  . montelukast (SINGULAIR) 10 MG tablet TAKE 1 TABLET BY MOUTH EVERYDAY AT BEDTIME  . omeprazole (PRILOSEC) 40 MG capsule Take 1 capsule (40 mg total) by mouth daily.  . rivaroxaban (XARELTO) 10 MG TABS tablet Take 10 mg by mouth at bedtime.  . traMADol (ULTRAM) 50 MG tablet Take 1 tablet (50 mg total) by mouth every 6 (six) hours as needed.  . triamterene-hydrochlorothiazide (DYAZIDE) 37.5-25 MG capsule TAKE 1 CAPSULE BY MOUTH IN THE MORNING FOR FLUID  . valACYclovir (VALTREX) 1000 MG tablet Take 1 tablet (1,000 mg total) by mouth 2 (two) times daily.  . vitamin k 100 MCG tablet Take 100 mcg by mouth daily.  . ciprofloxacin (CIPRO) 500 MG tablet Take 1 tablet (500 mg total) by mouth 2 (two) times daily. (Patient not taking: Reported on 10/26/2018)   No facility-administered encounter medications on file as of 10/26/2018.       Medical History: Past Medical History:  Diagnosis Date  . Asthma   . Gastrointestinal disorder   . GERD (gastroesophageal reflux  disease)   . Hx of melanoma excision   . Hyperlipidemia, unspecified   . Hypertension   . Hypothyroid   . Osteoarthritis   . Sleep apnea   . Thyroid disease   . UTI (urinary tract infection)      Vital Signs: BP 112/70   Pulse 83   Temp 98.1 F (36.7 C)   Resp 16   Ht 5\' 3"  (1.6 m)   Wt 150 lb (68 kg)   SpO2 97%   BMI 26.57 kg/m    Review of Systems  Constitutional: Negative for chills, fatigue and unexpected weight change.  HENT: Negative for congestion, rhinorrhea, sneezing and sore throat.   Eyes: Negative for photophobia, pain and redness.  Respiratory: Negative for cough, chest tightness and shortness of breath.   Cardiovascular: Negative for chest pain and palpitations.  Gastrointestinal: Positive for diarrhea. Negative for abdominal pain, constipation, nausea and vomiting.  Endocrine: Negative.   Genitourinary: Negative for dysuria and frequency.  Musculoskeletal: Negative for arthralgias, back pain, joint swelling and neck pain.  Skin: Negative for rash.  Allergic/Immunologic: Negative.   Neurological: Negative for tremors and numbness.  Hematological: Negative for adenopathy. Does not bruise/bleed easily.  Psychiatric/Behavioral: Negative for behavioral problems and sleep disturbance. The patient is not nervous/anxious.     Physical Exam Vitals signs and nursing note reviewed.  Constitutional:      General: She is not  in acute distress.    Appearance: She is well-developed. She is not diaphoretic.  HENT:     Head: Normocephalic and atraumatic.     Mouth/Throat:     Pharynx: No oropharyngeal exudate.  Eyes:     Pupils: Pupils are equal, round, and reactive to light.  Neck:     Musculoskeletal: Normal range of motion and neck supple.     Thyroid: No thyromegaly.     Vascular: No JVD.     Trachea: No tracheal deviation.  Cardiovascular:     Rate and Rhythm: Normal rate and regular rhythm.     Heart sounds: Normal heart sounds. No murmur. No friction  rub. No gallop.   Pulmonary:     Effort: Pulmonary effort is normal. No respiratory distress.     Breath sounds: Normal breath sounds. No wheezing or rales.  Chest:     Chest wall: No tenderness.  Abdominal:     Palpations: Abdomen is soft.     Tenderness: There is no abdominal tenderness. There is no guarding.  Musculoskeletal: Normal range of motion.  Lymphadenopathy:     Cervical: No cervical adenopathy.  Skin:    General: Skin is warm and dry.  Neurological:     Mental Status: She is alert and oriented to person, place, and time.     Cranial Nerves: No cranial nerve deficit.  Psychiatric:        Behavior: Behavior normal.        Thought Content: Thought content normal.        Judgment: Judgment normal.    Assessment/Plan: 1. Diarrhea, unspecified type Patient will have stool testing for ova and parasite as well as C. difficile toxin.  Will review results when they are available.  2. Acute midline low back pain with left-sided sciatica Resolving at this time.  Patient continues to report pain however she states is much better than it has been.  Continue present management.  3. Essential hypertension Stable, BP today 112/70.  Continue current therapy.  4. Dysuria Urine clear today. - POCT Urinalysis Dipstick  General Counseling: Jalisha verbalizes understanding of the findings of todays visit and agrees with plan of treatment. I have discussed any further diagnostic evaluation that may be needed or ordered today. We also reviewed her medications today. she has been encouraged to call the office with any questions or concerns that should arise related to todays visit.   Orders Placed This Encounter  Procedures  . POCT Urinalysis Dipstick    No orders of the defined types were placed in this encounter.   Time spent: 25 Minutes  This patient was seen by Orson Gear AGNP-C in Collaboration with Dr Lavera Guise as a part of collaborative care agreement.  Kendell Bane AGNP-C Internal Medicine

## 2018-10-30 ENCOUNTER — Other Ambulatory Visit: Payer: Self-pay | Admitting: Adult Health

## 2018-10-30 DIAGNOSIS — R197 Diarrhea, unspecified: Secondary | ICD-10-CM | POA: Diagnosis not present

## 2018-11-03 LAB — STOOL CULTURE: E COLI SHIGA TOXIN ASSAY: NEGATIVE

## 2018-11-03 LAB — OVA AND PARASITE EXAMINATION

## 2018-11-03 LAB — CLOSTRIDIUM DIFFICILE EIA: C difficile Toxins A+B, EIA: NEGATIVE

## 2018-11-07 ENCOUNTER — Telehealth: Payer: Self-pay | Admitting: Adult Health

## 2018-11-07 NOTE — Telephone Encounter (Signed)
Informed patient of culture results and she states that she is doing better .

## 2018-11-07 NOTE — Telephone Encounter (Signed)
-----   Message from Kendell Bane, NP sent at 11/07/2018 11:21 AM EDT ----- Stool culture negative, how is she doing?

## 2018-11-08 ENCOUNTER — Telehealth: Payer: Self-pay

## 2018-11-08 NOTE — Telephone Encounter (Signed)
Pt advised no growth in urine and thyroid ok

## 2018-11-15 ENCOUNTER — Telehealth: Payer: Self-pay | Admitting: *Deleted

## 2018-11-15 NOTE — Telephone Encounter (Signed)
Left VM to return call regarding symptoms for possible UTI-need to triage further. Appt scheduled for 4/14@ 1:30pm

## 2018-11-16 NOTE — Telephone Encounter (Signed)
Spoke with patient appointment rescheduled

## 2018-11-28 ENCOUNTER — Ambulatory Visit: Payer: PPO | Admitting: Urology

## 2018-12-11 ENCOUNTER — Other Ambulatory Visit: Payer: Self-pay | Admitting: Internal Medicine

## 2018-12-11 DIAGNOSIS — Z1231 Encounter for screening mammogram for malignant neoplasm of breast: Secondary | ICD-10-CM

## 2018-12-30 ENCOUNTER — Other Ambulatory Visit: Payer: Self-pay | Admitting: Internal Medicine

## 2018-12-30 DIAGNOSIS — J3089 Other allergic rhinitis: Secondary | ICD-10-CM

## 2019-01-10 ENCOUNTER — Other Ambulatory Visit: Payer: Self-pay

## 2019-01-10 ENCOUNTER — Ambulatory Visit: Payer: PPO | Admitting: Urology

## 2019-01-16 ENCOUNTER — Encounter: Payer: Self-pay | Admitting: Urology

## 2019-01-16 ENCOUNTER — Other Ambulatory Visit: Payer: Self-pay

## 2019-01-16 ENCOUNTER — Ambulatory Visit (INDEPENDENT_AMBULATORY_CARE_PROVIDER_SITE_OTHER): Payer: PPO | Admitting: Urology

## 2019-01-16 VITALS — BP 137/74 | HR 83 | Ht 63.0 in | Wt 153.0 lb

## 2019-01-16 DIAGNOSIS — N898 Other specified noninflammatory disorders of vagina: Secondary | ICD-10-CM

## 2019-01-16 DIAGNOSIS — N390 Urinary tract infection, site not specified: Secondary | ICD-10-CM | POA: Diagnosis not present

## 2019-01-16 MED ORDER — ESTROGENS, CONJUGATED 0.625 MG/GM VA CREA: 1.0000 | TOPICAL_CREAM | Freq: Every day | VAGINAL | 12 refills | Status: DC

## 2019-01-16 NOTE — Progress Notes (Signed)
01/16/2019 8:42 AM   Marissa Wilson 1946-07-10 [id number]  Referring provider: Lavera Guise, MD [address] [place], [place] [address]  Chief Complaint  Patient presents with   Urinary Tract Infection    Follow up    HPI: 73 year old female with a personal history of UTIs who returns today.  She last seen and evaluated by me in 2017 at which time she was having approximately 1-2 UTIs per year.  At that time, she just started topical compounded estrogen cream.  See this previous note for details.   She has only had about one UTI per year.  She reports that she primarily presents today to reestablish care in case she has another infection that needs to be treated.  She stopped using compounded estrogen cream quite some time ago.  She reports that her infections are so infrequent, she does not feel that she needs this.  She does mention today that she has fairly significant vaginal dryness.  She is interested in lubricants that are less likely to cause infections.  Her husband has been experimenting with PDE 5 inhibitors and are rekindling their sexual relationship.  Today, she denies any urinary tract symptoms.  No incontinence, urgency, frequency, dysuria, or any other concerns   PMH: Past Medical History:  Diagnosis Date   Asthma    Gastrointestinal disorder    GERD (gastroesophageal reflux disease)    Hx of melanoma excision    Hyperlipidemia, unspecified    Hypertension    Hypothyroid    Osteoarthritis    Sleep apnea    Thyroid disease    UTI (urinary tract infection)     Surgical History: Past Surgical History:  Procedure Laterality Date   ABDOMINAL HYSTERECTOMY     PR ENDOVENOUS LASTER, 1ST VEIN Right 06/09/2017   Procedure: EVLA of Right GSV; Surgeon: Lauretta Grill, MD; Location: MAIN [facility]; Service: Vascular   PR [place] AT SEPH-FEM JUNC Right 06/09/2017   Procedure: LIGATION & DIVISION OF LONG SAPHENOUS  VEIN AT SAPHENOFEMORAL JUNCTION, OR DISTAL INTERRUPTIONS; Surgeon: Lauretta Grill, MD; Location: MAIN OR [place]; Service: Vascular   PR PHLEB VEINS EXTREM TO 20 Right 06/09/2017   Procedure: STAB PHLEBECTOMY OF VARICOSE VEINS, 1 EXTREMITY: 10-20 STAB INCISIONS; Surgeon: Lauretta Grill, MD; Location: MAIN OR [facility]; Service: Vascular    Home Medications:  Allergies as of 01/16/2019      Reactions   Amoxicillin    Ampicillin Diarrhea   CDC   Cefazolin Diarrhea   Other reaction(s): Other (comments) cdc CDC   Nsaids    Penicillins Diarrhea   Other reaction(s): Other (comments)  CDC CDC CDC      Medication List       Accurate as of January 16, 2019  8:42 AM. If you have any questions, ask your nurse or doctor.        acetaminophen 650 MG CR tablet Commonly known as:  TYLENOL Take 650 mg every 6 (six) hours by mouth.   Cholecalciferol 25 MCG (1000 UT) capsule Take 1,000 Units daily by mouth.   ciprofloxacin 500 MG tablet Commonly known as:  Cipro Take 1 tablet (500 mg total) by mouth 2 (two) times daily.   levothyroxine 75 MCG tablet Commonly known as:  SYNTHROID TAKE 1 TABLET BY MOUTH EVERY DAY   montelukast 10 MG tablet Commonly known as:  SINGULAIR TAKE 1 TABLET BY MOUTH EVERYDAY AT BEDTIME   omeprazole 40 MG capsule Commonly known as:  PRILOSEC  Take 1 capsule (40 mg total) by mouth daily.   traMADol 50 MG tablet Commonly known as:  ULTRAM Take 1 tablet (50 mg total) by mouth every 6 (six) hours as needed.   triamterene-hydrochlorothiazide 37.5-25 MG capsule Commonly known as:  DYAZIDE TAKE 1 CAPSULE BY MOUTH IN THE MORNING FOR FLUID   valACYclovir 1000 MG tablet Commonly known as:  VALTREX Take 1 tablet (1,000 mg total) by mouth 2 (two) times daily.   vitamin k 100 MCG tablet Take 100 mcg by mouth daily.   Xarelto 10 MG Tabs tablet Generic drug:  rivaroxaban Take 10 mg by mouth at bedtime.       Allergies:  Allergies  Allergen  Reactions   Amoxicillin    Ampicillin Diarrhea    CDC   Cefazolin Diarrhea    Other reaction(s): Other (comments) cdc CDC   Nsaids    Penicillins Diarrhea    Other reaction(s): Other (comments)  CDC CDC CDC     Family History: Family History  Problem Relation Age of Onset   Breast cancer Mother 58   Varicose Veins Mother    Lung cancer Father    Bladder Cancer Neg Hx    Kidney cancer Neg Hx     Social History:  reports that she has never smoked. She has never used smokeless tobacco. She reports current alcohol use. She reports that she does not use drugs.  ROS: UROLOGY Frequent Urination?: No Hard to postpone urination?: No Burning/pain with urination?: No Get up at night to urinate?: No Leakage of urine?: No Urine stream starts and stops?: No Trouble starting stream?: No Do you have to strain to urinate?: No Blood in urine?: No Urinary tract infection?: No Sexually transmitted disease?: No Injury to kidneys or bladder?: No Painful intercourse?: No Weak stream?: No Currently pregnant?: No Vaginal bleeding?: No Last menstrual period?: n  Gastrointestinal Nausea?: No Vomiting?: No Indigestion/heartburn?: No Diarrhea?: No Constipation?: No  Constitutional Fever: No Night sweats?: No Weight loss?: No Fatigue?: No  Skin Skin rash/lesions?: No Itching?: No  Eyes Blurred vision?: No Double vision?: No  Ears/Nose/Throat Sore throat?: No Sinus problems?: No  Hematologic/Lymphatic Swollen glands?: No Easy bruising?: No  Cardiovascular Leg swelling?: No Chest pain?: No  Respiratory Cough?: No Shortness of breath?: No  Endocrine Excessive thirst?: No  Musculoskeletal Back pain?: No Joint pain?: No  Neurological Headaches?: No Dizziness?: No  Psychologic Depression?: No Anxiety?: No  Physical Exam: BP 137/74    Pulse 83    Ht 5\' 3"  (1.6 m)    Wt 153 lb (69.4 kg)    BMI 27.10 kg/m   Constitutional:  Alert and  oriented, No acute distress. HEENT: [place] AT, moist mucus membranes.  Trachea midline, no masses. Cardiovascular: No clubbing, cyanosis, or edema. Respiratory: Normal respiratory effort, no increased work of breathing. Skin: No rashes, bruises or suspicious lesions. Neurologic: Grossly intact, no focal deficits, moving all 4 extremities. Psychiatric: Normal mood and affect.  Laboratory Data: Lab Results  Component Value Date   WBC 5.4 08/05/2017   HGB 14.4 08/05/2017   HCT 39.4 08/05/2017   MCV 90 08/05/2017   PLT 231 08/05/2017    Lab Results  Component Value Date   CREATININE 0.68 08/18/2018    Lab Results  Component Value Date   HGBA1C 5.2 01/25/2018    Urinalysis n/a  Pertinent Imaging: N/a  Assessment & Plan:    1. Recurrent UTI Infrequent urinary tract infections Advised that if and when she has  other symptoms of urinary tract infection, she may present for same-day urine drop of in our office as needed Consider resuming probiotics, cranberry tablets and topical estrogen cream if frequency of infections increase  2. Vaginal dryness Discussed water-soluble lubricants today  In addition to this, discussed post menopausal vaginal dryness which again may be improved with the resumption of topical estrogen cream  As per previous discussion, she has some concerns about systemic absorption however the risk is quite low, risk and benefits discussed in detail.  She like to resume this medication.  We have no samples to give her today but the prescription was called into the pharmacy.  Discussed that if the prescription for Premarin is too expensive, we can consider sending the prescription to compound pharmacy which can be cheaper.   F/u prn  Hollice Espy, MD  [facility] [address], [place], [place] [address] [phone]

## 2019-01-31 ENCOUNTER — Ambulatory Visit: Payer: PPO | Admitting: Nurse Practitioner

## 2019-02-07 ENCOUNTER — Other Ambulatory Visit: Payer: Self-pay | Admitting: Internal Medicine

## 2019-02-08 ENCOUNTER — Other Ambulatory Visit: Payer: Self-pay

## 2019-02-08 ENCOUNTER — Ambulatory Visit (INDEPENDENT_AMBULATORY_CARE_PROVIDER_SITE_OTHER): Payer: PPO | Admitting: Adult Health

## 2019-02-08 ENCOUNTER — Encounter: Payer: Self-pay | Admitting: Adult Health

## 2019-02-08 DIAGNOSIS — I10 Essential (primary) hypertension: Secondary | ICD-10-CM

## 2019-02-08 DIAGNOSIS — E782 Mixed hyperlipidemia: Secondary | ICD-10-CM | POA: Diagnosis not present

## 2019-02-08 DIAGNOSIS — K219 Gastro-esophageal reflux disease without esophagitis: Secondary | ICD-10-CM

## 2019-02-08 DIAGNOSIS — H00011 Hordeolum externum right upper eyelid: Secondary | ICD-10-CM | POA: Diagnosis not present

## 2019-02-08 MED ORDER — OMEPRAZOLE 40 MG PO CPDR: DELAYED_RELEASE_CAPSULE | ORAL | 1 refills | Status: DC

## 2019-02-08 MED ORDER — BACITRACIN-POLYMYXIN B 500-10000 UNIT/GM OP OINT: 1.0000 "application " | TOPICAL_OINTMENT | Freq: Two times a day (BID) | OPHTHALMIC | 0 refills | Status: DC

## 2019-02-08 NOTE — Progress Notes (Signed)
[facility] [place], [place] [address] [address]  Internal MEDICINE  Telephone Visit  Patient Name: Marissa Wilson  [id number]  [id number]  Date of Service: 02/08/2019  I connected with the patient at 1650 by telephone and verified the patients identity using two identifiers.   I discussed the limitations, risks, security and privacy concerns of performing an evaluation and management service by telephone and the availability of in person appointments. I also discussed with the patient that there may be a patient responsible charge related to the service.  The patient expressed understanding and agrees to proceed.    Chief Complaint  Patient presents with  . Telephone Screen  . Telephone Assessment  . Hypothyroidism  . Gastroesophageal Reflux  . Stye    corner of upper lid right , have used warm compress  . Hypertension    HPI  PT seen via video for follow up on GERD, hypothyroid   Current Medication: Outpatient Encounter Medications as of 02/08/2019  Medication Sig  . acetaminophen (TYLENOL) 650 MG CR tablet Take 650 mg every 6 (six) hours by mouth.  . Cholecalciferol 1000 units capsule Take 1,000 Units daily by mouth.  . conjugated estrogens (PREMARIN) vaginal cream Place 1 Applicatorful vaginally daily. Use pea sized amount M-W-Fr before bedtime  . levothyroxine (SYNTHROID, LEVOTHROID) 75 MCG tablet TAKE 1 TABLET BY MOUTH EVERY DAY  . montelukast (SINGULAIR) 10 MG tablet TAKE 1 TABLET BY MOUTH EVERYDAY AT BEDTIME  . omeprazole (PRILOSEC) 40 MG capsule TAKE 1 CAPSULE BY MOUTH EVERY DAY  . rivaroxaban (XARELTO) 10 MG TABS tablet Take 10 mg by mouth at bedtime.  . triamterene-hydrochlorothiazide (DYAZIDE) 37.5-25 MG capsule TAKE 1 CAPSULE BY MOUTH IN THE MORNING FOR FLUID  . valACYclovir (VALTREX) 1000 MG tablet Take 1 tablet (1,000 mg total) by mouth 2 (two) times daily.  . vitamin k 100 MCG tablet Take 100 mcg by mouth daily.  . [DISCONTINUED] traMADol  (ULTRAM) 50 MG tablet Take 1 tablet (50 mg total) by mouth every 6 (six) hours as needed. (Patient not taking: Reported on 02/08/2019)   No facility-administered encounter medications on file as of 02/08/2019.     Surgical History: Past Surgical History:  Procedure Laterality Date  . ABDOMINAL HYSTERECTOMY    . PR ENDOVENOUS LASTER, 1ST VEIN Right 06/09/2017   Procedure: EVLA of Right GSV; Surgeon: Lauretta Grill, MD; Location: MAIN OR [facility]; Service: Vascular  . PR LIGATN LONG SAPHENOUS VEIN AT SEPH-FEM JUNC Right 06/09/2017   Procedure: LIGATION & DIVISION OF LONG SAPHENOUS VEIN AT SAPHENOFEMORAL JUNCTION, OR DISTAL INTERRUPTIONS; Surgeon: Lauretta Grill, MD; Location[facility]; Service: Vascular  . PR PHLEB VEINS EXTREM TO 20 Right 06/09/2017   Procedure: STAB PHLEBECTOMY OF VARICOSE VEINS, 1 EXTREMITY: 10-20 STAB INCISIONS; Surgeon: Lauretta Grill, MD; Location[facility]; Service: Vascular    Medical History: Past Medical History:  Diagnosis Date  . Asthma   . Gastrointestinal disorder   . GERD (gastroesophageal reflux disease)   . Hx of melanoma excision   . Hyperlipidemia, unspecified   . Hypertension   . Hypothyroid   . Osteoarthritis   . Sleep apnea   . Thyroid disease   . UTI (urinary tract infection)     Family History: Family History  Problem Relation Age of Onset  . Breast cancer Mother 60  . Varicose Veins Mother   . Lung cancer Father   . Bladder Cancer Neg Hx   . Kidney cancer Neg  Hx     Social History   Socioeconomic History  . Marital status: Married    Spouse name: Nada Boozer  . Number of children: 2  . Years of education: 62  . Highest education level: Some college, no degree  Occupational History  . Not on file  Social Needs  . Financial resource strain: Not on file  . Food insecurity    Worry: Not on file    Inability: Not on file  . Transportation needs    Medical: Not on file    Non-medical: Not on file   Tobacco Use  . Smoking status: Never Smoker  . Smokeless tobacco: Never Used  Substance and Sexual Activity  . Alcohol use: Yes    Comment: occasional wine  . Drug use: No  . Sexual activity: Not on file  Lifestyle  . Physical activity    Days per week: Not on file    Minutes per session: Not on file  . Stress: Not on file  Relationships  . Social Herbalist on phone: Not on file    Gets together: Not on file    Attends religious service: Not on file    Active member of club or organization: Not on file    Attends meetings of clubs or organizations: Not on file    Relationship status: Not on file  . Intimate partner violence    Fear of current or ex partner: Not on file    Emotionally abused: Not on file    Physically abused: Not on file    Forced sexual activity: Not on file  Other Topics Concern  . Not on file  Social History Narrative  . Not on file      Review of Systems  Constitutional: Negative for chills, fatigue and unexpected weight change.  HENT: Negative for congestion, rhinorrhea, sneezing and sore throat.   Eyes: Positive for pain. Negative for photophobia and redness.  Respiratory: Negative for cough, chest tightness and shortness of breath.   Cardiovascular: Negative for chest pain and palpitations.  Gastrointestinal: Negative for abdominal pain, constipation, diarrhea, nausea and vomiting.  Endocrine: Negative.   Genitourinary: Negative for dysuria and frequency.  Musculoskeletal: Negative for arthralgias, back pain, joint swelling and neck pain.  Skin: Negative for rash.  Allergic/Immunologic: Negative.   Neurological: Negative for tremors and numbness.  Hematological: Negative for adenopathy. Does not bruise/bleed easily.  Psychiatric/Behavioral: Negative for behavioral problems and sleep disturbance. The patient is not nervous/anxious.     Vital Signs: There were no vitals taken for this visit.   Observation/Objective: Well  appearing, nad noted.  Speaking in full sentences.   Assessment/Plan: 1. Essential hypertension Stable, continue present management.   2. Gastroesophageal reflux disease without esophagitis Refilled patients omeprazole.  Continue to use medication as discussed.   3. Hordeolum externum of right upper eyelid Continue warm compress, use bacitracin ointment as discussed. Return to clinic if symptoms do not improve, or see eye doctor as discussed.   4. Mixed hyperlipidemia Stable, continue to follow. Continue current medication  General Counseling: hazely sealey understanding of the findings of today's phone visit and agrees with plan of treatment. I have discussed any further diagnostic evaluation that may be needed or ordered today. We also reviewed her medications today. she has been encouraged to call the office with any questions or concerns that should arise related to todays visit.    No orders of the defined types were placed in this encounter.  No orders of the defined types were placed in this encounter.   Time spent:12 Minutes    Orson Gear AGNP-C Internal medicine

## 2019-02-14 ENCOUNTER — Ambulatory Visit
Admission: RE | Admit: 2019-02-14 | Discharge: 2019-02-14 | Disposition: A | Discharge status: Home / Self Care | Payer: PPO | Source: Ambulatory Visit | Attending: Internal Medicine | Admitting: Internal Medicine

## 2019-02-14 ENCOUNTER — Other Ambulatory Visit: Payer: Self-pay

## 2019-02-14 DIAGNOSIS — Z1231 Encounter for screening mammogram for malignant neoplasm of breast: Secondary | ICD-10-CM | POA: Insufficient documentation

## 2019-03-29 DIAGNOSIS — I8010 Phlebitis and thrombophlebitis of unspecified femoral vein: Secondary | ICD-10-CM | POA: Diagnosis not present

## 2019-04-07 ENCOUNTER — Other Ambulatory Visit: Payer: Self-pay | Admitting: Internal Medicine

## 2019-04-28 ENCOUNTER — Encounter: Payer: Self-pay | Admitting: Internal Medicine

## 2019-05-09 ENCOUNTER — Other Ambulatory Visit: Payer: Self-pay | Admitting: Internal Medicine

## 2019-06-28 ENCOUNTER — Encounter: Payer: Self-pay | Admitting: Internal Medicine

## 2019-07-31 ENCOUNTER — Other Ambulatory Visit: Payer: Self-pay | Admitting: Internal Medicine

## 2019-08-23 ENCOUNTER — Telehealth: Payer: Self-pay

## 2019-08-23 NOTE — Telephone Encounter (Signed)
LMOM FOR PATIENT TO CONFIRM AND SCREEN FOR OV 08-28-19.

## 2019-08-28 ENCOUNTER — Other Ambulatory Visit: Payer: Self-pay

## 2019-08-28 ENCOUNTER — Ambulatory Visit (INDEPENDENT_AMBULATORY_CARE_PROVIDER_SITE_OTHER): Payer: PPO | Admitting: Internal Medicine

## 2019-08-28 ENCOUNTER — Encounter: Payer: Self-pay | Admitting: Internal Medicine

## 2019-08-28 DIAGNOSIS — R300 Dysuria: Secondary | ICD-10-CM

## 2019-08-28 DIAGNOSIS — Z1231 Encounter for screening mammogram for malignant neoplasm of breast: Secondary | ICD-10-CM

## 2019-08-28 DIAGNOSIS — E782 Mixed hyperlipidemia: Secondary | ICD-10-CM | POA: Diagnosis not present

## 2019-08-28 DIAGNOSIS — Z0001 Encounter for general adult medical examination with abnormal findings: Secondary | ICD-10-CM

## 2019-08-28 DIAGNOSIS — I10 Essential (primary) hypertension: Secondary | ICD-10-CM | POA: Diagnosis not present

## 2019-08-28 DIAGNOSIS — R0602 Shortness of breath: Secondary | ICD-10-CM | POA: Diagnosis not present

## 2019-08-28 DIAGNOSIS — E039 Hypothyroidism, unspecified: Secondary | ICD-10-CM | POA: Diagnosis not present

## 2019-08-28 DIAGNOSIS — G4700 Insomnia, unspecified: Secondary | ICD-10-CM | POA: Diagnosis not present

## 2019-08-28 DIAGNOSIS — K219 Gastro-esophageal reflux disease without esophagitis: Secondary | ICD-10-CM | POA: Diagnosis not present

## 2019-08-28 NOTE — Progress Notes (Signed)
[facility] [place], [place] [address]  Internal MEDICINE  Office Visit Note  Patient Name: Marissa Wilson  [id number]  [id number]  Date of Service: 08/28/2019  Chief Complaint  Patient presents with  . Medicare Wellness  . Hyperlipidemia  . Hypertension  . Gastroesophageal Reflux  . Shortness of Breath    upon movement, only happens sometimes more recently   . Foot Problem    top of feet feel like they are burning believes it is due to veins     HPI Pt is here for routine health maintenance examination. She is feeling well, wears mask and has been socially distant. She is c/o occasional sob, no cough or wheezing, slight chest pressure. Pt also has varicose veins, thrombophlebitis and DVT( chronic anticoagulation). She did have surgical correction for one leg and will have the other leg done once the pandemic slows down. Cannot tolerate a statin due to severe elevation in her transaminases.    Current Medication: Outpatient Encounter Medications as of 08/28/2019  Medication Sig  . acetaminophen (TYLENOL) 650 MG CR tablet Take 650 mg every 6 (six) hours by mouth.  . Ascorbic Acid (VITAMIN C PO) Take 500 mg by mouth daily.  . Cholecalciferol 1000 units capsule Take 1,000 Units daily by mouth.  . conjugated estrogens (PREMARIN) vaginal cream Place 1 Applicatorful vaginally daily. Use pea sized amount M-W-Fr before bedtime  . levothyroxine (SYNTHROID) 75 MCG tablet TAKE 1 TABLET BY MOUTH EVERY DAY  . montelukast (SINGULAIR) 10 MG tablet TAKE 1 TABLET BY MOUTH EVERYDAY AT BEDTIME  . omeprazole (PRILOSEC) 40 MG capsule TAKE 1 CAPSULE BY MOUTH EVERY DAY  . rivaroxaban (XARELTO) 10 MG TABS tablet Take 10 mg by mouth at bedtime.  . triamterene-hydrochlorothiazide (DYAZIDE) 37.5-25 MG capsule TAKE 1 CAPSULE BY MOUTH IN THE MORNING FOR FLUID  . vitamin k 100 MCG tablet Take 100 mcg by mouth daily.  . [DISCONTINUED] bacitracin-polymyxin b (POLYSPORIN) ophthalmic  ointment Place 1 application into the right eye every 12 (twelve) hours. apply to eye every 12 hours while awake (Patient not taking: Reported on 08/28/2019)  . [DISCONTINUED] valACYclovir (VALTREX) 1000 MG tablet Take 1 tablet (1,000 mg total) by mouth 2 (two) times daily. (Patient not taking: Reported on 08/28/2019)   No facility-administered encounter medications on file as of 08/28/2019.    Surgical History: Past Surgical History:  Procedure Laterality Date  . ABDOMINAL HYSTERECTOMY    . PR ENDOVENOUS LASTER, 1ST VEIN Right 06/09/2017   Procedure: EVLA of Right GSV; Surgeon: Lauretta Grill, MD; Location: MAIN OR [facility]; Service: Vascular  . PR LIGATN LONG SAPHENOUS VEIN AT SEPH-FEM JUNC Right 06/09/2017   Procedure: LIGATION & DIVISION OF LONG SAPHENOUS VEIN AT SAPHENOFEMORAL JUNCTION, OR DISTAL INTERRUPTIONS; Surgeon: Lauretta Grill, MD; Location: MAIN OR [facility]; Service: Vascular  . PR PHLEB VEINS EXTREM TO 20 Right 06/09/2017   Procedure: STAB PHLEBECTOMY OF VARICOSE VEINS, 1 EXTREMITY: 10-20 STAB INCISIONS; Surgeon: Lauretta Grill, MD; Location: MAIN OR [facility]; Service: Vascular    Medical History: Past Medical History:  Diagnosis Date  . Asthma   . Gastrointestinal disorder   . GERD (gastroesophageal reflux disease)   . Hx of melanoma excision   . Hyperlipidemia, unspecified   . Hypertension   . Hypothyroid   . Osteoarthritis   . Sleep apnea   . Thyroid disease   . UTI (urinary tract infection)     Family History: Family History  Problem Relation Age of Onset  .  Breast cancer Mother 62  . Varicose Veins Mother   . Lung cancer Father   . Bladder Cancer Neg Hx   . Kidney cancer Neg Hx     Review of Systems  Constitutional: Negative for chills, diaphoresis and fatigue.  HENT: Negative for ear pain, postnasal drip and sinus pressure.   Eyes: Negative for photophobia, discharge, redness, itching and visual disturbance.  Respiratory: Positive for  shortness of breath. Negative for cough and wheezing.   Cardiovascular: Negative for chest pain, palpitations and leg swelling.  Gastrointestinal: Negative for abdominal pain, constipation, diarrhea, nausea and vomiting.  Genitourinary: Positive for dysuria. Negative for flank pain.  Musculoskeletal: Negative for arthralgias, back pain, gait problem and neck pain.  Skin: Negative for color change.  Allergic/Immunologic: Negative for environmental allergies and food allergies.  Neurological: Negative for dizziness and headaches.  Hematological: Does not bruise/bleed easily.  Psychiatric/Behavioral: Negative for agitation, behavioral problems (depression) and hallucinations.    Vital Signs: BP 139/85   Pulse 77   Temp 97.6 F (36.4 C)   Resp 16   Ht 5\' 2"  (1.575 m)   Wt 158 lb 6.4 oz (71.8 kg)   SpO2 96%   BMI 28.97 kg/m    Physical Exam Constitutional:      General: She is not in acute distress.    Appearance: She is well-developed. She is not diaphoretic.  HENT:     Head: Normocephalic and atraumatic.     Mouth/Throat:     Pharynx: No oropharyngeal exudate.  Eyes:     Pupils: Pupils are equal, round, and reactive to light.  Neck:     Thyroid: No thyromegaly.     Vascular: No JVD.     Trachea: No tracheal deviation.  Cardiovascular:     Rate and Rhythm: Normal rate and regular rhythm.     Heart sounds: Normal heart sounds. No murmur. No friction rub. No gallop.   Pulmonary:     Effort: Pulmonary effort is normal. No respiratory distress.     Breath sounds: No wheezing or rales.  Chest:     Chest wall: No tenderness.  Abdominal:     General: Bowel sounds are normal.     Palpations: Abdomen is soft.  Musculoskeletal:        General: Normal range of motion.     Cervical back: Normal range of motion and neck supple.  Lymphadenopathy:     Cervical: No cervical adenopathy.  Skin:    General: Skin is warm and dry.  Neurological:     Mental Status: She is alert and  oriented to person, place, and time.     Cranial Nerves: No cranial nerve deficit.  Psychiatric:        Behavior: Behavior normal.        Thought Content: Thought content normal.        Judgment: Judgment normal.    Assessment/Plan: 1. Encounter for general adult medical examination with abnormal findings - Discussed preventive medicine. Ordered all labs, mammogram and BMD  2. Encounter for mammogram to establish baseline mammogram - Mammogram is ordered   3. Gastroesophageal reflux disease without esophagitis - Continue medications, controlled   4. Shortness of breath - Pt is high risk with family h/o CAD and untreated dyslipidemia, will need functional study - Ambulatory referral to Cardiology  5. Essential hypertension - Controlled with hctz/traim   6. Hypothyroidism, unspecified type - Continue Synthroid  - T4, free - TSH  7. Mixed hyperlipidemia - Continue with  life style changes, diet and exercise  - Lipid Panel With LDL/HDL Ratio; Future  - CBC with Differential/Platelet  8. Insomnia, unspecified type - Pt struggles with sleep however is reluctant to try and therapy. Sleep hygiene including OTC therapy discussed   9. Dysuria - UA/M w/rflx Culture, Routine  General Counseling: Ena verbalizes understanding of the findings of todays visit and agrees with plan of treatment. I have discussed any further diagnostic evaluation that may be needed or ordered today. We also reviewed her medications today. she has been encouraged to call the office with any questions or concerns that should arise related to todays visit.  Counseling: Cardiac risk factor modification:  1. Control blood pressure. 2. Exercise as prescribed. 3. Follow low sodium, low fat diet. and low fat and low cholestrol diet. 4. Take ASA 81mg  once a day. 5. Restricted calories diet to lose weight.   Orders Placed This Encounter  Procedures  . DG Bone Density  . UA/M w/rflx Culture, Routine  .  CBC with Differential/Platelet  . Lipid Panel With LDL/HDL Ratio  . TSH  . T4, free  . Comprehensive metabolic panel  . B12 and Folate Panel  . Ferritin  . Ambulatory referral to Cardiology    Total time spent: 30 Minutes  Time spent includes review of chart, medications, test results, and follow up plan with the patient.  Lavera Guise, MD  Internal Medicine

## 2019-08-30 ENCOUNTER — Other Ambulatory Visit: Payer: Self-pay | Admitting: Internal Medicine

## 2019-08-30 DIAGNOSIS — E782 Mixed hyperlipidemia: Secondary | ICD-10-CM | POA: Diagnosis not present

## 2019-08-30 DIAGNOSIS — R451 Restlessness and agitation: Secondary | ICD-10-CM | POA: Diagnosis not present

## 2019-08-30 DIAGNOSIS — K219 Gastro-esophageal reflux disease without esophagitis: Secondary | ICD-10-CM | POA: Diagnosis not present

## 2019-08-30 DIAGNOSIS — I10 Essential (primary) hypertension: Secondary | ICD-10-CM | POA: Diagnosis not present

## 2019-08-30 DIAGNOSIS — Z1231 Encounter for screening mammogram for malignant neoplasm of breast: Secondary | ICD-10-CM

## 2019-08-30 DIAGNOSIS — R0602 Shortness of breath: Secondary | ICD-10-CM | POA: Diagnosis not present

## 2019-08-30 DIAGNOSIS — R300 Dysuria: Secondary | ICD-10-CM | POA: Diagnosis not present

## 2019-08-30 DIAGNOSIS — E039 Hypothyroidism, unspecified: Secondary | ICD-10-CM | POA: Diagnosis not present

## 2019-08-31 ENCOUNTER — Telehealth: Payer: Self-pay

## 2019-08-31 ENCOUNTER — Other Ambulatory Visit: Payer: Self-pay

## 2019-08-31 LAB — TSH: TSH: 1.49 u[IU]/mL (ref 0.450–4.500)

## 2019-08-31 LAB — UA/M W/RFLX CULTURE, ROUTINE
Bilirubin, UA: NEGATIVE
Glucose, UA: NEGATIVE
Ketones, UA: NEGATIVE
Nitrite, UA: NEGATIVE
Protein,UA: NEGATIVE
RBC, UA: NEGATIVE
Specific Gravity, UA: 1.021 (ref 1.005–1.030)
Urobilinogen, Ur: 0.2 mg/dL (ref 0.2–1.0)
pH, UA: 6.5 (ref 5.0–7.5)

## 2019-08-31 LAB — CBC WITH DIFFERENTIAL/PLATELET
Basophils Absolute: 0.1 10*3/uL (ref 0.0–0.2)
Basos: 1 %
EOS (ABSOLUTE): 0.1 10*3/uL (ref 0.0–0.4)
Eos: 1 %
Hematocrit: 44.3 % (ref 34.0–46.6)
Hemoglobin: 15.5 g/dL (ref 11.1–15.9)
Immature Grans (Abs): 0 10*3/uL (ref 0.0–0.1)
Immature Granulocytes: 0 %
Lymphocytes Absolute: 1.7 10*3/uL (ref 0.7–3.1)
Lymphs: 32 %
MCH: 31.5 pg (ref 26.6–33.0)
MCHC: 35 g/dL (ref 31.5–35.7)
MCV: 90 fL (ref 79–97)
Monocytes Absolute: 0.5 10*3/uL (ref 0.1–0.9)
Monocytes: 9 %
Neutrophils Absolute: 2.9 10*3/uL (ref 1.4–7.0)
Neutrophils: 57 %
Platelets: 253 10*3/uL (ref 150–450)
RBC: 4.92 x10E6/uL (ref 3.77–5.28)
RDW: 12.3 % (ref 11.7–15.4)
WBC: 5.1 10*3/uL (ref 3.4–10.8)

## 2019-08-31 LAB — LIPID PANEL WITH LDL/HDL RATIO
Cholesterol, Total: 238 mg/dL — ABNORMAL HIGH (ref 100–199)
HDL: 61 mg/dL (ref >39)
LDL Chol Calc (NIH): 155 mg/dL — ABNORMAL HIGH (ref 0–99)
LDL/HDL Ratio: 2.5 ratio (ref 0.0–3.2)
Triglycerides: 125 mg/dL (ref 0–149)
VLDL Cholesterol Cal: 22 mg/dL (ref 5–40)

## 2019-08-31 LAB — MICROSCOPIC EXAMINATION: Casts: NONE SEEN /lpf

## 2019-08-31 LAB — T4, FREE: Free T4: 1.55 ng/dL (ref 0.82–1.77)

## 2019-08-31 LAB — URINE CULTURE, REFLEX

## 2019-08-31 LAB — FERRITIN: Ferritin: 158 ng/mL — ABNORMAL HIGH (ref 15–150)

## 2019-08-31 MED ORDER — LEVOFLOXACIN 500 MG PO TABS: 500.0000 mg | ORAL_TABLET | Freq: Every day | ORAL | 0 refills | Status: DC

## 2019-08-31 NOTE — Telephone Encounter (Signed)
-----   Message from Lavera Guise, MD sent at 08/31/2019  9:10 AM EST ----- Pending cultures for urine, go ahead and call in Levaquin 500 mg po qd #7

## 2019-08-31 NOTE — Telephone Encounter (Signed)
Called pt and informed  Her that her urine culture is still pending so we went ahead and sent in levaquin 500 mg po qd for 7 days.

## 2019-09-04 ENCOUNTER — Telehealth: Payer: Self-pay

## 2019-09-04 NOTE — Telephone Encounter (Signed)
Called lmom informing patient need to call [facility] to schedule an appointment for bone density and referral for cardiology was sent to [facility]. klh

## 2019-09-05 ENCOUNTER — Ambulatory Visit
Admission: RE | Admit: 2019-09-05 | Discharge: 2019-09-05 | Disposition: A | Discharge status: Home / Self Care | Payer: PPO | Source: Ambulatory Visit | Attending: Internal Medicine | Admitting: Internal Medicine

## 2019-09-05 DIAGNOSIS — I10 Essential (primary) hypertension: Secondary | ICD-10-CM | POA: Insufficient documentation

## 2019-09-05 DIAGNOSIS — Z0001 Encounter for general adult medical examination with abnormal findings: Secondary | ICD-10-CM

## 2019-09-05 DIAGNOSIS — E782 Mixed hyperlipidemia: Secondary | ICD-10-CM | POA: Diagnosis not present

## 2019-09-05 DIAGNOSIS — E039 Hypothyroidism, unspecified: Secondary | ICD-10-CM | POA: Diagnosis not present

## 2019-09-05 DIAGNOSIS — R300 Dysuria: Secondary | ICD-10-CM

## 2019-09-05 DIAGNOSIS — K219 Gastro-esophageal reflux disease without esophagitis: Secondary | ICD-10-CM

## 2019-09-05 DIAGNOSIS — R0602 Shortness of breath: Secondary | ICD-10-CM

## 2019-09-05 DIAGNOSIS — Z1231 Encounter for screening mammogram for malignant neoplasm of breast: Secondary | ICD-10-CM

## 2019-09-05 DIAGNOSIS — G4700 Insomnia, unspecified: Secondary | ICD-10-CM

## 2019-09-05 DIAGNOSIS — Z780 Asymptomatic menopausal state: Secondary | ICD-10-CM | POA: Diagnosis not present

## 2019-09-05 DIAGNOSIS — M8589 Other specified disorders of bone density and structure, multiple sites: Secondary | ICD-10-CM | POA: Diagnosis not present

## 2019-09-05 DIAGNOSIS — Z13820 Encounter for screening for osteoporosis: Secondary | ICD-10-CM | POA: Diagnosis not present

## 2019-09-07 ENCOUNTER — Ambulatory Visit: Payer: PPO | Attending: Internal Medicine

## 2019-09-07 DIAGNOSIS — Z23 Encounter for immunization: Secondary | ICD-10-CM

## 2019-09-07 NOTE — Progress Notes (Signed)
   Covid-19 Vaccination Clinic  Name:  Marissa Wilson    MRN: [id number] DOB: Nov 25, 1945  09/07/2019  [name] was observed post Covid-19 immunization for 15 minutes without incidence. She was provided with Vaccine Information Sheet and instruction to access the V-Safe system.   [name] was instructed to call 911 with any severe reactions post vaccine: Marland Kitchen Difficulty breathing  . Swelling of your face and throat  . A fast heartbeat  . A bad rash all over your body  . Dizziness and weakness    Immunizations Administered    Name Date Dose VIS Date Route   Pfizer COVID-19 Vaccine 09/07/2019  9:04 AM 0.3 mL 07/27/2019 Intramuscular   Manufacturer: [place]   Lot: [id number]   [place]: [id number]

## 2019-09-10 NOTE — Progress Notes (Signed)
Please advise pt that her BMD is slightly worse than before,she has osteopenia ( thinning of bones) not osteoporosis She needs to continue to take calcium and vit D. She does not need to be on any medications

## 2019-09-11 ENCOUNTER — Telehealth: Payer: Self-pay

## 2019-09-11 NOTE — Telephone Encounter (Signed)
-----   Message from Lavera Guise, MD sent at 09/10/2019 11:05 AM EST ----- Please advise pt that her BMD is slightly worse than before,she has osteopenia ( thinning of bones) not osteoporosis She needs to continue to take calcium and vit D. She does not need to be on any medications

## 2019-09-11 NOTE — Telephone Encounter (Signed)
PT WAS INFORMED THAT BMD IS SLIGHTLY WORSE THAN BEFORE AND DUE TO OSTEOPENIA, THINNING OF BONES, SO CONTINUE USE OF CALCIUM AND VITAMIN D.

## 2019-09-11 NOTE — Telephone Encounter (Signed)
PT WAS NOTIFIED. 

## 2019-09-16 NOTE — Progress Notes (Signed)
Cardiology Office Note  Date:  09/17/2019   ID:  Marissa, Wilson 10-08-1945, MRN [id number]  PCP:  Lavera Guise, MD   Chief Complaint  Patient presents with  . other    C/o sob. Meds reviewed verbally with pt.    HPI:  [name] is a 74 year old woman with past medical history of Nonsmoker No diabetes Hyperlipidemia, unable to tolerate statins Chronic leg pain/Phlebitis, varicose veins Who presents for shortness of breath  She reports having shortness of breath symptoms over the past month Denies significant weight gain Sx at rest and with exertion  Sedentary this past year but reports that in general she is active Lots of errands around the house, lots of housework  Often able to do these activities without shortness of breath symptoms  Will find sometimes having shortness of breath when she is sitting there  Wonders if it could be anxiety   Had shortness of breath several times at nighttime  EKG personally reviewed by myself on todays visit Shows normal sinus rhythm rate 87 bpm poor R wave progression to the anterior precordial leads, left axis deviation   PMH:   has a past medical history of Asthma, Clotting disorder ([place]), Gastrointestinal disorder, GERD (gastroesophageal reflux disease), melanoma excision, Hyperlipidemia, unspecified, Hypertension, Hypothyroid, Osteoarthritis, Sleep apnea, Thyroid disease, and UTI (urinary tract infection).  PSH:    Past Surgical History:  Procedure Laterality Date  . ABDOMINAL HYSTERECTOMY    . PR ENDOVENOUS LASTER, 1ST VEIN Right 06/09/2017   Procedure: EVLA of Right GSV; Surgeon: Lauretta Grill, MD; Location: MAIN [facility]; Service: Vascular  . PR LIGATN LONG SAPHENOUS VEIN AT SEPH-FEM JUNC Right 06/09/2017   Procedure: LIGATION & DIVISION OF LONG SAPHENOUS VEIN AT SAPHENOFEMORAL JUNCTION, OR DISTAL INTERRUPTIONS; Surgeon: Lauretta Grill, MD; Location: MAIN [facility]; Service: Vascular  . PR PHLEB  VEINS EXTREM TO 20 Right 06/09/2017   Procedure: STAB PHLEBECTOMY OF VARICOSE VEINS, 1 EXTREMITY: 10-20 STAB INCISIONS; Surgeon: Lauretta Grill, MD; Location: MA[facility]; Service: Vascular    Current Outpatient Medications  Medication Sig Dispense Refill  . acetaminophen (TYLENOL) 650 MG CR tablet Take 650 mg every 6 (six) hours by mouth.    . Ascorbic Acid (VITAMIN C PO) Take 500 mg by mouth daily.    . Cholecalciferol 1000 units capsule Take 1,000 Units daily by mouth.    . conjugated estrogens (PREMARIN) vaginal cream Place 1 Applicatorful vaginally daily. Use pea sized amount M-W-Fr before bedtime 42.5 g 12  . levofloxacin (LEVAQUIN) 500 MG tablet Take 1 tablet (500 mg total) by mouth daily. 7 tablet 0  . levothyroxine (SYNTHROID) 75 MCG tablet TAKE 1 TABLET BY MOUTH EVERY DAY 90 tablet 1  . montelukast (SINGULAIR) 10 MG tablet TAKE 1 TABLET BY MOUTH EVERYDAY AT BEDTIME 90 tablet 3  . omeprazole (PRILOSEC) 40 MG capsule TAKE 1 CAPSULE BY MOUTH EVERY DAY 90 capsule 4  . rivaroxaban (XARELTO) 10 MG TABS tablet Take 10 mg by mouth at bedtime.    . triamterene-hydrochlorothiazide (DYAZIDE) 37.5-25 MG capsule TAKE 1 CAPSULE BY MOUTH IN THE MORNING FOR FLUID 90 capsule 3  . vitamin k 100 MCG tablet Take 100 mcg by mouth daily.     No current facility-administered medications for this visit.     Allergies:   Amoxicillin, Ampicillin, Cefazolin, Nsaids, and Penicillins   Social History:  The patient  reports that she has never smoked. She has never used smokeless tobacco. She reports  current alcohol use. She reports that she does not use drugs.   Family History:   family history includes Breast cancer (age of onset: 64) in her mother; Heart attack in her father; Lung cancer in her father; Varicose Veins in her mother.    Review of Systems: Review of Systems  Constitutional: Negative.   HENT: Negative.   Respiratory: Positive for shortness of breath.   Cardiovascular: Negative.    Gastrointestinal: Negative.   Musculoskeletal: Negative.   Neurological: Negative.   Psychiatric/Behavioral: Negative.   All other systems reviewed and are negative.    PHYSICAL EXAM: VS:  BP 121/72 (BP Location: Right Arm, Patient Position: Sitting, Cuff Size: Normal)   Pulse 87   Ht 5\' 3"  (1.6 m)   Wt 156 lb 8 oz (71 kg)   SpO2 96%   BMI 27.72 kg/m  , BMI Body mass index is 27.72 kg/m. GEN: Well nourished, well developed, in no acute distress HEENT: normal Neck: no JVD, carotid bruits, or masses Cardiac: RRR; no murmurs, rubs, or gallops,no edema  Respiratory:  clear to auscultation bilaterally, normal work of breathing GI: soft, nontender, nondistended, + BS MS: no deformity or atrophy Skin: warm and dry, no rash Neuro:  Strength and sensation are intact Psych: euthymic mood, full affect   Recent Labs: 08/30/2019: Hemoglobin 15.5; Platelets 253; TSH 1.490    Lipid Panel Lab Results  Component Value Date   CHOL 238 (H) 08/30/2019   HDL 61 08/30/2019   LDLCALC 155 (H) 08/30/2019   TRIG 125 08/30/2019      Wt Readings from Last 3 Encounters:  09/17/19 156 lb 8 oz (71 kg)  08/28/19 158 lb 6.4 oz (71.8 kg)  01/16/19 153 lb (69.4 kg)       ASSESSMENT AND PLAN:  Problem List Items Addressed This Visit      Cardiology Problems   Acute deep vein thrombosis (DVT) of proximal vein of lower extremity (HCC)   Relevant Orders   EKG 12-Lead   Mixed hyperlipidemia   Relevant Orders   EKG 12-Lead   Phlebitis and thrombophlebitis of femoral vein (HCC) - Primary    Other Visit Diagnoses    Dyspnea, unspecified type       Relevant Orders   ECHOCARDIOGRAM COMPLETE     Shortness of breath Etiology unclear, she is concerned that anxiety could be contributing to her symptoms Does not appear to be in heart failure, few risk factors for ischemia apart from hyperlipidemia We have recommended echocardiogram to rule out pulmonary hypertension, cardiomyopathy --We also  discussed CT coronary calcium scoring for risk stratification given her hyperlipidemia  Hyperlipidemia CT coronary calcium score discussed as above Information provided, she will call us if she would like to have this ordered  Phlebitis/varicosities Recommended compression hose Prior history of ablation of her veins   Disposition:   F/U as needed We will call her with the results of the echocardiogram   Total encounter time more than 60 minutes  Greater than 50% was spent in counseling and coordination of care with the patient    Signed, Esmond Plants, M.D., Ph.D. [place], [place]

## 2019-09-17 ENCOUNTER — Ambulatory Visit (INDEPENDENT_AMBULATORY_CARE_PROVIDER_SITE_OTHER): Payer: PPO | Admitting: Cardiovascular Disease

## 2019-09-17 ENCOUNTER — Other Ambulatory Visit: Payer: Self-pay

## 2019-09-17 ENCOUNTER — Encounter: Payer: Self-pay | Admitting: Cardiovascular Disease

## 2019-09-17 VITALS — BP 121/72 | HR 87 | Ht 63.0 in | Wt 156.5 lb

## 2019-09-17 DIAGNOSIS — I8010 Phlebitis and thrombophlebitis of unspecified femoral vein: Secondary | ICD-10-CM

## 2019-09-17 DIAGNOSIS — E782 Mixed hyperlipidemia: Secondary | ICD-10-CM

## 2019-09-17 DIAGNOSIS — I824Y9 Acute embolism and thrombosis of unspecified deep veins of unspecified proximal lower extremity: Secondary | ICD-10-CM

## 2019-09-17 DIAGNOSIS — R0600 Dyspnea, unspecified: Secondary | ICD-10-CM | POA: Diagnosis not present

## 2019-09-17 NOTE — Patient Instructions (Addendum)
Medication Instructions:  No changes  If you need a refill on your cardiac medications before your next appointment, please call your pharmacy.    Lab work: No new labs needed   If you have labs (blood work) drawn today and your tests are completely normal, you will receive your results only by: Marland Kitchen MyChart Message (if you have MyChart) OR . A paper copy in the mail If you have any lab test that is abnormal or we need to change your treatment, we will call you to review the results.   Testing/Procedures: Your physician has requested that you have an echocardiogram. Echocardiography is a painless test that uses sound waves to create images of your heart. It provides your doctor with information about the size and shape of your heart and how well your heart's chambers and valves are working. This procedure takes approximately one hour. There are no restrictions for this procedure.   We will order CT coronary calcium score $150   Please call [phone] to schedule     [facility]  [address], [place] [address]  Follow-Up: At [facility], you and your health needs are our priority.  As part of our continuing mission to provide you with exceptional heart care, we have created designated Provider Care Teams.  These Care Teams include your primary Cardiologist (physician) and Advanced Practice Providers (APPs -  Physician Assistants and Nurse Practitioners) who all work together to provide you with the care you need, when you need it.  . Follow up as needed We will call with results of the stress test   . Providers on your designated Care Team:   . Murray Hodgkins, NP . Christell Faith, PA-C . Marrianne Mood, PA-C  Any Other Special Instructions Will Be Listed Below (If Applicable).  For educational health videos Log in to : [email or web address] Or : [email or web address], password : [email or web address]

## 2019-09-28 ENCOUNTER — Ambulatory Visit: Payer: PPO | Attending: Internal Medicine

## 2019-09-28 DIAGNOSIS — Z23 Encounter for immunization: Secondary | ICD-10-CM | POA: Insufficient documentation

## 2019-09-28 NOTE — Progress Notes (Signed)
   Covid-19 Vaccination Clinic  Name:  Marissa Wilson    MRN: [id number] DOB: Oct 11, 1945  09/28/2019  Marissa Wilson was observed post Covid-19 immunization for 15 minutes without incidence. She was provided with Vaccine Information Sheet and instruction to access the V-Safe system.   Marissa Wilson was instructed to call 911 with any severe reactions post vaccine: Marland Kitchen Difficulty breathing  . Swelling of your face and throat  . A fast heartbeat  . A bad rash all over your body  . Dizziness and weakness    Immunizations Administered    Name Date Dose VIS Date Route   Pfizer COVID-19 Vaccine 09/28/2019 10:17 AM 0.3 mL 07/27/2019 Intramuscular   Manufacturer: [place]   Lot: [id number]   [place]: [id number]

## 2019-11-01 DIAGNOSIS — I8290 Acute embolism and thrombosis of unspecified vein: Secondary | ICD-10-CM | POA: Diagnosis not present

## 2019-12-04 ENCOUNTER — Other Ambulatory Visit: Payer: Self-pay

## 2019-12-04 ENCOUNTER — Ambulatory Visit (INDEPENDENT_AMBULATORY_CARE_PROVIDER_SITE_OTHER): Payer: PPO

## 2019-12-04 DIAGNOSIS — R0600 Dyspnea, unspecified: Secondary | ICD-10-CM

## 2019-12-10 ENCOUNTER — Telehealth: Payer: Self-pay | Admitting: *Deleted

## 2019-12-10 NOTE — Telephone Encounter (Signed)
-----   Message from Minna Merritts, MD sent at 12/09/2019  4:49 PM EDT ----- Echocardiogram Normal LV function, no significant valve disease Normal pressures

## 2019-12-10 NOTE — Telephone Encounter (Signed)
Left voicemail message to call back for results.  

## 2019-12-12 NOTE — Telephone Encounter (Signed)
Spoke with patient and reviewed her echo results with her. She verbalized understanding with no further questions at this time.

## 2020-02-21 ENCOUNTER — Telehealth: Payer: Self-pay

## 2020-02-21 NOTE — Telephone Encounter (Signed)
Patient rescheduled appointment on 02/21/2020 to 03/21/2020. klh

## 2020-02-22 ENCOUNTER — Telehealth: Payer: Self-pay

## 2020-02-22 ENCOUNTER — Encounter: Payer: Self-pay | Admitting: Nurse Practitioner

## 2020-02-22 ENCOUNTER — Ambulatory Visit
Admission: RE | Admit: 2020-02-22 | Discharge: 2020-02-22 | Disposition: A | Discharge status: Home / Self Care | Payer: PPO | Source: Ambulatory Visit | Attending: Nurse Practitioner | Admitting: Nurse Practitioner

## 2020-02-22 ENCOUNTER — Other Ambulatory Visit: Payer: Self-pay | Admitting: Nurse Practitioner

## 2020-02-22 ENCOUNTER — Ambulatory Visit (INDEPENDENT_AMBULATORY_CARE_PROVIDER_SITE_OTHER): Payer: PPO | Admitting: Nurse Practitioner

## 2020-02-22 ENCOUNTER — Other Ambulatory Visit: Payer: Self-pay

## 2020-02-22 VITALS — BP 142/70 | HR 66 | Temp 98.2°F | Resp 16 | Ht 63.0 in | Wt 154.6 lb

## 2020-02-22 DIAGNOSIS — R300 Dysuria: Secondary | ICD-10-CM

## 2020-02-22 DIAGNOSIS — K921 Melena: Secondary | ICD-10-CM | POA: Diagnosis not present

## 2020-02-22 DIAGNOSIS — N390 Urinary tract infection, site not specified: Secondary | ICD-10-CM | POA: Diagnosis not present

## 2020-02-22 DIAGNOSIS — R109 Unspecified abdominal pain: Secondary | ICD-10-CM | POA: Insufficient documentation

## 2020-02-22 DIAGNOSIS — K862 Cyst of pancreas: Secondary | ICD-10-CM | POA: Diagnosis not present

## 2020-02-22 DIAGNOSIS — K5732 Diverticulitis of large intestine without perforation or abscess without bleeding: Secondary | ICD-10-CM | POA: Diagnosis not present

## 2020-02-22 LAB — POCT URINALYSIS DIPSTICK
Blood, UA: NEGATIVE
Glucose, UA: NEGATIVE
Ketones, UA: NEGATIVE
Nitrite, UA: NEGATIVE
Protein, UA: NEGATIVE
Spec Grav, UA: 1.01 (ref 1.010–1.025)
Urobilinogen, UA: 0.2 E.U./dL
pH, UA: 7.5 (ref 5.0–8.0)

## 2020-02-22 MED ORDER — IOHEXOL 300 MG/ML  SOLN
85.0000 mL | Freq: Once | INTRAMUSCULAR | Status: AC | PRN
  Administered 2020-02-22: 85 mL via INTRAVENOUS

## 2020-02-22 MED ORDER — CIPROFLOXACIN HCL 500 MG PO TABS: 500.0000 mg | ORAL_TABLET | Freq: Two times a day (BID) | ORAL | 0 refills | Status: DC

## 2020-02-22 MED ORDER — IOHEXOL 300 MG/ML  SOLN: 100.0000 mL | Freq: Once | INTRAMUSCULAR | Status: DC | PRN

## 2020-02-22 MED ORDER — HYDROCODONE-ACETAMINOPHEN 5-325 MG PO TABS: 1.0000 | ORAL_TABLET | Freq: Four times a day (QID) | ORAL | 0 refills | Status: DC | PRN

## 2020-02-22 NOTE — Telephone Encounter (Signed)
PT SCHEDULED FOR STAT CT ABD SCAN ON 02/22/2020 @ 12:00 KP/TAT  *NO AUTH REQ'D TAT*

## 2020-02-22 NOTE — Progress Notes (Signed)
Spoke with patient over the phone discussing results with her. Was started on cipro 500mg  bid for 10 days. Was also given short term prescription for hydrocodone/APAP 5/325mg  to take as needed and as prescribed. Will follow up in one week.

## 2020-02-22 NOTE — Progress Notes (Signed)
Added CT pelvis with contrast to CT abdomen for further evaluation.

## 2020-02-22 NOTE — Progress Notes (Signed)
[facility] [place], [place] [address]  Internal MEDICINE  Office Visit Note  Patient Name: Marissa Wilson  [id number]  [id number]  Date of Service: 02/22/2020  Chief Complaint  Patient presents with  . Abdominal Pain    lower abdominal pain and blood when wiping after bowel movement  . Quality Metric Gaps    TDAP, Eye Exam and PNA Vaccine     The patient is here for acute visit.  -severe abdominal pain started last night. Unable to sleep due to pain. Reports 10/10 in severity. This is most severe on both sides of suprapubic area of the abdomen. Standing and sitting increase pain. Feels increased pressure into the abdomen when she sits. Constantly changing position to get slightly comfortable. States that this morning, after having bowel movement, she wiped some bright red blood from her rectum.   Pt is here for a sick visit.     Current Medication:  Outpatient Encounter Medications as of 02/22/2020  Medication Sig  . acetaminophen (TYLENOL) 650 MG CR tablet Take 650 mg every 6 (six) hours by mouth.  . Ascorbic Acid (VITAMIN C PO) Take 500 mg by mouth daily.  . Cholecalciferol 1000 units capsule Take 1,000 Units daily by mouth.  . conjugated estrogens (PREMARIN) vaginal cream Place 1 Applicatorful vaginally daily. Use pea sized amount M-W-Fr before bedtime  . levofloxacin (LEVAQUIN) 500 MG tablet Take 1 tablet (500 mg total) by mouth daily.  Marland Kitchen levothyroxine (SYNTHROID) 75 MCG tablet TAKE 1 TABLET BY MOUTH EVERY DAY  . montelukast (SINGULAIR) 10 MG tablet TAKE 1 TABLET BY MOUTH EVERYDAY AT BEDTIME  . omeprazole (PRILOSEC) 40 MG capsule TAKE 1 CAPSULE BY MOUTH EVERY DAY  . rivaroxaban (XARELTO) 10 MG TABS tablet Take 10 mg by mouth at bedtime.  . triamterene-hydrochlorothiazide (DYAZIDE) 37.5-25 MG capsule TAKE 1 CAPSULE BY MOUTH IN THE MORNING FOR FLUID  . vitamin k 100 MCG tablet Take 100 mcg by mouth daily.  . ciprofloxacin (CIPRO) 500 MG tablet  Take 1 tablet (500 mg total) by mouth 2 (two) times daily.  Marland Kitchen HYDROcodone-acetaminophen (NORCO/VICODIN) 5-325 MG tablet Take 1 tablet by mouth every 6 (six) hours as needed for moderate pain or severe pain.   No facility-administered encounter medications on file as of 02/22/2020.      Medical History: Past Medical History:  Diagnosis Date  . Asthma   . Clotting disorder ([place])   . Gastrointestinal disorder   . GERD (gastroesophageal reflux disease)   . Hx of melanoma excision   . Hyperlipidemia, unspecified   . Hypertension   . Hypothyroid   . Osteoarthritis   . Sleep apnea   . Thyroid disease   . UTI (urinary tract infection)      Today's Vitals   02/22/20 1004  BP: (!) 142/70  Pulse: 66  Resp: 16  Temp: 98.2 F (36.8 C)  SpO2: 98%  Weight: 154 lb 9.6 oz (70.1 kg)  Height: 5\' 3"  (1.6 m)   Body mass index is 27.39 kg/m.  Review of Systems  Constitutional: Positive for chills. Negative for fatigue, fever and unexpected weight change.  HENT: Negative for congestion, postnasal drip, rhinorrhea, sneezing and sore throat.   Respiratory: Negative for cough, chest tightness, shortness of breath and wheezing.   Cardiovascular: Negative for chest pain and palpitations.  Gastrointestinal: Positive for abdominal pain and blood in stool. Negative for constipation, diarrhea, nausea and vomiting.  Genitourinary: Positive for dysuria and pelvic pain. Negative for frequency.  Musculoskeletal: Negative for arthralgias, back pain, joint swelling and neck pain.  Skin: Negative for rash.  Neurological: Negative for dizziness, tremors, numbness and headaches.  Hematological: Negative for adenopathy. Does not bruise/bleed easily.  Psychiatric/Behavioral: Negative for behavioral problems (Depression), sleep disturbance and suicidal ideas. The patient is not nervous/anxious.     Physical Exam Vitals and nursing note reviewed.  Constitutional:      General: She is in acute distress.      Appearance: She is well-developed. She is not diaphoretic.     Comments: Patient appears to be in acute pain   HENT:     Head: Normocephalic and atraumatic.     Mouth/Throat:     Pharynx: No oropharyngeal exudate.  Eyes:     Pupils: Pupils are equal, round, and reactive to light.  Neck:     Thyroid: No thyromegaly.     Vascular: No JVD.     Trachea: No tracheal deviation.  Cardiovascular:     Rate and Rhythm: Normal rate and regular rhythm.     Heart sounds: Normal heart sounds. No murmur heard.  No friction rub. No gallop.   Pulmonary:     Effort: Pulmonary effort is normal. No respiratory distress.     Breath sounds: Normal breath sounds. No wheezing or rales.  Chest:     Chest wall: No tenderness.  Abdominal:     General: Abdomen is flat. Bowel sounds are normal.     Palpations: Abdomen is soft.     Tenderness: There is generalized abdominal tenderness. There is guarding and rebound.  Genitourinary:    Comments: Urine is positive for small WBC and small bilirubin Musculoskeletal:        General: Normal range of motion.     Cervical back: Normal range of motion and neck supple.  Lymphadenopathy:     Cervical: No cervical adenopathy.  Skin:    General: Skin is warm and dry.  Neurological:     Mental Status: She is alert and oriented to person, place, and time.     Cranial Nerves: No cranial nerve deficit.  Psychiatric:        Behavior: Behavior normal.        Thought Content: Thought content normal.        Judgment: Judgment normal.    Assessment/Plan: 1. Sudden onset of severe abdominal pain Severe abdominal pain, most significant in right lower quadrant and suprapubic area of the abdomen. Will get STAT CT of abdomen for further evaluation. Add hydrocodone/APAP 5/325mg  up to QID as needed for severe pain. - HYDROcodone-acetaminophen (NORCO/VICODIN) 5-325 MG tablet; Take 1 tablet by mouth every 6 (six) hours as needed for moderate pain or severe pain.  Dispense: 30  tablet; Refill: 0 - CT ABDOMEN W CONTRAST; Future  2. Urinary tract infection without hematuria, site unspecified Start ciprofloxacin 500mg  twice daily for 7 days. Send urine for culture and sensitivity and adjust antibiotics as indicated.  - ciprofloxacin (CIPRO) 500 MG tablet; Take 1 tablet (500 mg total) by mouth 2 (two) times daily.  Dispense: 14 tablet; Refill: 0  3. Melena Presence of blood in stool this morning. STAT CT abdomen ordered for further evaluation.  - CT ABDOMEN W CONTRAST; Future  4. Dysuria Treat with cipro for 7 days. Adjust antibiotics as indicated based on results of culture and sensitivity.  - CULTURE, URINE COMPREHENSIVE - POCT Urinalysis Dipstick  General Counseling: Marissa Wilson verbalizes understanding of the findings of todays visit and agrees with plan of treatment.  I have discussed any further diagnostic evaluation that may be needed or ordered today. We also reviewed her medications today. she has been encouraged to call the office with any questions or concerns that should arise related to todays visit.    Counseling:  Reviewed risks and possible side effects associated with taking opiates, benzodiazepines and other CNS depressants. Combination of these could cause dizziness and drowsiness. Advised patient not to drive or operate machinery when taking these medications, as patient's and other's life can be at risk and will have consequences. Patient verbalized understanding in this matter. Dependence and abuse for these drugs will be monitored closely. A Controlled substance policy and procedure is on file which allows [facility] to order a urine drug screen test at any visit. Patient understands and agrees with the plan  This patient was seen by Leretha Pol FNP Collaboration with Dr Lavera Guise as a part of collaborative care agreement  Orders Placed This Encounter  Procedures  . CULTURE, URINE COMPREHENSIVE  . CT ABDOMEN W CONTRAST  . POCT  Urinalysis Dipstick    Meds ordered this encounter  Medications  . HYDROcodone-acetaminophen (NORCO/VICODIN) 5-325 MG tablet    Sig: Take 1 tablet by mouth every 6 (six) hours as needed for moderate pain or severe pain.    Dispense:  30 tablet    Refill:  0    Order Specific Question:   Supervising Provider    Answer:   Lavera Guise [[id number]]  . ciprofloxacin (CIPRO) 500 MG tablet    Sig: Take 1 tablet (500 mg total) by mouth 2 (two) times daily.    Dispense:  14 tablet    Refill:  0    Order Specific Question:   Supervising Provider    Answer:   Lavera Guise [[id number]]    Time spent: 30 Minutes

## 2020-02-22 NOTE — Progress Notes (Signed)
Follow up scheduled in one week. Will discuss nonacute findings at that time. Currently treated for acute sigmoid diverticulitis.

## 2020-02-24 ENCOUNTER — Other Ambulatory Visit: Payer: Self-pay | Admitting: Internal Medicine

## 2020-02-24 DIAGNOSIS — J3089 Other allergic rhinitis: Secondary | ICD-10-CM

## 2020-02-25 ENCOUNTER — Ambulatory Visit
Admission: RE | Admit: 2020-02-25 | Discharge: 2020-02-25 | Disposition: A | Discharge status: Home / Self Care | Payer: PPO | Source: Ambulatory Visit | Attending: Internal Medicine | Admitting: Internal Medicine

## 2020-02-25 ENCOUNTER — Ambulatory Visit: Payer: PPO | Admitting: Adult Health

## 2020-02-25 DIAGNOSIS — Z1231 Encounter for screening mammogram for malignant neoplasm of breast: Secondary | ICD-10-CM | POA: Insufficient documentation

## 2020-02-25 LAB — CULTURE, URINE COMPREHENSIVE

## 2020-02-25 LAB — POCT I-STAT CREATININE: Creatinine, Ser: 0.6 mg/dL (ref 0.44–1.00)

## 2020-02-25 NOTE — Progress Notes (Signed)
Started patient on cipro at her visit.

## 2020-02-26 ENCOUNTER — Ambulatory Visit (INDEPENDENT_AMBULATORY_CARE_PROVIDER_SITE_OTHER): Payer: PPO | Admitting: Internal Medicine

## 2020-02-26 ENCOUNTER — Encounter: Payer: Self-pay | Admitting: Internal Medicine

## 2020-02-26 ENCOUNTER — Other Ambulatory Visit: Payer: Self-pay

## 2020-02-26 DIAGNOSIS — K5792 Diverticulitis of intestine, part unspecified, without perforation or abscess without bleeding: Secondary | ICD-10-CM

## 2020-02-26 DIAGNOSIS — Z1211 Encounter for screening for malignant neoplasm of colon: Secondary | ICD-10-CM

## 2020-02-26 DIAGNOSIS — I82812 Embolism and thrombosis of superficial veins of left lower extremities: Secondary | ICD-10-CM

## 2020-02-26 MED ORDER — METRONIDAZOLE 500 MG PO TABS
500.0000 mg | ORAL_TABLET | Freq: Two times a day (BID) | ORAL | 0 refills | Status: DC
Start: 2020-02-26 — End: 2021-02-12

## 2020-02-26 NOTE — Patient Instructions (Signed)

## 2020-02-26 NOTE — Progress Notes (Signed)
[facility] [place], [place] [address]  Internal MEDICINE  Office Visit Note  Patient Name: Marissa Wilson  [id number]  [id number]  Date of Service: 03/17/2020  Chief Complaint  Patient presents with  . Follow-up    abdominal pain     HPI Pt is here for follow up for acute abdominal pain , diagnosis of acute diverticulitis was made per CT scan of abdominal and pelvis. She has been on levaquin for few days, does feel better, good po intake, denies any fever or chills. 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 Takes synthroid and triamterene/hctz routinely.    Current Medication: Outpatient Encounter Medications as of 02/26/2020  Medication Sig  . acetaminophen (TYLENOL) 650 MG CR tablet Take 650 mg every 6 (six) hours by mouth.  . Ascorbic Acid (VITAMIN C PO) Take 500 mg by mouth daily.  . Cholecalciferol 1000 units capsule Take 1,000 Units daily by mouth.  . ciprofloxacin (CIPRO) 500 MG tablet Take 1 tablet (500 mg total) by mouth 2 (two) times daily.  Marland Kitchen conjugated estrogens (PREMARIN) vaginal cream Place 1 Applicatorful vaginally daily. Use pea sized amount M-W-Fr before bedtime  . HYDROcodone-acetaminophen (NORCO/VICODIN) 5-325 MG tablet Take 1 tablet by mouth every 6 (six) hours as needed for moderate pain or severe pain.  Marland Kitchen levofloxacin (LEVAQUIN) 500 MG tablet Take 1 tablet (500 mg total) by mouth daily.  Marland Kitchen levothyroxine (SYNTHROID) 75 MCG tablet TAKE 1 TABLET BY MOUTH EVERY DAY  . montelukast (SINGULAIR) 10 MG tablet TAKE 1 TABLET BY MOUTH EVERYDAY AT BEDTIME  . omeprazole (PRILOSEC) 40 MG capsule TAKE 1 CAPSULE BY MOUTH EVERY DAY  . rivaroxaban (XARELTO) 10 MG TABS tablet Take 10 mg by mouth at bedtime.  . triamterene-hydrochlorothiazide (DYAZIDE) 37.5-25 MG capsule TAKE 1 CAPSULE BY MOUTH IN THE MORNING FOR FLUID  . vitamin k 100 MCG tablet Take 100 mcg by mouth daily.  . metroNIDAZOLE (FLAGYL) 500 MG tablet Take 1 tablet (500 mg total) by mouth 2 (two)  times daily.   No facility-administered encounter medications on file as of 02/26/2020.    Surgical History: Past Surgical History:  Procedure Laterality Date  . ABDOMINAL HYSTERECTOMY    . PR ENDOVENOUS LASTER, 1ST VEIN Right 06/09/2017   Procedure: EVLA of Right GSV; Surgeon: Lauretta Grill, MD; Location: MAIN OR [facility]; Service: Vascular  . PR LIGATN LONG SAPHENOUS VEIN AT SEPH-FEM JUNC Right 06/09/2017   Procedure: LIGATION & DIVISION OF LONG SAPHENOUS VEIN AT SAPHENOFEMORAL JUNCTION, OR DISTAL INTERRUPTIONS; Surgeon: Lauretta Grill, MD; Location: MAIN OR [facility]; Service: Vascular  . PR PHLEB VEINS EXTREM TO 20 Right 06/09/2017   Procedure: STAB PHLEBECTOMY OF VARICOSE VEINS, 1 EXTREMITY: 10-20 STAB INCISIONS; Surgeon: Lauretta Grill, MD; Location: MAIN OR [facility]; Service: Vascular    Medical History: Past Medical History:  Diagnosis Date  . Asthma   . Clotting disorder ([place])   . Gastrointestinal disorder   . GERD (gastroesophageal reflux disease)   . Hx of melanoma excision   . Hyperlipidemia, unspecified   . Hypertension   . Hypothyroid   . Osteoarthritis   . Sleep apnea   . Thyroid disease   . UTI (urinary tract infection)     Family History: Family History  Problem Relation Age of Onset  . Breast cancer Mother 57  . Varicose Veins Mother   . Lung cancer Father   . Heart attack Father   . Bladder Cancer Neg Hx   . Kidney cancer  Neg Hx     Social History   Socioeconomic History  . Marital status: Married    Spouse name: Nada Boozer  . Number of children: 2  . Years of education: 63  . Highest education level: Some college, no degree  Occupational History  . Not on file  Tobacco Use  . Smoking status: Never Smoker  . Smokeless tobacco: Never Used  Vaping Use  . Vaping Use: Never used  Substance and Sexual Activity  . Alcohol use: Yes    Comment: occasional wine  . Drug use: No  . Sexual activity: Not on file  Other Topics Concern  . Not  on file  Social History Narrative  . Not on file   Social Determinants of Health   Financial Resource Strain:   . Difficulty of Paying Living Expenses:   Food Insecurity:   . Worried About Charity fundraiser in the Last Year:   . Arboriculturist in the Last Year:   Transportation Needs:   . Film/video editor (Medical):   Marland Kitchen Lack of Transportation (Non-Medical):   Physical Activity:   . Days of Exercise per Week:   . Minutes of Exercise per Session:   Stress:   . Feeling of Stress :   Social Connections:   . Frequency of Communication with Friends and Family:   . Frequency of Social Gatherings with Friends and Family:   . Attends Religious Services:   . Active Member of Clubs or Organizations:   . Attends Archivist Meetings:   Marland Kitchen Marital Status:   Intimate Partner Violence:   . Fear of Current or Ex-Partner:   . Emotionally Abused:   Marland Kitchen Physically Abused:   . Sexually Abused:    Review of Systems  Constitutional: Negative for chills, diaphoresis and fatigue.  HENT: Negative for ear pain, postnasal drip and sinus pressure.   Eyes: Negative for photophobia, discharge, redness, itching and visual disturbance.  Respiratory: Negative for cough, shortness of breath and wheezing.   Cardiovascular: Negative for chest pain, palpitations and leg swelling.  Gastrointestinal: Negative for abdominal pain, constipation, diarrhea, nausea and vomiting.  Genitourinary: Negative for dysuria and flank pain.  Musculoskeletal: Negative for arthralgias, back pain, gait problem and neck pain.  Skin: Negative for color change.  Allergic/Immunologic: Negative for environmental allergies and food allergies.  Neurological: Negative for dizziness and headaches.  Hematological: Does not bruise/bleed easily.  Psychiatric/Behavioral: Negative for agitation, behavioral problems (depression) and hallucinations.    Vital Signs: BP 136/78   Pulse 61   Temp (!) 97.3 F (36.3 C)   Resp  16   Ht 5\' 3"  (1.6 m)   Wt 154 lb 3.2 oz (69.9 kg)   SpO2 99%   BMI 27.32 kg/m    Physical Exam Constitutional:      General: She is not in acute distress.    Appearance: She is well-developed. She is not diaphoretic.  HENT:     Head: Normocephalic and atraumatic.     Mouth/Throat:     Pharynx: No oropharyngeal exudate.  Eyes:     Pupils: Pupils are equal, round, and reactive to light.  Neck:     Thyroid: No thyromegaly.     Vascular: No JVD.     Trachea: No tracheal deviation.  Cardiovascular:     Rate and Rhythm: Normal rate and regular rhythm.     Heart sounds: Normal heart sounds. No murmur heard.  No friction rub. No gallop.  Pulmonary:     Effort: Pulmonary effort is normal. No respiratory distress.     Breath sounds: No wheezing or rales.  Chest:     Chest wall: No tenderness.  Abdominal:     General: Bowel sounds are normal.     Palpations: Abdomen is soft.     Tenderness: There is no rebound.  Musculoskeletal:        General: Normal range of motion.     Cervical back: Normal range of motion and neck supple.  Lymphadenopathy:     Cervical: No cervical adenopathy.  Skin:    General: Skin is warm and dry.  Neurological:     Mental Status: She is alert and oriented to person, place, and time.     Cranial Nerves: No cranial nerve deficit.  Psychiatric:        Behavior: Behavior normal.        Thought Content: Thought content normal.        Judgment: Judgment normal.    Assessment/Plan: 1. Acute diverticulitis of intestine - Finish Levaquin, add flagyl 500 mg po bid   2. Encounter for screening colonoscopy - She does need routine Colonoscopy - Ambulatory referral to Gastroenterology  3. 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 I have discussed any further diagnostic evaluation that may  be needed or ordered today. We also reviewed her medications today. she has been encouraged to call the office with any questions or concerns that should arise related to todays visit.  Orders Placed This Encounter  Procedures  . Ambulatory referral to Gastroenterology   Meds ordered this encounter  Medications  . metroNIDAZOLE (FLAGYL) 500 MG tablet    Sig: Take 1 tablet (500 mg total) by mouth 2 (two) times daily.    Dispense:  14 tablet    Refill:  0    Total time spent: 35 Minutes Time spent includes review of chart, medications, test results, and follow up plan with the patient.    Dr Lavera Guise Internal medicine

## 2020-03-21 ENCOUNTER — Ambulatory Visit: Payer: PPO | Admitting: Internal Medicine

## 2020-04-15 ENCOUNTER — Other Ambulatory Visit: Payer: Self-pay | Admitting: Internal Medicine

## 2020-04-17 ENCOUNTER — Other Ambulatory Visit: Payer: Self-pay | Admitting: General Surgery

## 2020-04-17 DIAGNOSIS — K219 Gastro-esophageal reflux disease without esophagitis: Secondary | ICD-10-CM | POA: Diagnosis not present

## 2020-04-17 DIAGNOSIS — K5792 Diverticulitis of intestine, part unspecified, without perforation or abscess without bleeding: Secondary | ICD-10-CM | POA: Diagnosis not present

## 2020-04-17 DIAGNOSIS — K921 Melena: Secondary | ICD-10-CM | POA: Diagnosis not present

## 2020-04-17 NOTE — Progress Notes (Signed)
Subjective:     Patient ID: AARIEL EMS is a 74 y.o. female.  HPI  The following portions of the patient's history were reviewed and updated as appropriate.  This a new patient is here today for: office visit. Patient has been referred by Clayborn Bigness, MD for a colonoscopy due to abdominal pain and diverticulitis.  The patient reports her abdominal symptoms resolved promptly with the institution of a course of Cipro and Flagyl.  A CT scan was ordered with the report of melena.  The patient reported black stools and no exposure to Pepto-Bismol.  These have resolved.  She has a known hiatal hernia from prior endoscopy.  Bowel movements are well formed and usually once per day.   Long history of hiatal hernia. No dysphagia for solids, occasional sense of delayed transit with liquids.   The patient has been maintained on anticoagulation therapy after she developed extensive DVT with treatment of her varicose veins in 2018.     Chief Complaint  Patient presents with  . Colonoscopy     BP 128/76   Pulse 79   Temp 36.2 C (97.1 F)   Ht 160 cm (5\' 3" )   Wt 70.3 kg (155 lb)   SpO2 95%   BMI 27.46 kg/m       Past Medical History:  Diagnosis Date  . Asthma   . Clotting disorder (CMS-HCC)   . Gastrointestinal disorder   . GERD (gastroesophageal reflux disease)   . History of melanoma   . History of UTI   . Hyperlipidemia   . Hypertension   . Hypothyroidism   . Osteoarthritis   . Sleep apnea           Past Surgical History:  Procedure Laterality Date  . HYSTERECTOMY    . PR Endovenous laster, 1st vein Right 06/09/2017   with stab phlebectomy & ligation of long saphenous vein              OB History    Gravida  2   Para  2   Term      Preterm      AB      Living        SAB      TAB      Ectopic      Molar      Multiple      Live Births          Obstetric Comments  Age at first period 8 Age of first pregnancy 6         Social History          Socioeconomic History  . Marital status: Married    Spouse name: Not on file  . Number of children: Not on file  . Years of education: Not on file  . Highest education level: Not on file  Occupational History  . Not on file  Tobacco Use  . Smoking status: Never Smoker  . Smokeless tobacco: Never Used  Substance and Sexual Activity  . Alcohol use: Yes    Comment: occassionally   . Drug use: No  . Sexual activity: Defer  Other Topics Concern  . Not on file  Social History Narrative  . Not on file   Social Determinants of Health      Financial Resource Strain:   . Difficulty of Paying Living Expenses:   Food Insecurity:   . Worried About Charity fundraiser in the Last Year:   .  Ran Out of Food in the Last Year:   Transportation Needs:   . Film/video editor (Medical):   Marland Kitchen Lack of Transportation (Non-Medical):            Allergies  Allergen Reactions  . Amoxicillin Unknown  . Ampicillin Diarrhea  . Cefazolin Diarrhea  . Nsaids (Non-Steroidal Anti-Inflammatory Drug) Unknown  . Penicillin Diarrhea    Current Medications        Current Outpatient Medications  Medication Sig Dispense Refill  . levothyroxine (SYNTHROID, LEVOTHROID) 75 MCG tablet Take 75 mcg by mouth once daily.  1  . omeprazole (PRILOSEC) 40 MG DR capsule Take 40 mg by mouth once daily.  2  . triamterene-hydrochlorothiazide (DYAZIDE) 37.5-25 mg capsule   1  . VITAMIN D3-VITAMIN K2, MK4, ORAL Take by mouth once daily       . XARELTO 10 mg tablet Take 10 mg by mouth once daily     No current facility-administered medications for this visit.           Family History  Problem Relation Age of Onset  . Breast cancer Mother   . Varicose veins Mother   . Lung cancer Father   . Myocardial Infarction (Heart attack) Father         Review of Systems  Constitutional: Negative for chills and fever.  Respiratory: Negative for  cough.        Objective:   Physical Exam Constitutional:      Appearance: Normal appearance.  Cardiovascular:     Rate and Rhythm: Normal rate and regular rhythm.     Pulses: Normal pulses.     Heart sounds: Normal heart sounds.     Comments: No right lower extremity edema.  Trace/1+ left lower extremity edema at the ankle level. Pulmonary:     Effort: Pulmonary effort is normal.     Breath sounds: Normal breath sounds.  Musculoskeletal:     Cervical back: Neck supple.  Skin:    General: Skin is warm and dry.  Neurological:     Mental Status: She is alert and oriented to person, place, and time.  Psychiatric:        Mood and Affect: Mood normal.        Behavior: Behavior normal.    Labs and Radiology:  Recently completed imaging at [facility] has been independently reviewed.    February 22, 2020 CT scan of the pelvis: IMPRESSION: Acute uncomplicated sigmoid diverticulitis.  February 22, 2020 CT scan of the abdomen: IMPRESSION: 1. No explanation for melena. Of note, significant portions of the small bowel and colon are not imaged on abdomen alone CT. The entire rectum and sigmoid colon are excluded and portion of the small bowel. 2. Moderate size hiatal hernia. 3. 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 No specific worrisome characteristics. Recommend follow-up MRI with and without contrast per pancreatic protocol in 6 months per consensus criteria. This recommendation follows ACR consensus guidelines: Management of Incidental Pancreatic Cysts: A White Paper of the ACR Incidental Findings Committee. [place] 1856;31:497-026.  The patient had a CT of the pelvis during her lower extremity DVT treatment which included the area of the pancreas.  The study was completed on April 04, 2017.  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  Essentially no interval change in over 3 years.  Laboratory  studies from early 2021 showed a hemoglobin of 15.5 with an  MCV of 90, white blood cell count of 5100 and a normal differential.  Platelet count of 253,000.  August 30, 2019.  PCP notes of March 17, 2020 were reviewed.  November 01, 2019 hematology notes from [facility] were reviewed. History of DVT and Superficial Thrombophlebitis.  1st VTE: 1st VTE age 7: on OCP at the time; involved thigh and calf veins; treated with warfarin for a few months. No other risk factors.   2nd VTE: after hysterectomy 22 yrs ago (age 63); by report a symptomatic R calf DVT. Enoxaparin for a few months transitioned to warfarin. Occurred within a week of surgery. No other risk factors at the time.   3rd DVT: occurred after laser ablation of superficial veins in 2012. Treated with warfarin. She does not recall the date of this, but there are 3 dopplers in the system for 2012 that do not show a DVT  September 2012 evaluation of protein S, protein C and antiphospholipid testing showed no abnormality.  Completed at [facility].  January 28, 2012 colonoscopy showed diverticulosis and a patchy area of erythematous mucosa in the rectum.  Completed by Gaylyn Cheers, MD Diagnosis:  RECTAL COLD BIOPSY:  - NO PATHOLOGIC CHANGES.  August 02, 2001 upper endoscopy showed a Schatzki ring without stenosis.  Completed by Gaylyn Cheers, MD    Assessment:     Recent history of diverticulitis with report of melanotic stool.  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    Plan:     The patient was notified of the radiologist recommendation for an MRI with and without contrast in 6 months.  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  We will arrange for this to be completed in December 2021 to minimize a deductible should it have been completed in  January.    Patient to be scheduled for an upper and lower endoscopy at [facility] for 05-28-20.  The patient reported significant nausea and vomiting with her last colonoscopy prep.    Patient to take Reglan 30 minutes prior to starting her colonoscopy prep and repeat in 4 hours if needed.   The patient has been instructed to take her last dose of Xarelto on Sunday, 05-25-20 and hold until after procedure.   Entered by Ledell Noss, CMA, acting as a scribe for Dr. Hervey Ard, MD.   More than 60 minutes was spent with record review, image review and laboratory review regarding her ongoing anticoagulation.  The documentation recorded by the scribe accurately reflects the service I personally performed and the decisions made by me.   Robert Bellow, MD; Allegra Grana

## 2020-04-20 ENCOUNTER — Other Ambulatory Visit: Payer: Self-pay | Admitting: General Surgery

## 2020-04-20 DIAGNOSIS — K862 Cyst of pancreas: Secondary | ICD-10-CM

## 2020-04-23 ENCOUNTER — Ambulatory Visit
Admission: RE | Admit: 2020-04-23 | Discharge: 2020-04-23 | Disposition: A | Discharge status: Home / Self Care | Payer: Self-pay | Source: Ambulatory Visit | Attending: General Surgery | Admitting: General Surgery

## 2020-04-23 ENCOUNTER — Other Ambulatory Visit: Payer: Self-pay | Admitting: General Surgery

## 2020-04-23 DIAGNOSIS — I8290 Acute embolism and thrombosis of unspecified vein: Secondary | ICD-10-CM

## 2020-04-24 ENCOUNTER — Encounter: Payer: Self-pay | Admitting: Internal Medicine

## 2020-04-28 ENCOUNTER — Ambulatory Visit
Admission: RE | Admit: 2020-04-28 | Discharge: 2020-04-28 | Disposition: A | Discharge status: Home / Self Care | Payer: PPO | Source: Ambulatory Visit | Attending: General Surgery | Admitting: General Surgery

## 2020-04-28 ENCOUNTER — Other Ambulatory Visit: Payer: Self-pay

## 2020-04-28 DIAGNOSIS — K8050 Calculus of bile duct without cholangitis or cholecystitis without obstruction: Secondary | ICD-10-CM | POA: Diagnosis not present

## 2020-04-28 DIAGNOSIS — K862 Cyst of pancreas: Secondary | ICD-10-CM

## 2020-04-28 MED ORDER — GADOBUTROL 1 MMOL/ML IV SOLN
6.0000 mL | Freq: Once | INTRAVENOUS | Status: AC | PRN
  Administered 2020-04-28: 6 mL via INTRAVENOUS

## 2020-05-01 ENCOUNTER — Encounter: Payer: Self-pay | Admitting: Internal Medicine

## 2020-05-14 ENCOUNTER — Other Ambulatory Visit: Payer: PPO

## 2020-05-14 DIAGNOSIS — Z20822 Contact with and (suspected) exposure to covid-19: Secondary | ICD-10-CM | POA: Diagnosis not present

## 2020-05-15 LAB — SARS-COV-2, NAA 2 DAY TAT

## 2020-05-15 LAB — NOVEL CORONAVIRUS, NAA: SARS-CoV-2, NAA: NOT DETECTED

## 2020-05-19 ENCOUNTER — Other Ambulatory Visit: Payer: PPO

## 2020-05-19 DIAGNOSIS — Z20822 Contact with and (suspected) exposure to covid-19: Secondary | ICD-10-CM | POA: Diagnosis not present

## 2020-05-20 LAB — SARS-COV-2, NAA 2 DAY TAT

## 2020-05-20 LAB — NOVEL CORONAVIRUS, NAA: SARS-CoV-2, NAA: DETECTED — AB

## 2020-05-23 ENCOUNTER — Encounter: Payer: Self-pay | Admitting: Adult Health

## 2020-05-23 ENCOUNTER — Ambulatory Visit (INDEPENDENT_AMBULATORY_CARE_PROVIDER_SITE_OTHER): Payer: PPO | Admitting: Adult Health

## 2020-05-23 VITALS — Resp 16 | Ht 63.0 in | Wt 163.0 lb

## 2020-05-23 DIAGNOSIS — J0110 Acute frontal sinusitis, unspecified: Secondary | ICD-10-CM

## 2020-05-23 DIAGNOSIS — U071 COVID-19: Secondary | ICD-10-CM | POA: Insufficient documentation

## 2020-05-23 MED ORDER — AZITHROMYCIN 250 MG PO TABS: ORAL_TABLET | ORAL | 0 refills | Status: DC

## 2020-05-23 NOTE — Progress Notes (Signed)
[facility] [place], [place] [address]  Internal MEDICINE  Telephone Visit  Patient Name: Marissa Wilson  [id number]  [id number]  Date of Service: 05/30/2020  I connected with the patient at 1055 by video and verified the patients identity using two identifiers.   I discussed the limitations, risks, security and privacy concerns of performing an evaluation and management service by telephone and the availability of in person appointments. I also discussed with the patient that there may be a patient responsible charge related to the service.  The patient expressed understanding and agrees to proceed.    Chief Complaint  Patient presents with  . Acute Visit    covid positive, head feels stopped up, headaches, no tase or smell, fatigued, and occ. cough  . Telephone Assessment    [id number]  . Telephone Screen    video call  . Hypertension  . Hyperlipidemia  . Quality Metric Gaps    HepC, TDAP, PNA, eye exam    HPI  Pt is seen via video.  She reports about 6 days of head cold symptoms and fatigue. She denies fever or chills.  She did test positive for covid 4 days ago, and has lost her sense of taste. She has been taking tylenol and supplements.    Current Medication: Outpatient Encounter Medications as of 05/23/2020  Medication Sig  . acetaminophen (TYLENOL) 650 MG CR tablet Take 650 mg every 6 (six) hours by mouth.  . Ascorbic Acid (VITAMIN C PO) Take 500 mg by mouth daily.  . Cholecalciferol 1000 units capsule Take 1,000 Units daily by mouth.  . ciprofloxacin (CIPRO) 500 MG tablet Take 1 tablet (500 mg total) by mouth 2 (two) times daily.  Marland Kitchen conjugated estrogens (PREMARIN) vaginal cream Place 1 Applicatorful vaginally daily. Use pea sized amount M-W-Fr before bedtime  . levofloxacin (LEVAQUIN) 500 MG tablet Take 1 tablet (500 mg total) by mouth daily.  Marland Kitchen levothyroxine (SYNTHROID) 75 MCG tablet TAKE 1 TABLET BY MOUTH EVERY DAY  . metroNIDAZOLE  (FLAGYL) 500 MG tablet Take 1 tablet (500 mg total) by mouth 2 (two) times daily.  . montelukast (SINGULAIR) 10 MG tablet TAKE 1 TABLET BY MOUTH EVERYDAY AT BEDTIME  . omeprazole (PRILOSEC) 40 MG capsule TAKE 1 CAPSULE BY MOUTH EVERY DAY  . rivaroxaban (XARELTO) 10 MG TABS tablet Take 10 mg by mouth at bedtime.  . triamterene-hydrochlorothiazide (DYAZIDE) 37.5-25 MG capsule TAKE 1 CAPSULE BY MOUTH IN THE MORNING FOR FLUID  . vitamin k 100 MCG tablet Take 100 mcg by mouth daily.  Marland Kitchen azithromycin (ZITHROMAX) 250 MG tablet Take as directed  . HYDROcodone-acetaminophen (NORCO/VICODIN) 5-325 MG tablet Take 1 tablet by mouth every 6 (six) hours as needed for moderate pain or severe pain. (Patient not taking: Reported on 05/23/2020)   No facility-administered encounter medications on file as of 05/23/2020.    Surgical History: Past Surgical History:  Procedure Laterality Date  . ABDOMINAL HYSTERECTOMY    . PR ENDOVENOUS LASTER, 1ST VEIN Right 06/09/2017   Procedure: EVLA of Right GSV; Surgeon: Lauretta Grill, MD; Location: [facility]; Service: Vascular  . PR LIGATN LONG SAPHENOUS VEIN AT SEPH-FEM JUNC Right 06/09/2017   Procedure: LIGATION & DIVISION OF LONG SAPHENOUS VEIN AT SAPHENOFEMORAL JUNCTION, OR DISTAL INTERRUPTIONS; Surgeon: Lauretta Grill, MD; Location: MAIN OR [facility]; Service: Vascular  . PR PHLEB VEINS EXTREM TO 20 Right 06/09/2017   Procedure: STAB PHLEBECTOMY OF VARICOSE VEINS, 1 EXTREMITY: 10-20 STAB INCISIONS; Surgeon: Damien Fusi  Haynes Kerns, MD; Location: MAIN OR [facility]; Service: Vascular    Medical History: Past Medical History:  Diagnosis Date  . Asthma   . Clotting disorder ([place])   . Gastrointestinal disorder   . GERD (gastroesophageal reflux disease)   . Hx of melanoma excision   . Hyperlipidemia, unspecified   . Hypertension   . Hypothyroid   . Osteoarthritis   . Sleep apnea   . Thyroid disease   . UTI (urinary tract infection)     Family  History: Family History  Problem Relation Age of Onset  . Breast cancer Mother 80  . Varicose Veins Mother   . Lung cancer Father   . Heart attack Father   . Bladder Cancer Neg Hx   . Kidney cancer Neg Hx     Social History   Socioeconomic History  . Marital status: Married    Spouse name: Nada Boozer  . Number of children: 2  . Years of education: 35  . Highest education level: Some college, no degree  Occupational History  . Not on file  Tobacco Use  . Smoking status: Never Smoker  . Smokeless tobacco: Never Used  Vaping Use  . Vaping Use: Never used  Substance and Sexual Activity  . Alcohol use: Yes    Comment: occasional wine  . Drug use: No  . Sexual activity: Not on file  Other Topics Concern  . Not on file  Social History Narrative  . Not on file   Social Determinants of Health   Financial Resource Strain:   . Difficulty of Paying Living Expenses: Not on file  Food Insecurity:   . Worried About Charity fundraiser in the Last Year: Not on file  . Ran Out of Food in the Last Year: Not on file  Transportation Needs:   . Lack of Transportation (Medical): Not on file  . Lack of Transportation (Non-Medical): Not on file  Physical Activity:   . Days of Exercise per Week: Not on file  . Minutes of Exercise per Session: Not on file  Stress:   . Feeling of Stress : Not on file  Social Connections:   . Frequency of Communication with Friends and Family: Not on file  . Frequency of Social Gatherings with Friends and Family: Not on file  . Attends Religious Services: Not on file  . Active Member of Clubs or Organizations: Not on file  . Attends Archivist Meetings: Not on file  . Marital Status: Not on file  Intimate Partner Violence:   . Fear of Current or Ex-Partner: Not on file  . Emotionally Abused: Not on file  . Physically Abused: Not on file  . Sexually Abused: Not on file      Review of Systems  Constitutional: Negative for chills, fatigue and  unexpected weight change.  HENT: Negative for congestion, rhinorrhea, sneezing and sore throat.   Eyes: Negative for photophobia, pain and redness.  Respiratory: Positive for cough and shortness of breath. Negative for chest tightness.   Cardiovascular: Negative for chest pain and palpitations.  Gastrointestinal: Negative for abdominal pain, constipation, diarrhea, nausea and vomiting.  Endocrine: Negative.   Genitourinary: Negative for dysuria and frequency.  Musculoskeletal: Negative for arthralgias, back pain, joint swelling and neck pain.  Skin: Negative for rash.  Allergic/Immunologic: Negative.   Neurological: Negative for tremors and numbness.  Hematological: Negative for adenopathy. Does not bruise/bleed easily.  Psychiatric/Behavioral: Negative for behavioral problems and sleep disturbance. The patient is not nervous/anxious.  Vital Signs: Resp 16   Ht 5\' 3"  (1.6 m)   Wt 163 lb (73.9 kg)   BMI 28.87 kg/m    Observation/Objective:  Well appearing, NAD noted.    Assessment/Plan: 1. Acute non-recurrent frontal sinusitis Advised patient to take entire course of antibiotics as prescribed with food. Pt should return to clinic in 7-10 days if symptoms fail to improve or new symptoms develop.  Call office if symptoms persist or worsen.  - azithromycin (ZITHROMAX) 250 MG tablet; Take as directed  Dispense: 6 tablet; Refill: 0  2. COVID-19 Continue to treat symptoms as directed.  Call contact for evaluation of antibody infusion.   General Counseling: marvin maenza understanding of the findings of today's phone visit and agrees with plan of treatment. I have discussed any further diagnostic evaluation that may be needed or ordered today. We also reviewed her medications today. she has been encouraged to call the office with any questions or concerns that should arise related to todays visit.    No orders of the defined types were placed in this encounter.   Meds  ordered this encounter  Medications  . azithromycin (ZITHROMAX) 250 MG tablet    Sig: Take as directed    Dispense:  6 tablet    Refill:  0    Time spent: [place] AGNP-C Internal medicine

## 2020-05-26 ENCOUNTER — Inpatient Hospital Stay: Admission: RE | Admit: 2020-05-26 | Payer: PPO | Source: Ambulatory Visit

## 2020-05-28 ENCOUNTER — Other Ambulatory Visit: Payer: PPO

## 2020-05-28 ENCOUNTER — Encounter: Admission: RE | Payer: Self-pay | Source: Home / Self Care

## 2020-05-28 ENCOUNTER — Ambulatory Visit: Admission: RE | Admit: 2020-05-28 | Payer: PPO | Source: Home / Self Care | Admitting: General Surgery

## 2020-05-28 DIAGNOSIS — Z20822 Contact with and (suspected) exposure to covid-19: Secondary | ICD-10-CM | POA: Diagnosis not present

## 2020-05-28 SURGERY — ESOPHAGOGASTRODUODENOSCOPY (EGD) WITH PROPOFOL
Anesthesia: General

## 2020-05-29 LAB — NOVEL CORONAVIRUS, NAA: SARS-CoV-2, NAA: NOT DETECTED

## 2020-05-29 LAB — SPECIMEN STATUS REPORT

## 2020-05-29 LAB — SARS-COV-2, NAA 2 DAY TAT

## 2020-06-18 ENCOUNTER — Other Ambulatory Visit: Payer: PPO

## 2020-06-20 ENCOUNTER — Ambulatory Visit
Admission: RE | Admit: 2020-06-20 | Discharge: 2020-06-20 | Disposition: A | Discharge status: Home / Self Care | Payer: PPO | Attending: General Surgery | Admitting: General Surgery

## 2020-06-20 ENCOUNTER — Ambulatory Visit: Payer: PPO | Admitting: Anesthesiology

## 2020-06-20 ENCOUNTER — Encounter
Admission: RE | Disposition: A | Discharge status: Home / Self Care | Payer: Self-pay | Source: Home / Self Care | Attending: General Surgery

## 2020-06-20 ENCOUNTER — Other Ambulatory Visit: Payer: Self-pay

## 2020-06-20 DIAGNOSIS — K317 Polyp of stomach and duodenum: Secondary | ICD-10-CM | POA: Diagnosis not present

## 2020-06-20 DIAGNOSIS — Z886 Allergy status to analgesic agent status: Secondary | ICD-10-CM | POA: Insufficient documentation

## 2020-06-20 DIAGNOSIS — Z880 Allergy status to penicillin: Secondary | ICD-10-CM | POA: Diagnosis not present

## 2020-06-20 DIAGNOSIS — K5790 Diverticulosis of intestine, part unspecified, without perforation or abscess without bleeding: Secondary | ICD-10-CM | POA: Diagnosis not present

## 2020-06-20 DIAGNOSIS — Z881 Allergy status to other antibiotic agents status: Secondary | ICD-10-CM | POA: Insufficient documentation

## 2020-06-20 DIAGNOSIS — Z79890 Hormone replacement therapy (postmenopausal): Secondary | ICD-10-CM | POA: Diagnosis not present

## 2020-06-20 DIAGNOSIS — Z79899 Other long term (current) drug therapy: Secondary | ICD-10-CM | POA: Insufficient documentation

## 2020-06-20 DIAGNOSIS — K5730 Diverticulosis of large intestine without perforation or abscess without bleeding: Secondary | ICD-10-CM | POA: Insufficient documentation

## 2020-06-20 DIAGNOSIS — K219 Gastro-esophageal reflux disease without esophagitis: Secondary | ICD-10-CM | POA: Diagnosis not present

## 2020-06-20 DIAGNOSIS — K921 Melena: Secondary | ICD-10-CM | POA: Insufficient documentation

## 2020-06-20 DIAGNOSIS — Z7901 Long term (current) use of anticoagulants: Secondary | ICD-10-CM | POA: Diagnosis not present

## 2020-06-20 DIAGNOSIS — K5792 Diverticulitis of intestine, part unspecified, without perforation or abscess without bleeding: Secondary | ICD-10-CM | POA: Diagnosis not present

## 2020-06-20 DIAGNOSIS — K449 Diaphragmatic hernia without obstruction or gangrene: Secondary | ICD-10-CM | POA: Insufficient documentation

## 2020-06-20 HISTORY — PX: COLONOSCOPY WITH PROPOFOL: SHX5780

## 2020-06-20 HISTORY — PX: ESOPHAGOGASTRODUODENOSCOPY (EGD) WITH PROPOFOL: SHX5813

## 2020-06-20 SURGERY — COLONOSCOPY WITH PROPOFOL
Anesthesia: General

## 2020-06-20 MED ORDER — MIDAZOLAM HCL 2 MG/2ML IJ SOLN
INTRAMUSCULAR | Status: AC
  Filled 2020-06-20: qty 2

## 2020-06-20 MED ORDER — FENTANYL CITRATE (PF) 100 MCG/2ML IJ SOLN
INTRAMUSCULAR | Status: AC
  Filled 2020-06-20: qty 2

## 2020-06-20 MED ORDER — LIDOCAINE HCL (PF) 2 % IJ SOLN
INTRAMUSCULAR | Status: DC | PRN
  Administered 2020-06-20: 60 mg

## 2020-06-20 MED ORDER — SODIUM CHLORIDE 0.9 % IV SOLN
INTRAVENOUS | Status: DC
  Administered 2020-06-20: 20 mL/h via INTRAVENOUS

## 2020-06-20 MED ORDER — PHENYLEPHRINE HCL (PRESSORS) 10 MG/ML IV SOLN
INTRAVENOUS | Status: AC
  Filled 2020-06-20: qty 1

## 2020-06-20 MED ORDER — LIDOCAINE HCL (PF) 2 % IJ SOLN
INTRAMUSCULAR | Status: AC
  Filled 2020-06-20: qty 5

## 2020-06-20 MED ORDER — PROPOFOL 10 MG/ML IV BOLUS
INTRAVENOUS | Status: DC | PRN
  Administered 2020-06-20: 20 mg via INTRAVENOUS
  Administered 2020-06-20: 30 mg via INTRAVENOUS

## 2020-06-20 MED ORDER — PROPOFOL 500 MG/50ML IV EMUL
INTRAVENOUS | Status: DC | PRN
  Administered 2020-06-20: 50 ug/kg/min via INTRAVENOUS

## 2020-06-20 MED ORDER — MIDAZOLAM HCL 5 MG/5ML IJ SOLN
INTRAMUSCULAR | Status: DC | PRN
  Administered 2020-06-20: 2 mg via INTRAVENOUS

## 2020-06-20 MED ORDER — FENTANYL CITRATE (PF) 100 MCG/2ML IJ SOLN
INTRAMUSCULAR | Status: DC | PRN
  Administered 2020-06-20 (×4): 25 ug via INTRAVENOUS

## 2020-06-20 MED ORDER — PROPOFOL 500 MG/50ML IV EMUL
INTRAVENOUS | Status: AC
  Filled 2020-06-20: qty 50

## 2020-06-20 NOTE — Anesthesia Postprocedure Evaluation (Signed)
Anesthesia Post Note  Patient: Marissa Wilson  Procedure(s) Performed: COLONOSCOPY WITH PROPOFOL (N/A ) ESOPHAGOGASTRODUODENOSCOPY (EGD) WITH PROPOFOL (N/A )  Patient location during evaluation: Endoscopy Anesthesia Type: General Level of consciousness: awake and alert and oriented Pain management: pain level controlled Vital Signs Assessment: post-procedure vital signs reviewed and stable Respiratory status: spontaneous breathing, nonlabored ventilation and respiratory function stable Cardiovascular status: blood pressure returned to baseline and stable Postop Assessment: no signs of nausea or vomiting Anesthetic complications: no   No complications documented.   Last Vitals:  Vitals:   06/20/20 0831 06/20/20 0841  BP:  113/77  Pulse: 61 60  Resp: 16 16  Temp:    SpO2: 94% 95%    Last Pain:  Vitals:   06/20/20 0841  TempSrc:   PainSc: 0-No pain                 Thao Vanover

## 2020-06-20 NOTE — H&P (Signed)
Marissa Wilson [id number] [birth year]     HPI:  Episode of black stools in September, as well as suspected diverticulitis.  Original endoscopy postponed secondary to Covid infection.  Tolerated prep well.    Medications Prior to Admission  Medication Sig Dispense Refill Last Dose  . acetaminophen (TYLENOL) 650 MG CR tablet Take 650 mg every 6 (six) hours by mouth.   Past Week at Unknown time  . Ascorbic Acid (VITAMIN C PO) Take 500 mg by mouth daily.   Past Week at Unknown time  . azithromycin (ZITHROMAX) 250 MG tablet Take as directed 6 tablet 0 Past Week at Unknown time  . Cholecalciferol 1000 units capsule Take 1,000 Units daily by mouth.   Past Week at Unknown time  . ciprofloxacin (CIPRO) 500 MG tablet Take 1 tablet (500 mg total) by mouth 2 (two) times daily. 14 tablet 0 Past Week at Unknown time  . conjugated estrogens (PREMARIN) vaginal cream Place 1 Applicatorful vaginally daily. Use pea sized amount M-W-Fr before bedtime 42.5 g 12 Past Week at Unknown time  . HYDROcodone-acetaminophen (NORCO/VICODIN) 5-325 MG tablet Take 1 tablet by mouth every 6 (six) hours as needed for moderate pain or severe pain. 30 tablet 0 Past Week at Unknown time  . levofloxacin (LEVAQUIN) 500 MG tablet Take 1 tablet (500 mg total) by mouth daily. 7 tablet 0 Past Week at Unknown time  . levothyroxine (SYNTHROID) 75 MCG tablet TAKE 1 TABLET BY MOUTH EVERY DAY 90 tablet 1 06/19/2020 at Unknown time  . metroNIDAZOLE (FLAGYL) 500 MG tablet Take 1 tablet (500 mg total) by mouth 2 (two) times daily. 14 tablet 0 Past Week at Unknown time  . montelukast (SINGULAIR) 10 MG tablet TAKE 1 TABLET BY MOUTH EVERYDAY AT BEDTIME 90 tablet 3 Past Week at Unknown time  . omeprazole (PRILOSEC) 40 MG capsule TAKE 1 CAPSULE BY MOUTH EVERY DAY 90 capsule 4 Past Week at Unknown time  . rivaroxaban (XARELTO) 10 MG TABS tablet Take 10 mg by mouth at bedtime.   Past Week at Unknown time  . triamterene-hydrochlorothiazide (DYAZIDE)  37.5-25 MG capsule TAKE 1 CAPSULE BY MOUTH IN THE MORNING FOR FLUID 90 capsule 3 06/19/2020 at Unknown time  . vitamin k 100 MCG tablet Take 100 mcg by mouth daily.   Past Week at Unknown time   Allergies  Allergen Reactions  . Amoxicillin   . Ampicillin Diarrhea    CDC  . Cefazolin Diarrhea    Other reaction(s): Other (comments) cdc CDC  . Nsaids   . Penicillins Diarrhea    Other reaction(s): Other (comments)  CDC CDC CDC    Past Medical History:  Diagnosis Date  . Asthma   . Clotting disorder ([place])   . Gastrointestinal disorder   . GERD (gastroesophageal reflux disease)   . Hx of melanoma excision   . Hyperlipidemia, unspecified   . Hypertension   . Hypothyroid   . Osteoarthritis   . Sleep apnea   . Thyroid disease   . UTI (urinary tract infection)    Past Surgical History:  Procedure Laterality Date  . ABDOMINAL HYSTERECTOMY    . PR ENDOVENOUS LASTER, 1ST VEIN Right 06/09/2017   Procedure: EVLA of Right GSV; Surgeon: Lauretta Grill, MD; Location: MAIN OR [facility]; Service: Vascular  . PR LIGATN LONG SAPHENOUS VEIN AT SEPH-FEM JUNC Right 06/09/2017   Procedure: LIGATION & DIVISION OF LONG SAPHENOUS VEIN AT SAPHENOFEMORAL JUNCTION, OR DISTAL INTERRUPTIONS; Surgeon: Lauretta Grill, MD; Location: MAIN OR [facility];  Service: Vascular  . PR PHLEB VEINS EXTREM TO 20 Right 06/09/2017   Procedure: STAB PHLEBECTOMY OF VARICOSE VEINS, 1 EXTREMITY: 10-20 STAB INCISIONS; Surgeon: Lauretta Grill, MD; Location: MAIN OR [facility]; Service: Vascular   Social History   Socioeconomic History  . Marital status: Married    Spouse name: Nada Boozer  . Number of children: 2  . Years of education: 41  . Highest education level: Some college, no degree  Occupational History  . Not on file  Tobacco Use  . Smoking status: Never Smoker  . Smokeless tobacco: Never Used  Vaping Use  . Vaping Use: Never used  Substance and Sexual Activity  . Alcohol use: Yes    Comment: occasional  wine  . Drug use: No  . Sexual activity: Not on file  Other Topics Concern  . Not on file  Social History Narrative  . Not on file   Social Determinants of Health   Financial Resource Strain:   . Difficulty of Paying Living Expenses: Not on file  Food Insecurity:   . Worried About Charity fundraiser in the Last Year: Not on file  . Ran Out of Food in the Last Year: Not on file  Transportation Needs:   . Lack of Transportation (Medical): Not on file  . Lack of Transportation (Non-Medical): Not on file  Physical Activity:   . Days of Exercise per Week: Not on file  . Minutes of Exercise per Session: Not on file  Stress:   . Feeling of Stress : Not on file  Social Connections:   . Frequency of Communication with Friends and Family: Not on file  . Frequency of Social Gatherings with Friends and Family: Not on file  . Attends Religious Services: Not on file  . Active Member of Clubs or Organizations: Not on file  . Attends Archivist Meetings: Not on file  . Marital Status: Not on file  Intimate Partner Violence:   . Fear of Current or Ex-Partner: Not on file  . Emotionally Abused: Not on file  . Physically Abused: Not on file  . Sexually Abused: Not on file   Social History   Social History Narrative  . Not on file     ROS: Negative.     PE: HEENT: Negative. Lungs: Clear. Cardio: RR. Extrem: No edema.    Assessment/Plan:  Proceed with planned endoscopy.   Forest Gleason [facility] 06/20/2020

## 2020-06-20 NOTE — Op Note (Signed)
[facility] Gastroenterology Patient Name: Marissa Wilson Procedure Date: 06/20/2020 7:26 AM MRN: [id number] Account #: [id number] Date of Birth: February 02, 1946 Admit Type: Outpatient Age: 74 Room: [facility] ENDO ROOM 2 Gender: Female Note Status: Finalized Procedure:             Upper GI endoscopy Indications:           Melena Providers:             Robert Bellow, MD Referring MD:          Lavera Guise, MD (Referring MD) Medicines:             Monitored Anesthesia Care Complications:         No immediate complications. Procedure:             Pre-Anesthesia Assessment:                        - Prior to the procedure, a History and Physical was                         performed, and patient medications, allergies and                         sensitivities were reviewed. The patient's tolerance                         of previous anesthesia was reviewed.                        - The risks and benefits of the procedure and the                         sedation options and risks were discussed with the                         patient. All questions were answered and informed                         consent was obtained.                        After obtaining informed consent, the endoscope was                         passed under direct vision. Throughout the procedure,                         the patient's blood pressure, pulse, and oxygen                         saturations were monitored continuously. The Endoscope                         was introduced through the mouth, and advanced to the                         second part of duodenum. The upper GI endoscopy was  accomplished without difficulty. The patient tolerated                         the procedure well. Findings:      A medium-sized hiatal hernia was present.      A few 5 mm sessile polyps with no bleeding and no stigmata of recent       bleeding were found in the cardia and in the  gastric fundus.      The examined duodenum was normal. Impression:            - Medium-sized hiatal hernia.                        - A few gastric polyps.                        - Normal examined duodenum.                        - No specimens collected. Recommendation:        - Perform a colonoscopy today. Procedure Code(s):     --- Professional ---                        [phone], Esophagogastroduodenoscopy, flexible,                         transoral; diagnostic, including collection of                         specimen(s) by brushing or washing, when performed                         (separate procedure) Diagnosis Code(s):     --- Professional ---                        K44.9, Diaphragmatic hernia without obstruction or                         gangrene                        K31.7, Polyp of stomach and duodenum                        K92.1, Melena (includes Hematochezia) CPT copyright 2019 American Medical Association. All rights reserved. The codes documented in this report are preliminary and upon coder review may  be revised to meet current compliance requirements. Robert Bellow, MD 06/20/2020 7:48:26 AM This report has been signed electronically. Number of Addenda: 0 Note Initiated On: 06/20/2020 7:26 AM      [facility]

## 2020-06-20 NOTE — Op Note (Signed)
[facility] Gastroenterology Patient Name: Marissa Wilson Procedure Date: 06/20/2020 7:25 AM MRN: [id number] Account #: [id number] Date of Birth: 05-10-1946 Admit Type: Outpatient Age: 74 Room: [facility] ENDO ROOM 2 Gender: Female Note Status: Finalized Procedure:             Colonoscopy Indications:           Melena Providers:             Robert Bellow, MD Referring MD:          Lavera Guise, MD (Referring MD) Medicines:             Monitored Anesthesia Care Complications:         No immediate complications. Procedure:             Pre-Anesthesia Assessment:                        - Prior to the procedure, a History and Physical was                         performed, and patient medications, allergies and                         sensitivities were reviewed. The patient's tolerance                         of previous anesthesia was reviewed.                        - The risks and benefits of the procedure and the                         sedation options and risks were discussed with the                         patient. All questions were answered and informed                         consent was obtained.                        After obtaining informed consent, the colonoscope was                         passed under direct vision. Throughout the procedure,                         the patient's blood pressure, pulse, and oxygen                         saturations were monitored continuously. The                         Colonoscope was introduced through the anus and                         advanced to the the cecum, identified by appendiceal                         orifice and ileocecal valve. The colonoscopy  was                         somewhat difficult due to multiple diverticula in the                         colon. Successful completion of the procedure was                         aided by applying abdominal pressure. The patient                          tolerated the procedure well. The quality of the bowel                         preparation was excellent. Findings:      Many small and large-mouthed diverticula were found in the recto-sigmoid       colon, sigmoid colon and descending colon.      The retroflexed view of the distal rectum and anal verge was normal and       showed no anal or rectal abnormalities. Impression:            - Diverticulosis in the recto-sigmoid colon, in the                         sigmoid colon and in the descending colon.                        - The distal rectum and anal verge are normal on                         retroflexion view.                        - No specimens collected. Recommendation:        - Discharge patient to home (via wheelchair). Procedure Code(s):     --- Professional ---                        [phone], Colonoscopy, flexible; diagnostic, including                         collection of specimen(s) by brushing or washing, when                         performed (separate procedure) Diagnosis Code(s):     --- Professional ---                        K92.1, Melena (includes Hematochezia)                        K57.30, Diverticulosis of large intestine without                         perforation or abscess without bleeding CPT copyright 2019 American Medical Association. All rights reserved. The codes documented in this report are preliminary and upon coder review may  be revised to meet current compliance requirements. Robert Bellow, MD 06/20/2020 8:09:05 AM This report has been signed electronically. Number  of Addenda: 0 Note Initiated On: 06/20/2020 7:25 AM Scope Withdrawal Time: 0 hours 7 minutes 54 seconds  Total Procedure Duration: 0 hours 17 minutes 10 seconds       [facility]

## 2020-06-20 NOTE — Transfer of Care (Signed)
Immediate Anesthesia Transfer of Care Note  Patient: Marissa Wilson  Procedure(s) Performed: COLONOSCOPY WITH PROPOFOL (N/A ) ESOPHAGOGASTRODUODENOSCOPY (EGD) WITH PROPOFOL (N/A )  Patient Location: PACU  Anesthesia Type:General  Level of Consciousness: sedated  Airway & Oxygen Therapy: Patient Spontanous Breathing  Post-op Assessment: Report given to RN and Post -op Vital signs reviewed and stable  Post vital signs: Reviewed and stable  Last Vitals:  Vitals Value Taken Time  BP 109/54 06/20/20 0811  Temp    Pulse 70 06/20/20 0812  Resp 17 06/20/20 0812  SpO2 92 % 06/20/20 0812  Vitals shown include unvalidated device data.  Last Pain:  Vitals:   06/20/20 0811  TempSrc:   PainSc: 0-No pain         Complications: No complications documented.

## 2020-06-20 NOTE — Anesthesia Preprocedure Evaluation (Signed)
Anesthesia Evaluation  Patient identified by MRN, date of birth, ID band Patient awake    Reviewed: Allergy & Precautions, NPO status , Patient's Chart, lab work & pertinent test results  History of Anesthesia Complications Negative for: history of anesthetic complications  Airway Mallampati: III  TM Distance: >3 FB Neck ROM: Full    Dental no notable dental hx.    Pulmonary asthma (mild intermittent) , neg sleep apnea,    breath sounds clear to auscultation- rhonchi (-) wheezing      Cardiovascular Exercise Tolerance: Good hypertension, Pt. on medications (-) CAD, (-) Past MI, (-) Cardiac Stents and (-) CABG  Rhythm:Regular Rate:Normal - Systolic murmurs and - Diastolic murmurs    Neuro/Psych neg Seizures negative neurological ROS  negative psych ROS   GI/Hepatic Neg liver ROS, GERD  ,  Endo/Other  neg diabetesHypothyroidism   Renal/GU negative Renal ROS     Musculoskeletal  (+) Arthritis ,   Abdominal (+) - obese,   Peds  Hematology negative hematology ROS (+)   Anesthesia Other Findings Past Medical History: No date: Asthma No date: Clotting disorder (HCC) No date: Gastrointestinal disorder No date: GERD (gastroesophageal reflux disease) No date: Hx of melanoma excision No date: Hyperlipidemia, unspecified No date: Hypertension No date: Hypothyroid No date: Osteoarthritis No date: Sleep apnea No date: Thyroid disease No date: UTI (urinary tract infection)   Reproductive/Obstetrics                             Anesthesia Physical Anesthesia Plan  ASA: II  Anesthesia Plan: General   Post-op Pain Management:    Induction: Intravenous  PONV Risk Score and Plan: 2 and Propofol infusion  Airway Management Planned: Natural Airway  Additional Equipment:   Intra-op Plan:   Post-operative Plan:   Informed Consent: I have reviewed the patients History and Physical, chart,  labs and discussed the procedure including the risks, benefits and alternatives for the proposed anesthesia with the patient or authorized representative who has indicated his/her understanding and acceptance.     Dental advisory given  Plan Discussed with: CRNA and Anesthesiologist  Anesthesia Plan Comments:         Anesthesia Quick Evaluation

## 2020-06-23 ENCOUNTER — Encounter: Payer: Self-pay | Admitting: General Surgery

## 2020-07-23 ENCOUNTER — Other Ambulatory Visit: Payer: Self-pay | Admitting: Internal Medicine

## 2020-07-24 DIAGNOSIS — I8010 Phlebitis and thrombophlebitis of unspecified femoral vein: Secondary | ICD-10-CM | POA: Diagnosis not present

## 2020-07-28 ENCOUNTER — Other Ambulatory Visit: Payer: Self-pay

## 2020-07-28 MED ORDER — OMEPRAZOLE 40 MG PO CPDR
40.0000 mg | DELAYED_RELEASE_CAPSULE | Freq: Every day | ORAL | 1 refills | Status: DC
Start: 2020-07-28 — End: 2020-12-03

## 2020-08-28 ENCOUNTER — Other Ambulatory Visit: Payer: Self-pay

## 2020-08-28 ENCOUNTER — Encounter: Payer: Self-pay | Admitting: Internal Medicine

## 2020-08-28 ENCOUNTER — Ambulatory Visit (INDEPENDENT_AMBULATORY_CARE_PROVIDER_SITE_OTHER): Payer: PPO | Admitting: Internal Medicine

## 2020-08-28 VITALS — BP 130/76 | HR 70 | Temp 97.6°F | Resp 16 | Ht 62.5 in | Wt 156.2 lb

## 2020-08-28 DIAGNOSIS — R300 Dysuria: Secondary | ICD-10-CM

## 2020-08-28 DIAGNOSIS — K219 Gastro-esophageal reflux disease without esophagitis: Secondary | ICD-10-CM

## 2020-08-28 DIAGNOSIS — Z0001 Encounter for general adult medical examination with abnormal findings: Secondary | ICD-10-CM

## 2020-08-28 DIAGNOSIS — I10 Essential (primary) hypertension: Secondary | ICD-10-CM | POA: Diagnosis not present

## 2020-08-28 DIAGNOSIS — E039 Hypothyroidism, unspecified: Secondary | ICD-10-CM | POA: Diagnosis not present

## 2020-08-28 DIAGNOSIS — E782 Mixed hyperlipidemia: Secondary | ICD-10-CM | POA: Diagnosis not present

## 2020-08-28 DIAGNOSIS — I82812 Embolism and thrombosis of superficial veins of left lower extremities: Secondary | ICD-10-CM | POA: Diagnosis not present

## 2020-08-28 DIAGNOSIS — Z124 Encounter for screening for malignant neoplasm of cervix: Secondary | ICD-10-CM | POA: Diagnosis not present

## 2020-08-28 NOTE — Progress Notes (Signed)
[facility] [place], [place] [address]  Internal MEDICINE  Office Visit Note  Patient Name: Marissa Wilson  [id number]  [id number]  Date of Service: 09/02/2020  Chief Complaint  Patient presents with  . Medicare Wellness  . Gastroesophageal Reflux  . Sleep Apnea  . Hyperlipidemia  . Hypertension  . Asthma  . Quality Metric Gaps    Eye exam scheduled in Feb.,  PNA     HPI Pt is here for routine health maintenance examination. Patient is doing well. Her sister just found she has Parkinson's disease, so that has been stressful for her, but she is managing well. She does report a few instances of difficulty opening her L eyelid over the past month along with what she feels are more noticeable veins on that side. She does state she sometimes sleeps with he arm pressed against her eye and feels like she has dry eyes for which she may trial eye drops. Eye exam is scheduled for Feb. She reports no headaches or pain associated with it and it has only happened a few times. She denies any numbness or tingling anywhere on face or any vision changes. Notes occasional reflux for which she takes Prilosec. Trying to eat well and keep up activity. She is doing [employer] meals. Patient had he colonoscopy in Nov 2021 which showed diverticulitis. She is followed by GI. She also states she had an incidental finding of pancreatic cyst on previous CT for which she had an abdominal MRI for further investigation. 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 Byrnett in 6 months.    Current Medication: Outpatient Encounter Medications as of 08/28/2020  Medication Sig  . acetaminophen (TYLENOL) 650 MG CR tablet Take 650 mg every 6 (six) hours by mouth.  . Ascorbic Acid (VITAMIN C PO) Take 500 mg by mouth daily.  Marland Kitchen azithromycin (ZITHROMAX) 250 MG tablet Take as directed  . Cholecalciferol 1000 units capsule Take 1,000 Units daily by mouth.  . ciprofloxacin  (CIPRO) 500 MG tablet Take 1 tablet (500 mg total) by mouth 2 (two) times daily.  Marland Kitchen conjugated estrogens (PREMARIN) vaginal cream Place 1 Applicatorful vaginally daily. Use pea sized amount M-W-Fr before bedtime  . HYDROcodone-acetaminophen (NORCO/VICODIN) 5-325 MG tablet Take 1 tablet by mouth every 6 (six) hours as needed for moderate pain or severe pain.  Marland Kitchen levofloxacin (LEVAQUIN) 500 MG tablet Take 1 tablet (500 mg total) by mouth daily.  Marland Kitchen levothyroxine (SYNTHROID) 75 MCG tablet TAKE 1 TABLET BY MOUTH EVERY DAY  . metroNIDAZOLE (FLAGYL) 500 MG tablet Take 1 tablet (500 mg total) by mouth 2 (two) times daily.  . montelukast (SINGULAIR) 10 MG tablet TAKE 1 TABLET BY MOUTH EVERYDAY AT BEDTIME  . omeprazole (PRILOSEC) 40 MG capsule Take 1 capsule (40 mg total) by mouth daily.  . rivaroxaban (XARELTO) 10 MG TABS tablet Take 10 mg by mouth at bedtime.  . triamterene-hydrochlorothiazide (DYAZIDE) 37.5-25 MG capsule TAKE 1 CAPSULE BY MOUTH IN THE MORNING FOR FLUID  . vitamin k 100 MCG tablet Take 100 mcg by mouth daily.   No facility-administered encounter medications on file as of 08/28/2020.    Surgical History: Past Surgical History:  Procedure Laterality Date  . ABDOMINAL HYSTERECTOMY    . COLONOSCOPY WITH PROPOFOL N/A 06/20/2020   Procedure: COLONOSCOPY WITH PROPOFOL;  Surgeon: Robert Bellow, MD;  Location: [facility] ENDOSCOPY;  Service: Endoscopy;  Laterality: N/A;  . ESOPHAGOGASTRODUODENOSCOPY (EGD) WITH PROPOFOL N/A 06/20/2020  Procedure: ESOPHAGOGASTRODUODENOSCOPY (EGD) WITH PROPOFOL;  Surgeon: Robert Bellow, MD;  Location: [facility] ENDOSCOPY;  Service: Endoscopy;  Laterality: N/A;  COVID POSITIVE 05/19/20  . PR ENDOVENOUS LASTER, 1ST VEIN Right 06/09/2017   Procedure: EVLA of Right GSV; Surgeon: Lauretta Grill, MD; Location: MAIN OR [facility]; Service: Vascular  . PR LIGATN LONG SAPHENOUS VEIN AT SEPH-FEM JUNC Right 06/09/2017   Procedure: LIGATION & DIVISION OF LONG SAPHENOUS VEIN  AT SAPHENOFEMORAL JUNCTION, OR DISTAL INTERRUPTIONS; Surgeon: Lauretta Grill, MD; Location: MAIN OR [facility]; Service: Vascular  . PR PHLEB VEINS EXTREM TO 20 Right 06/09/2017   Procedure: STAB PHLEBECTOMY OF VARICOSE VEINS, 1 EXTREMITY: 10-20 STAB INCISIONS; Surgeon: Lauretta Grill, MD; Location: MAIN OR [facility]; Service: Vascular    Medical History: Past Medical History:  Diagnosis Date  . Asthma   . Clotting disorder ([place])   . Gastrointestinal disorder   . GERD (gastroesophageal reflux disease)   . Hx of melanoma excision   . Hyperlipidemia, unspecified   . Hypertension   . Hypothyroid   . Osteoarthritis   . Sleep apnea   . Thyroid disease   . UTI (urinary tract infection)     Family History: Family History  Problem Relation Age of Onset  . Breast cancer Mother 8  . Varicose Veins Mother   . Lung cancer Father   . Heart attack Father   . Bladder Cancer Neg Hx   . Kidney cancer Neg Hx       Review of Systems  Constitutional: Negative for activity change, appetite change, chills, diaphoresis, fatigue, fever and unexpected weight change.  HENT: Negative for congestion, ear discharge, ear pain, facial swelling, hearing loss, nosebleeds, postnasal drip, rhinorrhea, sinus pressure, sinus pain, sneezing, sore throat, tinnitus, trouble swallowing and voice change.   Eyes: Negative for photophobia, pain, discharge, redness, itching and visual disturbance.  Respiratory: Negative for apnea, cough, choking, chest tightness, shortness of breath, wheezing and stridor.   Cardiovascular: Negative for chest pain, palpitations and leg swelling.  Gastrointestinal: Negative for abdominal distention, abdominal pain, anal bleeding, constipation, diarrhea, nausea and rectal pain.  Endocrine: Negative for cold intolerance, heat intolerance, polydipsia, polyphagia and polyuria.  Genitourinary: Negative for difficulty urinating, flank pain, frequency, genital sores, hematuria, menstrual  problem, pelvic pain, urgency, vaginal bleeding, vaginal discharge and vaginal pain.  Musculoskeletal: Negative for arthralgias, back pain, gait problem, joint swelling, myalgias and neck pain.  Skin: Negative for color change, pallor, rash and wound.  Allergic/Immunologic: Negative for environmental allergies, food allergies and immunocompromised state.  Neurological: Negative for dizziness, seizures, syncope, facial asymmetry, speech difficulty, weakness, light-headedness, numbness and headaches.  Hematological: Negative for adenopathy. Does not bruise/bleed easily.  Psychiatric/Behavioral: Negative for agitation, behavioral problems, confusion, decreased concentration, dysphoric mood, hallucinations, self-injury, sleep disturbance and suicidal ideas. The patient is not nervous/anxious and is not hyperactive.      Vital Signs: BP 130/76   Pulse 70   Temp 97.6 F (36.4 C)   Resp 16   Ht 5' 2.5" (1.588 m)   Wt 156 lb 3.2 oz (70.9 kg)   SpO2 98%   BMI 28.11 kg/m    Physical Exam Constitutional:      General: She is not in acute distress.    Appearance: She is well-developed and well-nourished. She is not diaphoretic.  HENT:     Head: Normocephalic and atraumatic.     Right Ear: External ear normal.     Left Ear: External ear normal.     Nose: Nose  normal.     Mouth/Throat:     Mouth: Oropharynx is clear and moist.     Pharynx: No oropharyngeal exudate.  Eyes:     General: No scleral icterus.       Right eye: No discharge.        Left eye: No discharge.     Extraocular Movements: Extraocular movements intact and EOM normal.     Conjunctiva/sclera: Conjunctivae normal.     Pupils: Pupils are equal, round, and reactive to light.  Neck:     Thyroid: No thyromegaly.     Vascular: No JVD.     Trachea: No tracheal deviation.  Cardiovascular:     Rate and Rhythm: Normal rate and regular rhythm.     Pulses: Intact distal pulses.     Heart sounds: Normal heart sounds. No murmur  heard. No friction rub. No gallop.   Pulmonary:     Effort: Pulmonary effort is normal. No respiratory distress.     Breath sounds: Normal breath sounds. No stridor. No wheezing or rales.  Chest:     Chest wall: No tenderness.  Abdominal:     General: Bowel sounds are normal. There is no distension.     Palpations: Abdomen is soft. There is no mass.     Tenderness: There is no abdominal tenderness. There is no guarding or rebound.  Musculoskeletal:        General: No tenderness, deformity or edema. Normal range of motion.     Cervical back: Normal range of motion and neck supple.  Lymphadenopathy:     Cervical: No cervical adenopathy.  Skin:    General: Skin is warm and dry.     Coloration: Skin is not pale.     Findings: No erythema or rash.  Neurological:     General: No focal deficit present.     Mental Status: She is alert and oriented to person, place, and time.     Cranial Nerves: No cranial nerve deficit.     Motor: No abnormal muscle tone.     Coordination: Coordination normal.     Deep Tendon Reflexes: Reflexes are normal and symmetric.  Psychiatric:        Mood and Affect: Mood and affect normal.        Behavior: Behavior normal.        Thought Content: Thought content normal.        Judgment: Judgment normal.     Assessment/Plan: 1. Encounter for general adult medical examination with abnormal findings Ordered labs  2. Hypothyroidism, unspecified type Continue current medications - TSH - T4, free  3. 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 Followed by hematology - CBC with Differential/Platelet - Comprehensive metabolic panel  4. Gastroesophageal reflux disease without esophagitis Continue current medications - CBC with Differential/Platelet  5. Mixed hyperlipidemia Continue diet and exercise - Lipid Panel With LDL/HDL Ratio  6. Essential hypertension Stable. Continue current medications  7. Dysuria - UA/M w/rflx  Culture, Routine - Urinalysis  General Counseling: Rushie verbalizes understanding of the findings of todays visit and agrees with plan of treatment. I have discussed any further diagnostic evaluation that may be needed or ordered today. We also reviewed her medications today. she has been encouraged to call the office with any questions or concerns that should arise related to todays visit.   Orders Placed This Encounter  Procedures  . Microscopic Examination  . Urine Culture, Reflex  . UA/M w/rflx Culture, Routine  .  CBC with Differential/Platelet  . Lipid Panel With LDL/HDL Ratio  . TSH  . T4, free  . Comprehensive metabolic panel  . Urinalysis     Total time spent: 30 Minutes  Time spent includes review of chart, medications, test results, and follow up plan with the patient.     Lavera Guise, MD  Internal Medicine

## 2020-09-02 LAB — UA/M W/RFLX CULTURE, ROUTINE
Bilirubin, UA: NEGATIVE
Glucose, UA: NEGATIVE
Ketones, UA: NEGATIVE
Nitrite, UA: NEGATIVE
Protein,UA: NEGATIVE
RBC, UA: NEGATIVE
Specific Gravity, UA: 1.022 (ref 1.005–1.030)
Urobilinogen, Ur: 1 mg/dL (ref 0.2–1.0)
pH, UA: 7 (ref 5.0–7.5)

## 2020-09-02 LAB — MICROSCOPIC EXAMINATION
Bacteria, UA: NONE SEEN
Casts: NONE SEEN /lpf

## 2020-09-02 LAB — URINE CULTURE, REFLEX

## 2020-09-04 ENCOUNTER — Other Ambulatory Visit: Payer: Self-pay | Admitting: Internal Medicine

## 2020-11-12 DIAGNOSIS — H25813 Combined forms of age-related cataract, bilateral: Secondary | ICD-10-CM | POA: Diagnosis not present

## 2020-12-03 ENCOUNTER — Other Ambulatory Visit: Payer: Self-pay | Admitting: Internal Medicine

## 2020-12-15 ENCOUNTER — Telehealth: Payer: Self-pay | Admitting: Internal Medicine

## 2020-12-15 NOTE — Progress Notes (Signed)
  Chronic Care Management   Note  12/15/2020 Name: Marissa Wilson MRN: [id number] DOB: Jul 03, 1946  Marissa Wilson is a 75 y.o. year old female who is a primary care patient of Lavera Guise, MD. I reached out to [employer] by phone today in response to a referral sent by [name]'s PCP, Lavera Guise, MD.   [name] was given information about Chronic Care Management services today including:  1. CCM service includes personalized support from designated clinical staff supervised by her physician, including individualized plan of care and coordination with other care providers 2. 24/7 contact phone numbers for assistance for urgent and routine care needs. 3. Service will only be billed when office clinical staff spend 20 minutes or more in a month to coordinate care. 4. Only one practitioner may furnish and bill the service in a calendar month. 5. The patient may stop CCM services at any time (effective at the end of the month) by phone call to the office staff.   Patient agreed to services and verbal consent obtained.   Follow up plan:   Carley Perdue [employer] Scheduler

## 2021-01-20 ENCOUNTER — Ambulatory Visit: Payer: PPO

## 2021-02-12 ENCOUNTER — Encounter: Payer: Self-pay | Admitting: Internal Medicine

## 2021-02-12 ENCOUNTER — Other Ambulatory Visit: Payer: Self-pay

## 2021-02-12 ENCOUNTER — Ambulatory Visit (INDEPENDENT_AMBULATORY_CARE_PROVIDER_SITE_OTHER): Payer: PPO | Admitting: Internal Medicine

## 2021-02-12 VITALS — BP 130/70 | HR 61 | Temp 97.8°F | Resp 16 | Ht 62.5 in | Wt 158.0 lb

## 2021-02-12 DIAGNOSIS — G729 Myopathy, unspecified: Secondary | ICD-10-CM

## 2021-02-12 DIAGNOSIS — I83893 Varicose veins of bilateral lower extremities with other complications: Secondary | ICD-10-CM

## 2021-02-12 DIAGNOSIS — R300 Dysuria: Secondary | ICD-10-CM

## 2021-02-12 DIAGNOSIS — E782 Mixed hyperlipidemia: Secondary | ICD-10-CM

## 2021-02-12 LAB — POCT URINALYSIS DIPSTICK
Bilirubin, UA: NEGATIVE
Blood, UA: NEGATIVE
Glucose, UA: NEGATIVE
Ketones, UA: NEGATIVE
Leukocytes, UA: NEGATIVE
Nitrite, UA: NEGATIVE
Protein, UA: NEGATIVE
Spec Grav, UA: 1.01 (ref 1.010–1.025)
Urobilinogen, UA: 0.2 E.U./dL
pH, UA: 6.5 (ref 5.0–8.0)

## 2021-02-12 MED ORDER — FUROSEMIDE 20 MG PO TABS: 20.0000 mg | ORAL_TABLET | Freq: Every day | ORAL | 3 refills | Status: DC

## 2021-02-12 NOTE — Progress Notes (Signed)
[place], [place] [address]  Internal MEDICINE  Office Visit Note  Patient Name: Marissa Wilson  [id number]  [id number]  Date of Service: 02/19/2021  Chief Complaint  Patient presents with   Follow-up   Hypertension   Hyperlipidemia   Gastroesophageal Reflux   Ankle Problem    Swelling     HPI Patient is seen for acute and sick visit she has history of hypertension, hyperlipidemia hypothyroidism and GERD she also suffers from severe superficial thrombophlebitis varicose veins does follow-up with vascular physician. She just has been working on her new job and has been on her feet, she has been complaining of swelling around her ankles and feet. She has been complaining of throbbing at night and difficulty sleeping legs feel heavy at the end of the day ,there has been redness as well Patient does have history of for recurrent UTIs and would like to have her urine tested while she is here   Current Medication: Outpatient Encounter Medications as of 02/12/2021  Medication Sig   acetaminophen (TYLENOL) 650 MG CR tablet Take 650 mg every 6 (six) hours by mouth.   Cholecalciferol 1000 units capsule Take 1,000 Units daily by mouth.   furosemide (LASIX) 20 MG tablet Take 1 tablet (20 mg total) by mouth daily.   levothyroxine (SYNTHROID) 75 MCG tablet TAKE 1 TABLET BY MOUTH EVERY DAY   montelukast (SINGULAIR) 10 MG tablet TAKE 1 TABLET BY MOUTH EVERYDAY AT BEDTIME   omeprazole (PRILOSEC) 40 MG capsule TAKE 1 CAPSULE (40 MG TOTAL) BY MOUTH DAILY.   rivaroxaban (XARELTO) 10 MG TABS tablet Take 10 mg by mouth at bedtime.   triamterene-hydrochlorothiazide (DYAZIDE) 37.5-25 MG capsule TAKE 1 CAPSULE BY MOUTH IN THE MORNING FOR FLUID   vitamin k 100 MCG tablet Take 100 mcg by mouth daily.   [DISCONTINUED] ciprofloxacin (CIPRO) 500 MG tablet Take 1 tablet (500 mg total) by mouth 2 (two) times daily.   [DISCONTINUED] Ascorbic Acid (VITAMIN C PO) Take 500  mg by mouth daily.   [DISCONTINUED] azithromycin (ZITHROMAX) 250 MG tablet Take as directed   [DISCONTINUED] conjugated estrogens (PREMARIN) vaginal cream Place 1 Applicatorful vaginally daily. Use pea sized amount M-W-Fr before bedtime   [DISCONTINUED] HYDROcodone-acetaminophen (NORCO/VICODIN) 5-325 MG tablet Take 1 tablet by mouth every 6 (six) hours as needed for moderate pain or severe pain.   [DISCONTINUED] levofloxacin (LEVAQUIN) 500 MG tablet Take 1 tablet (500 mg total) by mouth daily.   [DISCONTINUED] metroNIDAZOLE (FLAGYL) 500 MG tablet Take 1 tablet (500 mg total) by mouth 2 (two) times daily.   No facility-administered encounter medications on file as of 02/12/2021.    Surgical History: Past Surgical History:  Procedure Laterality Date   ABDOMINAL HYSTERECTOMY     COLONOSCOPY WITH PROPOFOL N/A 06/20/2020   Procedure: COLONOSCOPY WITH PROPOFOL;  Surgeon: Robert Bellow, MD;  Location: [facility] ENDOSCOPY;  Service: Endoscopy;  Laterality: N/A;   ESOPHAGOGASTRODUODENOSCOPY (EGD) WITH PROPOFOL N/A 06/20/2020   Procedure: ESOPHAGOGASTRODUODENOSCOPY (EGD) WITH PROPOFOL;  Surgeon: Robert Bellow, MD;  Location: [facility] ENDOSCOPY;  Service: Endoscopy;  Laterality: N/A;  COVID POSITIVE 05/19/20   PR ENDOVENOUS LASTER, 1ST VEIN Right 06/09/2017   Procedure: EVLA of Right GSV; Surgeon: Lauretta Grill, MD; Location: MAIN OR [facility]; Service: Vascular   PR LIGATN LONG SAPHENOUS VEIN AT SEPH-FEM JUNC Right 06/09/2017   Procedure: LIGATION & DIVISION OF LONG SAPHENOUS VEIN AT SAPHENOFEMORAL JUNCTION, OR DISTAL INTERRUPTIONS; Surgeon: Lauretta Grill, MD; Location: MAIN OR  [facility]; Service: Vascular   PR PHLEB VEINS EXTREM TO 20 Right 06/09/2017   Procedure: STAB PHLEBECTOMY OF VARICOSE VEINS, 1 EXTREMITY: 10-20 STAB INCISIONS; Surgeon: Lauretta Grill, MD; Location: MAIN OR [facility]; Service: Vascular    Medical History: Past Medical History:  Diagnosis Date   Asthma    Clotting  disorder ([place])    Gastrointestinal disorder    GERD (gastroesophageal reflux disease)    Hx of melanoma excision    Hyperlipidemia, unspecified    Hypertension    Hypothyroid    Osteoarthritis    Sleep apnea    Thyroid disease    UTI (urinary tract infection)     Family History: Family History  Problem Relation Age of Onset   Breast cancer Mother 35   Varicose Veins Mother    Lung cancer Father    Heart attack Father    Bladder Cancer Neg Hx    Kidney cancer Neg Hx     Social History   Socioeconomic History   Marital status: Married    Spouse name: Nada Boozer   Number of children: 2   Years of education: 13   Highest education level: Some college, no degree  Occupational History   Not on file  Tobacco Use   Smoking status: Never   Smokeless tobacco: Never  Vaping Use   Vaping Use: Never used  Substance and Sexual Activity   Alcohol use: Yes    Comment: occasional wine   Drug use: No   Sexual activity: Not on file  Other Topics Concern   Not on file  Social History Narrative   Not on file   Social Determinants of Health   Financial Resource Strain: Not on file  Food Insecurity: Not on file  Transportation Needs: Not on file  Physical Activity: Not on file  Stress: Not on file  Social Connections: Not on file  Intimate Partner Violence: Not on file      Review of Systems  Constitutional:  Negative for fatigue and fever.  HENT:  Negative for congestion, mouth sores and postnasal drip.   Respiratory:  Negative for cough and shortness of breath.   Cardiovascular:  Positive for leg swelling. Negative for chest pain.  Genitourinary:  Negative for flank pain.  Musculoskeletal:  Positive for arthralgias.  Skin:  Positive for color change.  Psychiatric/Behavioral: Negative.     Vital Signs: BP 130/70   Pulse 61   Temp 97.8 F (36.6 C)   Resp 16   Ht 5' 2.5" (1.588 m)   Wt 158 lb (71.7 kg)   SpO2 95%   BMI 28.44 kg/m    Physical  Exam Constitutional:      Appearance: Normal appearance.  HENT:     Head: Normocephalic and atraumatic.     Nose: Nose normal.     Mouth/Throat:     Mouth: Mucous membranes are moist.     Pharynx: No posterior oropharyngeal erythema.  Eyes:     Extraocular Movements: Extraocular movements intact.     Pupils: Pupils are equal, round, and reactive to light.  Cardiovascular:     Pulses: Normal pulses.     Heart sounds: Normal heart sounds.  Pulmonary:     Effort: Pulmonary effort is normal.     Breath sounds: Normal breath sounds.  Musculoskeletal:        General: Swelling and tenderness present.     Right lower leg: Edema present.     Left lower leg: Edema present.  Skin:  General: Skin is warm.     Findings: Erythema present.  Neurological:     General: No focal deficit present.     Mental Status: She is alert.  Psychiatric:        Mood and Affect: Mood normal.        Behavior: Behavior normal.       Assessment/Plan: 1. Varicose veins of leg with swelling, bilateral This is a worsening problem patient has severe varicosities discoloration of her skin and now edema patient is instructed that she needs to call her vascular surgeon and to be seen as soon as possible because she can get risk of DVT in the meantime we will order compression stockings, ABI are done which are negative  - Compression stockings - POCT ABI Screening Pilot No Charge - furosemide (LASIX) 20 MG tablet; Take 1 tablet (20 mg total) by mouth daily.  Dispense: 30 tablet; Refill: 3 - Basic Metabolic Panel (BMET)  2. 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 Dysuria Will check her urine for any infection - POCT Urinalysis Dipstick   General Counseling: Cheryel verbalizes understanding of the findings of todays visit and  agrees with plan of treatment. I have discussed any further diagnostic evaluation that may be needed or ordered today. We also reviewed her medications today. she has been encouraged to call the office with any questions or concerns that should arise related to todays visit.    Orders Placed This Encounter  Procedures   Compression stockings   Basic Metabolic Panel (BMET)   POCT Urinalysis Dipstick   POCT ABI Screening Pilot No Charge    Meds ordered this encounter  Medications   furosemide (LASIX) 20 MG tablet    Sig: Take 1 tablet (20 mg total) by mouth daily.    Dispense:  30 tablet    Refill:  3    Total time spent:45 Minutes Time spent includes review of chart, medications, test results, and follow up plan with the patient.   [place] Controlled Substance Database was reviewed by me.   Dr Lavera Guise Internal medicine

## 2021-02-19 DIAGNOSIS — I998 Other disorder of circulatory system: Secondary | ICD-10-CM | POA: Diagnosis not present

## 2021-02-20 ENCOUNTER — Telehealth: Payer: Self-pay

## 2021-02-20 NOTE — Telephone Encounter (Signed)
Spoke to pt and let her know will be discussed at next visit 03-05-21

## 2021-02-20 NOTE — Telephone Encounter (Signed)
For now she does not need to, wills e her on 7/21

## 2021-02-20 NOTE — Telephone Encounter (Signed)
Pt had potassium checked 02-19-21, results are 4.7, pt wants to know how frequently she will need to have potassium checked

## 2021-02-26 ENCOUNTER — Ambulatory Visit: Payer: PPO | Admitting: Physician Assistant

## 2021-03-02 DIAGNOSIS — I872 Venous insufficiency (chronic) (peripheral): Secondary | ICD-10-CM | POA: Diagnosis not present

## 2021-03-04 ENCOUNTER — Telehealth: Payer: Self-pay

## 2021-03-04 NOTE — Telephone Encounter (Signed)
Left vm to screen for 03/05/21 appointment-Toni

## 2021-03-05 ENCOUNTER — Encounter: Payer: Self-pay | Admitting: Internal Medicine

## 2021-03-05 ENCOUNTER — Other Ambulatory Visit: Payer: Self-pay | Admitting: Internal Medicine

## 2021-03-05 ENCOUNTER — Ambulatory Visit (INDEPENDENT_AMBULATORY_CARE_PROVIDER_SITE_OTHER): Payer: PPO | Admitting: Internal Medicine

## 2021-03-05 ENCOUNTER — Other Ambulatory Visit: Payer: Self-pay

## 2021-03-05 DIAGNOSIS — J3089 Other allergic rhinitis: Secondary | ICD-10-CM

## 2021-03-05 DIAGNOSIS — R600 Localized edema: Secondary | ICD-10-CM | POA: Diagnosis not present

## 2021-03-05 DIAGNOSIS — Z1231 Encounter for screening mammogram for malignant neoplasm of breast: Secondary | ICD-10-CM

## 2021-03-05 DIAGNOSIS — J0101 Acute recurrent maxillary sinusitis: Secondary | ICD-10-CM

## 2021-03-05 MED ORDER — TRIAMCINOLONE ACETONIDE 55 MCG/ACT NA AERO: 2.0000 | INHALATION_SPRAY | Freq: Every day | NASAL | 12 refills | Status: DC

## 2021-03-05 MED ORDER — AZITHROMYCIN 250 MG PO TABS: ORAL_TABLET | ORAL | 0 refills | Status: DC

## 2021-03-05 MED ORDER — TETANUS-DIPHTH-ACELL PERTUSSIS 5-2.5-18.5 LF-MCG/0.5 IM SUSY: 0.5000 mL | PREFILLED_SYRINGE | Freq: Once | INTRAMUSCULAR | 0 refills | Status: AC

## 2021-03-05 MED ORDER — CETIRIZINE HCL 10 MG PO TABS
10.0000 mg | ORAL_TABLET | Freq: Every day | ORAL | 11 refills | Status: DC
Start: 2021-03-05 — End: 2021-11-12

## 2021-03-05 MED ORDER — ROSUVASTATIN CALCIUM 5 MG PO TABS: ORAL_TABLET | ORAL | 3 refills | Status: DC

## 2021-03-05 NOTE — Progress Notes (Signed)
[facility] [place], [place] [address]  Internal MEDICINE  Office Visit Note  Patient Name: Marissa Wilson  [id number]  [id number]  Date of Service: 03/11/2021  Chief Complaint  Patient presents with   Follow-up    Pt may have sinus infection, drainage,    Hypertension   Asthma    HPI Pt is here for routine follow up Was seen on previous visit for lower ext edema, her HCTZ was switched to lasix, swelling is better. Pt has severe varicose veins and has app with her specialist for varicose veins with thrombophlebitis  C/o sinus congestion and post nasal drip. Denies any c/p and sob    Current Medication: Outpatient Encounter Medications as of 03/05/2021  Medication Sig   acetaminophen (TYLENOL) 650 MG CR tablet Take 650 mg every 6 (six) hours by mouth.   azithromycin (ZITHROMAX) 250 MG tablet Take one tab a day for 10 days for uri   cetirizine (ZYRTEC) 10 MG tablet Take 1 tablet (10 mg total) by mouth daily. For allergies   Cholecalciferol 1000 units capsule Take 1,000 Units daily by mouth.   furosemide (LASIX) 20 MG tablet Take 1 tablet (20 mg total) by mouth daily.   levothyroxine (SYNTHROID) 75 MCG tablet TAKE 1 TABLET BY MOUTH EVERY DAY   montelukast (SINGULAIR) 10 MG tablet TAKE 1 TABLET BY MOUTH EVERYDAY AT BEDTIME   omeprazole (PRILOSEC) 40 MG capsule TAKE 1 CAPSULE (40 MG TOTAL) BY MOUTH DAILY.   rivaroxaban (XARELTO) 10 MG TABS tablet Take 10 mg by mouth at bedtime.   triamcinolone (NASACORT) 55 MCG/ACT AERO nasal inhaler Place 2 sprays into the nose daily.   triamterene-hydrochlorothiazide (DYAZIDE) 37.5-25 MG capsule TAKE 1 CAPSULE BY MOUTH IN THE MORNING FOR FLUID   vitamin k 100 MCG tablet Take 100 mcg by mouth daily.   [DISCONTINUED] rosuvastatin (CRESTOR) 5 MG tablet Take one tab po qweek   [DISCONTINUED] Tdap (BOOSTRIX) 5-2.5-18.5 LF-MCG/0.5 injection Inject 0.5 mLs into the muscle once.   No facility-administered encounter medications  on file as of 03/05/2021.    Surgical History: Past Surgical History:  Procedure Laterality Date   ABDOMINAL HYSTERECTOMY     COLONOSCOPY WITH PROPOFOL N/A 06/20/2020   Procedure: COLONOSCOPY WITH PROPOFOL;  Surgeon: Robert Bellow, MD;  Location: [facility] ENDOSCOPY;  Service: Endoscopy;  Laterality: N/A;   ESOPHAGOGASTRODUODENOSCOPY (EGD) WITH PROPOFOL N/A 06/20/2020   Procedure: ESOPHAGOGASTRODUODENOSCOPY (EGD) WITH PROPOFOL;  Surgeon: Robert Bellow, MD;  Location: [facility] ENDOSCOPY;  Service: Endoscopy;  Laterality: N/A;  COVID POSITIVE 05/19/20   PR ENDOVENOUS LASTER, 1ST VEIN Right 06/09/2017   Procedure: EVLA of Right GSV; Surgeon: Lauretta Grill, MD; Location: MAIN OR [facility]; Service: Vascular   PR LIGATN LONG SAPHENOUS VEIN AT SEPH-FEM JUNC Right 06/09/2017   Procedure: LIGATION & DIVISION OF LONG SAPHENOUS VEIN AT SAPHENOFEMORAL JUNCTION, OR DISTAL INTERRUPTIONS; Surgeon: Lauretta Grill, MD; Location: MAIN OR [place]; Service: Vascular   PR PHLEB VEINS EXTREM TO 20 Right 06/09/2017   Procedure: STAB PHLEBECTOMY OF VARICOSE VEINS, 1 EXTREMITY: 10-20 STAB INCISIONS; Surgeon: Lauretta Grill, MD; Location: [facility] OR [facility]; Service: Vascular    Medical History: Past Medical History:  Diagnosis Date   Asthma    Clotting disorder ([place])    Gastrointestinal disorder    GERD (gastroesophageal reflux disease)    Hx of melanoma excision    Hyperlipidemia, unspecified    Hypertension    Hypothyroid    Osteoarthritis    Sleep apnea  Thyroid disease    UTI (urinary tract infection)     Family History: Family History  Problem Relation Age of Onset   Breast cancer Mother 46   Varicose Veins Mother    Lung cancer Father    Heart attack Father    Bladder Cancer Neg Hx    Kidney cancer Neg Hx     Social History   Socioeconomic History   Marital status: Married    Spouse name: Nada Boozer   Number of children: 2   Years of education: 13   Highest education level:  Some college, no degree  Occupational History   Not on file  Tobacco Use   Smoking status: Never   Smokeless tobacco: Never  Vaping Use   Vaping Use: Never used  Substance and Sexual Activity   Alcohol use: Yes    Comment: occasional wine   Drug use: No   Sexual activity: Not on file  Other Topics Concern   Not on file  Social History Narrative   Not on file   Social Determinants of Health   Financial Resource Strain: Not on file  Food Insecurity: Not on file  Transportation Needs: Not on file  Physical Activity: Not on file  Stress: Not on file  Social Connections: Not on file  Intimate Partner Violence: Not on file      Review of Systems  Constitutional:  Negative for fatigue and fever.  HENT:  Positive for postnasal drip and rhinorrhea. Negative for congestion and mouth sores.   Respiratory:  Positive for cough.   Cardiovascular:  Positive for leg swelling. Negative for chest pain.  Gastrointestinal: Negative.   Genitourinary:  Negative for flank pain.  Musculoskeletal: Negative.   Psychiatric/Behavioral: Negative.     Vital Signs: BP (!) 144/73   Pulse 75   Temp 98.7 F (37.1 C)   Resp 16   Ht 5\' 3"  (1.6 m)   Wt 155 lb 6.4 oz (70.5 kg)   SpO2 94%   BMI 27.53 kg/m    Physical Exam Constitutional:      Appearance: Normal appearance.  HENT:     Head: Normocephalic and atraumatic.     Right Ear: Tympanic membrane normal.     Left Ear: Tympanic membrane normal.     Nose: Congestion present.     Mouth/Throat:     Mouth: Mucous membranes are moist.     Pharynx: No posterior oropharyngeal erythema.  Eyes:     Extraocular Movements: Extraocular movements intact.     Pupils: Pupils are equal, round, and reactive to light.  Cardiovascular:     Pulses: Normal pulses.     Heart sounds: Normal heart sounds.  Pulmonary:     Effort: Pulmonary effort is normal.     Breath sounds: Normal breath sounds.  Musculoskeletal:     Right lower leg: Edema present.      Left lower leg: Edema present.  Neurological:     General: No focal deficit present.     Mental Status: She is alert.  Psychiatric:        Mood and Affect: Mood normal.        Behavior: Behavior normal.       Assessment/Plan: 1. Acute recurrent maxillary sinusitis Will continue Singulair, might need allergy testing if no improvement  - azithromycin (ZITHROMAX) 250 MG tablet; Take one tab a day for 10 days for uri  Dispense: 10 tablet; Refill: 0 - triamcinolone (NASACORT) 55 MCG/ACT AERO nasal inhaler; Place 2  sprays into the nose daily.  Dispense: 1 each; Refill: 12  2. Non-seasonal allergic rhinitis, unspecified trigger Add nasacort  - triamcinolone (NASACORT) 55 MCG/ACT AERO nasal inhaler; Place 2 sprays into the nose daily.  Dispense: 1 each; Refill: 12 - cetirizine (ZYRTEC) 10 MG tablet; Take 1 tablet (10 mg total) by mouth daily. For allergies  Dispense: 30 tablet; Refill: 11  3. Localized edema Continue Lasix 20 mg po qd, will check potassium  - Potassium  4. Visit for screening mammogram - MM DIGITAL SCREENING BILATERAL; Future   General Counseling: annalisia ingber understanding of the findings of todays visit and agrees with plan of treatment. I have discussed any further diagnostic evaluation that may be needed or ordered today. We also reviewed her medications today. she has been encouraged to call the office with any questions or concerns that should arise related to todays visit.  Orders Placed This Encounter  Procedures   MM DIGITAL SCREENING BILATERAL   Potassium    Meds ordered this encounter  Medications   azithromycin (ZITHROMAX) 250 MG tablet    Sig: Take one tab a day for 10 days for uri    Dispense:  10 tablet    Refill:  0   triamcinolone (NASACORT) 55 MCG/ACT AERO nasal inhaler    Sig: Place 2 sprays into the nose daily.    Dispense:  1 each    Refill:  12   cetirizine (ZYRTEC) 10 MG tablet    Sig: Take 1 tablet (10 mg total) by mouth  daily. For allergies    Dispense:  30 tablet    Refill:  11   DISCONTD: rosuvastatin (CRESTOR) 5 MG tablet    Sig: Take one tab po qweek    Dispense:  90 tablet    Refill:  3    Total time spent:35 Minutes Time spent includes review of chart, medications, test results, and follow up plan with the patient.   [place] Controlled Substance Database was reviewed by me.   Dr Lavera Guise Internal medicine

## 2021-03-05 NOTE — Telephone Encounter (Signed)
Once a week

## 2021-03-11 ENCOUNTER — Other Ambulatory Visit: Payer: Self-pay | Admitting: Internal Medicine

## 2021-03-11 DIAGNOSIS — Z1231 Encounter for screening mammogram for malignant neoplasm of breast: Secondary | ICD-10-CM

## 2021-03-17 ENCOUNTER — Other Ambulatory Visit: Payer: Self-pay | Admitting: Internal Medicine

## 2021-04-02 ENCOUNTER — Ambulatory Visit (INDEPENDENT_AMBULATORY_CARE_PROVIDER_SITE_OTHER): Payer: PPO | Admitting: Internal Medicine

## 2021-04-02 ENCOUNTER — Other Ambulatory Visit: Payer: Self-pay

## 2021-04-02 ENCOUNTER — Other Ambulatory Visit: Payer: Self-pay | Admitting: Internal Medicine

## 2021-04-02 ENCOUNTER — Encounter: Payer: Self-pay | Admitting: Internal Medicine

## 2021-04-02 VITALS — BP 140/72 | HR 68 | Temp 97.9°F | Resp 16 | Ht 63.0 in | Wt 159.0 lb

## 2021-04-02 DIAGNOSIS — H9202 Otalgia, left ear: Secondary | ICD-10-CM

## 2021-04-02 DIAGNOSIS — H6592 Unspecified nonsuppurative otitis media, left ear: Secondary | ICD-10-CM | POA: Diagnosis not present

## 2021-04-02 DIAGNOSIS — J3089 Other allergic rhinitis: Secondary | ICD-10-CM

## 2021-04-02 MED ORDER — AZITHROMYCIN 250 MG PO TABS: ORAL_TABLET | ORAL | 0 refills | Status: DC

## 2021-04-02 MED ORDER — AZELASTINE-FLUTICASONE 137-50 MCG/ACT NA SUSP: 1.0000 | Freq: Two times a day (BID) | NASAL | 1 refills | Status: DC

## 2021-04-02 MED ORDER — AZELASTINE HCL 0.1 % NA SOLN: 2.0000 | Freq: Two times a day (BID) | NASAL | 12 refills | Status: DC

## 2021-04-02 NOTE — Progress Notes (Signed)
[facility] [place], [place] [address]  Internal MEDICINE  Office Visit Note  Patient Name: Marissa Wilson  [id number]  [id number]  Date of Service: 04/02/2021  Chief Complaint  Patient presents with   Otitis Media    Left ear and hurts at bottom of ear/side of left jaw.  Started last night     HPI Pt is here for a sick visit. C/O left sided ear ache, goes down in throat and in front of left ear  Has excessive post nasal drip, takes Singulair and Zyretc everyday  No exposure of COVID  Denies any fever or chills    Current Medication:  Outpatient Encounter Medications as of 04/02/2021  Medication Sig   acetaminophen (TYLENOL) 650 MG CR tablet Take 650 mg every 6 (six) hours by mouth.   azelastine (ASTELIN) 0.1 % nasal spray Place 2 sprays into both nostrils 2 (two) times daily. Use in each nostril as directed   azithromycin (ZITHROMAX) 250 MG tablet Take one tab a day for 10 days   calcium carbonate (OS-CAL) 1250 (500 Ca) MG chewable tablet Chew by mouth.   cetirizine (ZYRTEC) 10 MG tablet Take 1 tablet (10 mg total) by mouth daily. For allergies   Cholecalciferol 1000 units capsule Take 1,000 Units daily by mouth.   furosemide (LASIX) 20 MG tablet Take 1 tablet (20 mg total) by mouth daily.   levothyroxine (SYNTHROID) 75 MCG tablet Take 1 tablet by mouth daily.   levothyroxine (SYNTHROID) 75 MCG tablet TAKE 1 TABLET BY MOUTH EVERY DAY   metoCLOPramide (REGLAN) 5 MG tablet Take one tablet 30 minutes prior to prep and repeat in 4 hours if needed.   montelukast (SINGULAIR) 10 MG tablet TAKE 1 TABLET BY MOUTH EVERYDAY AT BEDTIME   omeprazole (PRILOSEC) 40 MG capsule TAKE 1 CAPSULE (40 MG TOTAL) BY MOUTH DAILY.   omeprazole (PRILOSEC) 40 MG capsule Take 1 capsule by mouth daily.   polyethylene glycol powder (GLYCOLAX/MIRALAX) 17 GM/SCOOP powder One bottle for colonoscopy prep. Use as directed.   rivaroxaban (XARELTO) 10 MG TABS tablet Take 10 mg by  mouth at bedtime.   rosuvastatin (CRESTOR) 5 MG tablet TAKE ONE TAB BY MOUTH ONCE A WEEK   triamcinolone (NASACORT) 55 MCG/ACT AERO nasal inhaler Place 2 sprays into the nose daily.   triamterene-hydrochlorothiazide (DYAZIDE) 37.5-25 MG capsule TAKE 1 CAPSULE BY MOUTH IN THE MORNING FOR FLUID   vitamin k 100 MCG tablet Take 100 mcg by mouth daily.   [DISCONTINUED] Azelastine-Fluticasone 137-50 MCG/ACT SUSP Place 1 spray into the nose every 12 (twelve) hours. (Patient not taking: Reported on 04/02/2021)   [DISCONTINUED] azithromycin (ZITHROMAX) 250 MG tablet Take one tab a day for 10 days for uri (Patient not taking: Reported on 04/02/2021)   No facility-administered encounter medications on file as of 04/02/2021.      Medical History: Past Medical History:  Diagnosis Date   Asthma    Clotting disorder ([place])    Gastrointestinal disorder    GERD (gastroesophageal reflux disease)    Hx of melanoma excision    Hyperlipidemia, unspecified    Hypertension    Hypothyroid    Osteoarthritis    Sleep apnea    Thyroid disease    UTI (urinary tract infection)      Vital Signs: BP 140/72   Pulse 68   Temp 97.9 F (36.6 C)   Resp 16   Ht '5\' 3"'$  (1.6 m)   Wt 159 lb (72.1 kg)  SpO2 96%   BMI 28.17 kg/m    Review of Systems  Constitutional:  Negative for fatigue and fever.  HENT:  Positive for postnasal drip and rhinorrhea. Negative for congestion and mouth sores.        Ringing in left ear   Respiratory:  Negative for cough.   Cardiovascular:  Negative for chest pain.  Genitourinary:  Negative for flank pain.  Psychiatric/Behavioral: Negative.     Physical Exam Constitutional:      Appearance: Normal appearance.  HENT:     Head: Normocephalic and atraumatic.     Nose: Nose normal.     Mouth/Throat:     Mouth: Mucous membranes are moist.     Pharynx: No posterior oropharyngeal erythema.  Eyes:     Extraocular Movements: Extraocular movements intact.     Pupils: Pupils are  equal, round, and reactive to light.  Cardiovascular:     Pulses: Normal pulses.     Heart sounds: Normal heart sounds.  Pulmonary:     Effort: Pulmonary effort is normal.     Breath sounds: Normal breath sounds.  Neurological:     General: No focal deficit present.     Mental Status: She is alert.      Assessment/Plan: 1. Otalgia, left Tenderness over the anterior part of ear, ?? TMJ or abscess, will monitor, if pain persists might need to see ENT   2. Non-seasonal allergic rhinitis, unspecified trigger Add Azelastine and switch INCS to Nasacort aq. As prescribed   3. Left non-suppurative otitis media Mild erythema on left tympanic membrane, add azithromycin  - azithromycin (ZITHROMAX) 250 MG tablet; Take one tab a day for 10 days  Dispense: 10 tablet; Refill: 0   General Counseling: Marissa Wilson verbalizes understanding of the findings of todays visit and agrees with plan of treatment. I have discussed any further diagnostic evaluation that may be needed or ordered today. We also reviewed her medications today. she has been encouraged to call the office with any questions or concerns that should arise related to todays visit.    Meds ordered this encounter  Medications   DISCONTD: Azelastine-Fluticasone 137-50 MCG/ACT SUSP    Sig: Place 1 spray into the nose every 12 (twelve) hours.    Dispense:  23 g    Refill:  1   azithromycin (ZITHROMAX) 250 MG tablet    Sig: Take one tab a day for 10 days    Dispense:  10 tablet    Refill:  0    [place] Controlled Substance Database was reviewed by me.  Time spent:20 Minutes

## 2021-04-06 DIAGNOSIS — I872 Venous insufficiency (chronic) (peripheral): Secondary | ICD-10-CM | POA: Diagnosis not present

## 2021-04-06 DIAGNOSIS — R600 Localized edema: Secondary | ICD-10-CM | POA: Diagnosis not present

## 2021-04-06 DIAGNOSIS — Z7901 Long term (current) use of anticoagulants: Secondary | ICD-10-CM | POA: Diagnosis not present

## 2021-04-15 ENCOUNTER — Other Ambulatory Visit: Payer: Self-pay | Admitting: Internal Medicine

## 2021-04-15 DIAGNOSIS — J3089 Other allergic rhinitis: Secondary | ICD-10-CM

## 2021-04-21 ENCOUNTER — Other Ambulatory Visit: Payer: Self-pay | Admitting: Internal Medicine

## 2021-04-28 ENCOUNTER — Other Ambulatory Visit: Payer: Self-pay

## 2021-04-28 ENCOUNTER — Ambulatory Visit
Admission: RE | Admit: 2021-04-28 | Discharge: 2021-04-28 | Disposition: A | Discharge status: Home / Self Care | Payer: PPO | Source: Ambulatory Visit | Attending: Internal Medicine | Admitting: Internal Medicine

## 2021-04-28 DIAGNOSIS — Z1231 Encounter for screening mammogram for malignant neoplasm of breast: Secondary | ICD-10-CM | POA: Diagnosis not present

## 2021-05-05 ENCOUNTER — Ambulatory Visit: Payer: PPO | Admitting: Internal Medicine

## 2021-05-07 ENCOUNTER — Other Ambulatory Visit: Payer: Self-pay | Admitting: Internal Medicine

## 2021-05-07 DIAGNOSIS — I83893 Varicose veins of bilateral lower extremities with other complications: Secondary | ICD-10-CM

## 2021-05-18 ENCOUNTER — Encounter: Payer: Self-pay | Admitting: Internal Medicine

## 2021-05-18 ENCOUNTER — Other Ambulatory Visit: Payer: Self-pay

## 2021-05-18 ENCOUNTER — Ambulatory Visit (INDEPENDENT_AMBULATORY_CARE_PROVIDER_SITE_OTHER): Payer: PPO | Admitting: Internal Medicine

## 2021-05-18 VITALS — BP 142/70 | HR 66 | Temp 98.2°F | Resp 16 | Ht 62.5 in | Wt 162.0 lb

## 2021-05-18 DIAGNOSIS — E782 Mixed hyperlipidemia: Secondary | ICD-10-CM

## 2021-05-18 DIAGNOSIS — R030 Elevated blood-pressure reading, without diagnosis of hypertension: Secondary | ICD-10-CM

## 2021-05-18 DIAGNOSIS — I82812 Embolism and thrombosis of superficial veins of left lower extremities: Secondary | ICD-10-CM | POA: Diagnosis not present

## 2021-05-18 DIAGNOSIS — E041 Nontoxic single thyroid nodule: Secondary | ICD-10-CM | POA: Diagnosis not present

## 2021-05-18 DIAGNOSIS — Z20822 Contact with and (suspected) exposure to covid-19: Secondary | ICD-10-CM | POA: Diagnosis not present

## 2021-05-18 MED ORDER — ROSUVASTATIN CALCIUM 5 MG PO TABS: ORAL_TABLET | ORAL | 3 refills | Status: DC

## 2021-05-18 NOTE — Progress Notes (Signed)
[facility] [place], [place] [address]  Internal MEDICINE  Office Visit Note  Patient Name: Marissa Wilson  [id number]  [id number]  Date of Service: 05/25/2021  Chief Complaint  Patient presents with   Follow-up    Medication     HPI  Patient is here for routine follow-up. Patient is varicosities of bilateral lower extremities, severe swelling around her ankle her hydrochlorothiazide was switched to Lasix 20 mg once a day and then as needed.  Patient is already scheduled to have vascular surgery at [facility].  Her electrolytes have been within range. Patient has intolerance to statin at least 2 extreme elevation in her transaminases, Crestor was restarted only once a week because she has been tolerating fine with out any right upper quadrant pain or leg pain. Patient has noticed a lump on the right side of her neck she does have a history of multinodular goiter and is on replacement therapy for hypothyroidism with Synthroid. She also has severe allergies and and she is maintained on optimal therapy with the azelastine Zyrtec and intranasal corticosteroids  Current Medication: Outpatient Encounter Medications as of 05/18/2021  Medication Sig   acetaminophen (TYLENOL) 650 MG CR tablet Take 650 mg every 6 (six) hours by mouth.   azelastine (ASTELIN) 0.1 % nasal spray Place 2 sprays into both nostrils 2 (two) times daily. Use in each nostril as directed   cetirizine (ZYRTEC) 10 MG tablet Take 1 tablet (10 mg total) by mouth daily. For allergies   Cholecalciferol 1000 units capsule Take 1,000 Units daily by mouth.   furosemide (LASIX) 20 MG tablet TAKE 1 TABLET BY MOUTH EVERY DAY   levothyroxine (SYNTHROID) 75 MCG tablet Take 1 tablet by mouth daily.   montelukast (SINGULAIR) 10 MG tablet TAKE 1 TABLET BY MOUTH EVERYDAY AT BEDTIME   omeprazole (PRILOSEC) 40 MG capsule Take 1 capsule by mouth daily.   rivaroxaban (XARELTO) 10 MG TABS tablet Take 10  mg by mouth at bedtime.   vitamin k 100 MCG tablet Take 100 mcg by mouth daily.   [DISCONTINUED] rosuvastatin (CRESTOR) 5 MG tablet TAKE ONE TAB BY MOUTH ONCE A WEEK   rosuvastatin (CRESTOR) 5 MG tablet TAKE ONE TAB BY MOUTH twice A WEEK   [DISCONTINUED] azithromycin (ZITHROMAX) 250 MG tablet Take one tab a day for 10 days (Patient not taking: Reported on 05/18/2021)   [DISCONTINUED] calcium carbonate (OS-CAL) 1250 (500 Ca) MG chewable tablet Chew by mouth. (Patient not taking: Reported on 05/18/2021)   [DISCONTINUED] levothyroxine (SYNTHROID) 75 MCG tablet TAKE 1 TABLET BY MOUTH EVERY DAY   [DISCONTINUED] metoCLOPramide (REGLAN) 5 MG tablet Take one tablet 30 minutes prior to prep and repeat in 4 hours if needed. (Patient not taking: Reported on 05/18/2021)   [DISCONTINUED] omeprazole (PRILOSEC) 40 MG capsule TAKE 1 CAPSULE (40 MG TOTAL) BY MOUTH DAILY.   [DISCONTINUED] polyethylene glycol powder (GLYCOLAX/MIRALAX) 17 GM/SCOOP powder One bottle for colonoscopy prep. Use as directed. (Patient not taking: Reported on 05/18/2021)   [DISCONTINUED] triamcinolone (NASACORT) 55 MCG/ACT AERO nasal inhaler Place 2 sprays into the nose daily. (Patient not taking: Reported on 05/18/2021)   [DISCONTINUED] triamterene-hydrochlorothiazide (DYAZIDE) 37.5-25 MG capsule TAKE 1 CAPSULE BY MOUTH IN THE MORNING FOR FLUID (Patient not taking: Reported on 05/18/2021)   No facility-administered encounter medications on file as of 05/18/2021.    Surgical History: Past Surgical History:  Procedure Laterality Date   ABDOMINAL HYSTERECTOMY     COLONOSCOPY WITH PROPOFOL N/A 06/20/2020  Procedure: COLONOSCOPY WITH PROPOFOL;  Surgeon: Robert Bellow, MD;  [facility] ENDOSCOPY;  Service: Endoscopy;  Laterality: N/A;   ESOPHAGOGASTRODUODENOSCOPY (EGD) WITH PROPOFOL N/A 06/20/2020   Procedure: ESOPHAGOGASTRODUODENOSCOPY (EGD) WITH PROPOFOL;  Surgeon: Robert Bellow, MD;  Location: [facility] ENDOSCOPY;  Service: Endoscopy;   Laterality: N/A;  COVID POSITIVE 05/19/20   PR ENDOVENOUS LASTER, 1ST VEIN Right 06/09/2017   Procedure: EVLA of Right GSV; Surgeon: Lauretta Grill, MD; Location: MAIN OR [facility]; Service: Vascular   PR LIGATN LONG SAPHENOUS VEIN AT SEPH-FEM JUNC Right 06/09/2017   Procedure: LIGATION & DIVISION OF LONG SAPHENOUS VEIN AT SAPHENOFEMORAL JUNCTION, OR DISTAL INTERRUPTIONS; Surgeon: Lauretta Grill, MD; Location: MAIN OR [place]; Service: Vascular   PR PHLEB VEINS EXTREM TO 20 Right 06/09/2017   Procedure: STAB PHLEBECTOMY OF VARICOSE VEINS, 1 EXTREMITY: 10-20 STAB INCISIONS; Surgeon: Lauretta Grill, MD; Location: MAIN OR [facility]; Service: Vascular    Medical History: Past Medical History:  Diagnosis Date   Asthma    Clotting disorder ([place])    Gastrointestinal disorder    GERD (gastroesophageal reflux disease)    Hx of melanoma excision    Hyperlipidemia, unspecified    Hypertension    Hypothyroid    Osteoarthritis    Sleep apnea    Thyroid disease    UTI (urinary tract infection)     Family History: Family History  Problem Relation Age of Onset   Breast cancer Mother 54   Varicose Veins Mother    Lung cancer Father    Heart attack Father    Bladder Cancer Neg Hx    Kidney cancer Neg Hx     Social History   Socioeconomic History   Marital status: Married    Spouse name: Nada Boozer   Number of children: 2   Years of education: 13   Highest education level: Some college, no degree  Occupational History   Not on file  Tobacco Use   Smoking status: Never   Smokeless tobacco: Never  Vaping Use   Vaping Use: Never used  Substance and Sexual Activity   Alcohol use: Yes    Comment: occasional wine   Drug use: No   Sexual activity: Not on file  Other Topics Concern   Not on file  Social History Narrative   Not on file   Social Determinants of Health   Financial Resource Strain: Not on file  Food Insecurity: Not on file  Transportation Needs: Not on file   Physical Activity: Not on file  Stress: Not on file  Social Connections: Not on file  Intimate Partner Violence: Not on file      Review of Systems  Constitutional:  Negative for chills, fatigue and unexpected weight change.  HENT:  Positive for postnasal drip. Negative for congestion, rhinorrhea, sneezing and sore throat.   Eyes:  Negative for redness.  Respiratory:  Negative for cough, chest tightness and shortness of breath.   Cardiovascular:  Negative for chest pain and palpitations.  Gastrointestinal:  Negative for abdominal pain, constipation, diarrhea, nausea and vomiting.  Genitourinary:  Negative for dysuria and frequency.  Musculoskeletal:  Negative for arthralgias, back pain, joint swelling and neck pain.  Skin:  Negative for rash.  Neurological: Negative.  Negative for tremors and numbness.  Hematological:  Negative for adenopathy. Does not bruise/bleed easily.  Psychiatric/Behavioral:  Negative for behavioral problems (Depression), sleep disturbance and suicidal ideas. The patient is not nervous/anxious.    Vital Signs: BP (!) 142/70   Pulse 66  Temp 98.2 F (36.8 C)   Resp 16   Ht 5' 2.5" (1.588 m)   Wt 162 lb (73.5 kg)   SpO2 98%   BMI 29.16 kg/m    Physical Exam Neck:      Comments: Right thyroid nodule       Assessment/Plan: 1. Mixed hyperlipidemia Patient is tolerating Crestor okay will increase to twice a week and get LFTs - rosuvastatin (CRESTOR) 5 MG tablet; TAKE ONE TAB BY MOUTH twice A WEEK  Dispense: 24 tablet; Refill: 3 - Hepatic function panel - Lipid Panel With LDL/HDL Ratio  2. Thyroid nodule Patient has change in her examination, nodules felt on the right side we will order a thyroid ultrasound  once a day to - US THYROID; Future - TSH + free T4  3. Elevated blood pressure reading she will monitor her blood pressure If needed we will add therapy we will continue Lasix in the meantime 20 mg once a day  4. 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 I have discussed any further diagnostic evaluation that may be needed or ordered today. We also reviewed her medications today. she has been encouraged to call the office with any questions or concerns that should arise related to todays visit.    Orders Placed This Encounter  Procedures   US THYROID   Hepatic function panel   Lipid Panel With LDL/HDL Ratio   TSH + free T4    Meds ordered this encounter  Medications   rosuvastatin (CRESTOR) 5 MG tablet    Sig: TAKE ONE TAB BY MOUTH twice A WEEK    Dispense:  24 tablet    Refill:  3    Total time spent:35 Minutes Time spent includes review of chart, medications, test results, and follow up plan with the patient.   [place] Controlled Substance Database was reviewed by me.   Dr Lavera Guise Internal medicine

## 2021-05-19 ENCOUNTER — Other Ambulatory Visit: Payer: Self-pay | Admitting: General Surgery

## 2021-05-19 DIAGNOSIS — K862 Cyst of pancreas: Secondary | ICD-10-CM

## 2021-05-21 DIAGNOSIS — E039 Hypothyroidism, unspecified: Secondary | ICD-10-CM | POA: Diagnosis not present

## 2021-05-21 DIAGNOSIS — I8392 Asymptomatic varicose veins of left lower extremity: Secondary | ICD-10-CM | POA: Diagnosis not present

## 2021-05-21 DIAGNOSIS — I83892 Varicose veins of left lower extremities with other complications: Secondary | ICD-10-CM | POA: Diagnosis not present

## 2021-05-21 DIAGNOSIS — K219 Gastro-esophageal reflux disease without esophagitis: Secondary | ICD-10-CM | POA: Diagnosis not present

## 2021-05-21 DIAGNOSIS — I872 Venous insufficiency (chronic) (peripheral): Secondary | ICD-10-CM | POA: Diagnosis not present

## 2021-05-21 DIAGNOSIS — Z79890 Hormone replacement therapy (postmenopausal): Secondary | ICD-10-CM | POA: Diagnosis not present

## 2021-05-21 DIAGNOSIS — I10 Essential (primary) hypertension: Secondary | ICD-10-CM | POA: Diagnosis not present

## 2021-05-21 DIAGNOSIS — Z79899 Other long term (current) drug therapy: Secondary | ICD-10-CM | POA: Diagnosis not present

## 2021-05-21 HISTORY — PX: ABLATION SAPHENOUS VEIN W/ RFA: SUR11

## 2021-05-29 DIAGNOSIS — Z09 Encounter for follow-up examination after completed treatment for conditions other than malignant neoplasm: Secondary | ICD-10-CM | POA: Diagnosis not present

## 2021-05-29 DIAGNOSIS — I872 Venous insufficiency (chronic) (peripheral): Secondary | ICD-10-CM | POA: Diagnosis not present

## 2021-06-01 ENCOUNTER — Ambulatory Visit: Payer: PPO

## 2021-06-08 ENCOUNTER — Ambulatory Visit: Payer: PPO

## 2021-06-08 ENCOUNTER — Encounter: Payer: Self-pay | Admitting: Internal Medicine

## 2021-06-09 NOTE — Telephone Encounter (Signed)
Please update

## 2021-06-11 ENCOUNTER — Ambulatory Visit: Admission: RE | Admit: 2021-06-11 | Payer: PPO | Source: Ambulatory Visit

## 2021-06-16 ENCOUNTER — Ambulatory Visit
Admission: RE | Admit: 2021-06-16 | Discharge: 2021-06-16 | Disposition: A | Discharge status: Home / Self Care | Payer: PPO | Source: Ambulatory Visit | Attending: Internal Medicine | Admitting: Internal Medicine

## 2021-06-16 ENCOUNTER — Other Ambulatory Visit: Payer: Self-pay

## 2021-06-16 ENCOUNTER — Ambulatory Visit
Admission: RE | Admit: 2021-06-16 | Discharge: 2021-06-16 | Disposition: A | Discharge status: Home / Self Care | Payer: PPO | Source: Ambulatory Visit | Attending: General Surgery | Admitting: General Surgery

## 2021-06-16 DIAGNOSIS — K449 Diaphragmatic hernia without obstruction or gangrene: Secondary | ICD-10-CM | POA: Diagnosis not present

## 2021-06-16 DIAGNOSIS — K862 Cyst of pancreas: Secondary | ICD-10-CM | POA: Insufficient documentation

## 2021-06-16 DIAGNOSIS — E041 Nontoxic single thyroid nodule: Secondary | ICD-10-CM | POA: Insufficient documentation

## 2021-06-16 DIAGNOSIS — E039 Hypothyroidism, unspecified: Secondary | ICD-10-CM | POA: Diagnosis not present

## 2021-06-16 DIAGNOSIS — E042 Nontoxic multinodular goiter: Secondary | ICD-10-CM | POA: Diagnosis not present

## 2021-06-16 MED ORDER — GADOBUTROL 1 MMOL/ML IV SOLN
7.0000 mL | Freq: Once | INTRAVENOUS | Status: AC | PRN
  Administered 2021-06-16: 7 mL via INTRAVENOUS

## 2021-06-18 DIAGNOSIS — K862 Cyst of pancreas: Secondary | ICD-10-CM | POA: Diagnosis not present

## 2021-06-22 DIAGNOSIS — Z48812 Encounter for surgical aftercare following surgery on the circulatory system: Secondary | ICD-10-CM | POA: Diagnosis not present

## 2021-06-22 DIAGNOSIS — I872 Venous insufficiency (chronic) (peripheral): Secondary | ICD-10-CM | POA: Diagnosis not present

## 2021-06-22 DIAGNOSIS — Z9889 Other specified postprocedural states: Secondary | ICD-10-CM | POA: Diagnosis not present

## 2021-07-27 DIAGNOSIS — Z9889 Other specified postprocedural states: Secondary | ICD-10-CM | POA: Diagnosis not present

## 2021-07-27 DIAGNOSIS — I872 Venous insufficiency (chronic) (peripheral): Secondary | ICD-10-CM | POA: Diagnosis not present

## 2021-07-29 DIAGNOSIS — I872 Venous insufficiency (chronic) (peripheral): Secondary | ICD-10-CM | POA: Diagnosis not present

## 2021-07-29 DIAGNOSIS — I82409 Acute embolism and thrombosis of unspecified deep veins of unspecified lower extremity: Secondary | ICD-10-CM | POA: Diagnosis not present

## 2021-07-29 DIAGNOSIS — R609 Edema, unspecified: Secondary | ICD-10-CM | POA: Diagnosis not present

## 2021-08-18 ENCOUNTER — Other Ambulatory Visit: Payer: Self-pay | Admitting: Internal Medicine

## 2021-08-18 DIAGNOSIS — I83893 Varicose veins of bilateral lower extremities with other complications: Secondary | ICD-10-CM

## 2021-08-27 DIAGNOSIS — I8010 Phlebitis and thrombophlebitis of unspecified femoral vein: Secondary | ICD-10-CM | POA: Diagnosis not present

## 2021-08-31 ENCOUNTER — Telehealth: Payer: Self-pay

## 2021-08-31 NOTE — Telephone Encounter (Signed)
Left vm and sent mychart message to confirm 09/01/21 appointment-Toni

## 2021-09-01 ENCOUNTER — Other Ambulatory Visit: Payer: Self-pay

## 2021-09-01 ENCOUNTER — Ambulatory Visit (INDEPENDENT_AMBULATORY_CARE_PROVIDER_SITE_OTHER): Payer: PPO | Admitting: Nurse Practitioner

## 2021-09-01 ENCOUNTER — Encounter: Payer: Self-pay | Admitting: Nurse Practitioner

## 2021-09-01 VITALS — BP 130/76 | HR 64 | Temp 98.3°F | Resp 16 | Ht 62.5 in | Wt 157.0 lb

## 2021-09-01 DIAGNOSIS — E041 Nontoxic single thyroid nodule: Secondary | ICD-10-CM

## 2021-09-01 DIAGNOSIS — E559 Vitamin D deficiency, unspecified: Secondary | ICD-10-CM

## 2021-09-01 DIAGNOSIS — E782 Mixed hyperlipidemia: Secondary | ICD-10-CM | POA: Diagnosis not present

## 2021-09-01 DIAGNOSIS — J0101 Acute recurrent maxillary sinusitis: Secondary | ICD-10-CM | POA: Diagnosis not present

## 2021-09-01 DIAGNOSIS — K869 Disease of pancreas, unspecified: Secondary | ICD-10-CM | POA: Diagnosis not present

## 2021-09-01 DIAGNOSIS — Z0001 Encounter for general adult medical examination with abnormal findings: Secondary | ICD-10-CM | POA: Diagnosis not present

## 2021-09-01 DIAGNOSIS — R051 Acute cough: Secondary | ICD-10-CM

## 2021-09-01 DIAGNOSIS — R600 Localized edema: Secondary | ICD-10-CM

## 2021-09-01 DIAGNOSIS — R300 Dysuria: Secondary | ICD-10-CM

## 2021-09-01 MED ORDER — GUAIFENESIN ER 600 MG PO TB12: 600.0000 mg | ORAL_TABLET | Freq: Two times a day (BID) | ORAL | 1 refills | Status: DC

## 2021-09-01 MED ORDER — BENZONATATE 100 MG PO CAPS: 100.0000 mg | ORAL_CAPSULE | Freq: Two times a day (BID) | ORAL | 0 refills | Status: DC | PRN

## 2021-09-01 MED ORDER — DOXYCYCLINE HYCLATE 100 MG PO TABS: 100.0000 mg | ORAL_TABLET | Freq: Two times a day (BID) | ORAL | 0 refills | Status: AC

## 2021-09-01 NOTE — Progress Notes (Signed)
[facility] [place], [place] [address]  Internal MEDICINE  Office Visit Note  Patient Name: Marissa Wilson  [id number]  [id number]  Date of Service: 09/01/2021  Chief Complaint  Patient presents with   Medicare Wellness    Neg covid test   Gastroesophageal Reflux   Hyperlipidemia   Hypertension   Asthma   Results   Cough   Sore Throat    drainage    HPI Rashanda presents for an annual well visit and physical exam.She is a well appearing 76 yo female with hyperlipidemia.  She had her mammogram in September last year and it was normal.  Her appointment with her eye doctor is scheduled for March.  She started taking her rosuvastatin again 2 weeks ago and is taking it twice a week and she is tolerating it without any muscle aches or achy joints.  She had an ultrasound done of her thyroid and no further evaluation is recommended.  There are 2 thyroid nodules that meet criteria for follow-up imaging in 1 year and there are also 2 thyroid nodules that are subcentimeter and do not meet criteria for imaging or biopsy.  An MRI of the abdomen was also done and the well-circumscribed pancreatic cyst was evaluated and a 61-month follow-up was recommended but patient wants to know what Dr. Clayborn Bigness thinks should be done. her blood pressure is stable.   MRI abdomen IMPRESSION: 1. Slight increase in size in the unilocular well-circumscribed cystic lesion in the pancreatic head without high risk MRI features. 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 Recommend follow up pre and post contrast MRI/MRCP 6 months. This recommendation follows ACR consensus guidelines: Management of Incidental Pancreatic Cysts: A White Paper of the ACR Incidental Findings Committee. J Am Coll Radiol 8469;62:952-841. 2. Small hiatal hernia. 3. No acute findings in the abdomen.  Current Medication: Outpatient Encounter Medications as of 09/01/2021  Medication Sig    acetaminophen (TYLENOL) 650 MG CR tablet Take 650 mg every 6 (six) hours by mouth.   azelastine (ASTELIN) 0.1 % nasal spray Place 2 sprays into both nostrils 2 (two) times daily. Use in each nostril as directed   benzonatate (TESSALON) 100 MG capsule Take 1 capsule (100 mg total) by mouth 2 (two) times daily as needed for cough.   cetirizine (ZYRTEC) 10 MG tablet Take 1 tablet (10 mg total) by mouth daily. For allergies   Cholecalciferol 1000 units capsule Take 1,000 Units daily by mouth.   doxycycline (VIBRA-TABS) 100 MG tablet Take 1 tablet (100 mg total) by mouth 2 (two) times daily for 10 days. Take with food.   furosemide (LASIX) 20 MG tablet TAKE 1 TABLET BY MOUTH EVERY DAY   guaiFENesin (MUCINEX) 600 MG 12 hr tablet Take 1 tablet (600 mg total) by mouth 2 (two) times daily.   levothyroxine (SYNTHROID) 75 MCG tablet TAKE 1 TABLET BY MOUTH EVERY DAY   montelukast (SINGULAIR) 10 MG tablet TAKE 1 TABLET BY MOUTH EVERYDAY AT BEDTIME   omeprazole (PRILOSEC) 40 MG capsule Take 1 capsule by mouth daily.   rivaroxaban (XARELTO) 10 MG TABS tablet Take 10 mg by mouth at bedtime.   rosuvastatin (CRESTOR) 5 MG tablet TAKE ONE TAB BY MOUTH twice A WEEK   vitamin k 100 MCG tablet Take 100 mcg by mouth daily.   [DISCONTINUED] furosemide (LASIX) 20 MG tablet TAKE 1 TABLET BY MOUTH EVERY DAY   [DISCONTINUED] levothyroxine (SYNTHROID) 75 MCG tablet Take 1 tablet by  mouth daily.   No facility-administered encounter medications on file as of 09/01/2021.    Surgical History: Past Surgical History:  Procedure Laterality Date   ABDOMINAL HYSTERECTOMY     ABLATION SAPHENOUS VEIN W/ RFA Right 05/21/2021   COLONOSCOPY WITH PROPOFOL N/A 06/20/2020   Procedure: COLONOSCOPY WITH PROPOFOL;  Surgeon: Robert Bellow, MD;  Location: [facility] ENDOSCOPY;  Service: Endoscopy;  Laterality: N/A;   ESOPHAGOGASTRODUODENOSCOPY (EGD) WITH PROPOFOL N/A 06/20/2020   Procedure: ESOPHAGOGASTRODUODENOSCOPY (EGD) WITH PROPOFOL;   Surgeon: Robert Bellow, MD;  Location: [facility] ENDOSCOPY;  Service: Endoscopy;  Laterality: N/A;  COVID POSITIVE 05/19/20   PR ENDOVENOUS LASTER, 1ST VEIN Right 06/09/2017   Procedure: EVLA of Right GSV; Surgeon: Lauretta Grill, MD; Location: MAIN OR [facility]; Service: Vascular   PR LIGATN LONG SAPHENOUS VEIN AT SEPH-FEM JUNC Right 06/09/2017   Procedure: LIGATION & DIVISION OF LONG SAPHENOUS VEIN AT SAPHENOFEMORAL JUNCTION, OR DISTAL INTERRUPTIONS; Surgeon: Lauretta Grill, MD; Location: MAIN OR [facility]; Service: Vascular   PR PHLEB VEINS EXTREM TO 20 Right 06/09/2017   Procedure: STAB PHLEBECTOMY OF VARICOSE VEINS, 1 EXTREMITY: 10-20 STAB INCISIONS; Surgeon: Lauretta Grill, MD; Location: MAIN OR [facility]; Service: Vascular    Medical History: Past Medical History:  Diagnosis Date   Asthma    Clotting disorder ([place])    Gastrointestinal disorder    GERD (gastroesophageal reflux disease)    Hx of melanoma excision    Hyperlipidemia, unspecified    Hypertension    Hypothyroid    Osteoarthritis    Sleep apnea    Thyroid disease    UTI (urinary tract infection)     Family History: Family History  Problem Relation Age of Onset   Breast cancer Mother 66   Varicose Veins Mother    Lung cancer Father    Heart attack Father    Bladder Cancer Neg Hx    Kidney cancer Neg Hx     Social History   Socioeconomic History   Marital status: Married    Spouse name: Nada Boozer   Number of children: 2   Years of education: 13   Highest education level: Some college, no degree  Occupational History   Not on file  Tobacco Use   Smoking status: Never   Smokeless tobacco: Never  Vaping Use   Vaping Use: Never used  Substance and Sexual Activity   Alcohol use: Yes    Comment: occasional wine   Drug use: No   Sexual activity: Not on file  Other Topics Concern   Not on file  Social History Narrative   Not on file   Social Determinants of Health   Financial Resource Strain:  Not on file  Food Insecurity: Not on file  Transportation Needs: Not on file  Physical Activity: Not on file  Stress: Not on file  Social Connections: Not on file  Intimate Partner Violence: Not on file      Review of Systems  Constitutional:  Negative for activity change, appetite change, chills, fatigue, fever and unexpected weight change.  HENT:  Positive for congestion, postnasal drip, rhinorrhea, sinus pressure, sinus pain and sore throat. Negative for ear pain and trouble swallowing.   Eyes: Negative.   Respiratory:  Positive for cough. Negative for chest tightness, shortness of breath and wheezing.   Cardiovascular: Negative.  Negative for chest pain.  Gastrointestinal: Negative.  Negative for abdominal pain, blood in stool, constipation, diarrhea, nausea and vomiting.  Endocrine: Negative.   Genitourinary: Negative.  Negative for  difficulty urinating, dysuria, frequency, hematuria and urgency.  Musculoskeletal: Negative.  Negative for arthralgias, back pain, joint swelling, myalgias and neck pain.  Skin: Negative.  Negative for rash and wound.  Allergic/Immunologic: Negative.  Negative for immunocompromised state.  Neurological: Negative.  Negative for dizziness, seizures, numbness and headaches.  Hematological: Negative.   Psychiatric/Behavioral: Negative.  Negative for behavioral problems, self-injury and suicidal ideas. The patient is not nervous/anxious.    Vital Signs: BP 130/76    Pulse 64    Temp 98.3 F (36.8 C)    Resp 16    Ht 5' 2.5" (1.588 m)    Wt 157 lb (71.2 kg)    SpO2 97%    BMI 28.26 kg/m    Physical Exam Vitals reviewed.  Constitutional:      General: She is awake. She is not in acute distress.    Appearance: Normal appearance. She is well-developed, well-groomed and normal weight. She is not ill-appearing or diaphoretic.  HENT:     Head: Normocephalic and atraumatic.     Right Ear: Tympanic membrane, ear canal and external ear normal.     Left Ear:  Tympanic membrane, ear canal and external ear normal.     Nose: Congestion and rhinorrhea present.     Mouth/Throat:     Lips: Pink.     Mouth: Mucous membranes are moist.     Pharynx: Oropharynx is clear. Uvula midline. Posterior oropharyngeal erythema present. No oropharyngeal exudate.  Eyes:     General: Lids are normal. Vision grossly intact. Gaze aligned appropriately. No scleral icterus.       Right eye: No discharge.        Left eye: No discharge.     Extraocular Movements: Extraocular movements intact.     Conjunctiva/sclera: Conjunctivae normal.     Pupils: Pupils are equal, round, and reactive to light.     Funduscopic exam:    Right eye: Red reflex present.        Left eye: Red reflex present. Neck:     Thyroid: Thyromegaly present.     Vascular: No JVD.     Trachea: Trachea and phonation normal. No tracheal deviation.  Cardiovascular:     Rate and Rhythm: Normal rate and regular rhythm.     Pulses: Normal pulses.     Heart sounds: Normal heart sounds, S1 normal and S2 normal. No murmur heard.   No friction rub. No gallop.  Pulmonary:     Effort: Pulmonary effort is normal. No accessory muscle usage or respiratory distress.     Breath sounds: Normal breath sounds and air entry. No stridor. No wheezing or rales.  Chest:     Chest wall: No tenderness.     Comments: Declined breast exam Abdominal:     General: Bowel sounds are normal. There is no distension.     Palpations: Abdomen is soft. There is no shifting dullness, fluid wave, mass or pulsatile mass.     Tenderness: There is no abdominal tenderness. There is no guarding or rebound.  Musculoskeletal:        General: No tenderness or deformity. Normal range of motion.     Cervical back: Normal range of motion and neck supple.     Right lower leg: No edema.     Left lower leg: No edema.  Lymphadenopathy:     Cervical: No cervical adenopathy.  Skin:    General: Skin is warm and dry.     Capillary Refill:  Capillary refill  takes less than 2 seconds.     Coloration: Skin is not pale.     Findings: No erythema or rash.  Neurological:     Mental Status: She is alert and oriented to person, place, and time.     Cranial Nerves: No cranial nerve deficit.     Motor: No abnormal muscle tone.     Coordination: Coordination normal.     Gait: Gait normal.     Deep Tendon Reflexes: Reflexes are normal and symmetric.  Psychiatric:        Mood and Affect: Mood normal.        Behavior: Behavior normal. Behavior is cooperative.        Thought Content: Thought content normal.        Judgment: Judgment normal.       Assessment/Plan: 1. Encounter for general adult medical examination with abnormal findings Age-appropriate preventive screenings and vaccinations discussed, annual physical exam completed. Routine labs for health maintenance ordered, see below. MRI of abdomen showed a pancreatic cyst, follow up discussed. PHM updated.   2. Acute recurrent maxillary sinusitis Empiric antibiotic treatment prescribed - doxycycline (VIBRA-TABS) 100 MG tablet; Take 1 tablet (100 mg total) by mouth 2 (two) times daily for 10 days. Take with food.  Dispense: 20 tablet; Refill: 0  3. Thyroid nodule Labs ordered, discussed thyroid ultrasound results, will repeat in 1 year.  - CBC with Differential/Platelet  4. Mixed hyperlipidemia Taking rosuvastatin twice a week  5. 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 Patient wants to know if she should go ahead and get a biopsy done. Will discuss with Dr. Humphrey Rolls  6. Vitamin D deficiency Routine labs ordered - CBC with Differential/Platelet - Vitamin D (25 hydroxy)  7. Acute cough Medication prescribed for symptomatic treatment of cough - benzonatate (TESSALON) 100 MG capsule; Take 1 capsule (100 mg total) by mouth 2 (two) times daily as needed  for cough.  Dispense: 30 capsule; Refill: 0 - guaiFENesin (MUCINEX) 600 MG 12 hr tablet; Take 1 tablet (600 mg total) by mouth 2 (two) times daily.  Dispense: 30 tablet; Refill: 1  8. Dysuria Routine urinalysis done - UA/M w/rflx Culture, Routine - Microscopic Examination      General Counseling: Noe verbalizes understanding of the findings of todays visit and agrees with plan of treatment. I have discussed any further diagnostic evaluation that may be needed or ordered today. We also reviewed her medications today. she has been encouraged to call the office with any questions or concerns that should arise related to todays visit.    Orders Placed This Encounter  Procedures   Microscopic Examination   CBC with Differential/Platelet   Vitamin D (25 hydroxy)   UA/M w/rflx Culture, Routine    Meds ordered this encounter  Medications   doxycycline (VIBRA-TABS) 100 MG tablet    Sig: Take 1 tablet (100 mg total) by mouth 2 (two) times daily for 10 days. Take with food.    Dispense:  20 tablet    Refill:  0   benzonatate (TESSALON) 100 MG capsule    Sig: Take 1 capsule (100 mg total) by mouth 2 (two) times daily as needed for cough.    Dispense:  30 capsule    Refill:  0   guaiFENesin (MUCINEX) 600 MG 12 hr tablet    Sig: Take 1 tablet (600 mg total) by mouth  2 (two) times daily.    Dispense:  30 tablet    Refill:  1    Patient wants to see if her insurance will cover it.    Return in about 6 months (around 03/01/2022) for F/U, med refill, Natiya Seelinger PCP, can also see Dr. Humphrey Rolls.   Total time spent:30 Minutes Time spent includes review of chart, medications, test results, and follow up plan with the patient.   [place] Controlled Substance Database was reviewed by me.  This patient was seen by Jonetta Osgood, FNP-C in collaboration with Dr. Clayborn Bigness as a part of collaborative care agreement.  Manami Tutor R. Valetta Fuller, MSN, FNP-C Internal medicine

## 2021-09-02 LAB — MICROSCOPIC EXAMINATION
Bacteria, UA: NONE SEEN
Casts: NONE SEEN /lpf
Epithelial Cells (non renal): NONE SEEN /hpf (ref 0–10)
RBC, Urine: NONE SEEN /hpf (ref 0–2)
WBC, UA: NONE SEEN /hpf (ref 0–5)

## 2021-09-02 LAB — UA/M W/RFLX CULTURE, ROUTINE
Bilirubin, UA: NEGATIVE
Glucose, UA: NEGATIVE
Ketones, UA: NEGATIVE
Leukocytes,UA: NEGATIVE
Nitrite, UA: NEGATIVE
Protein,UA: NEGATIVE
RBC, UA: NEGATIVE
Specific Gravity, UA: 1.011 (ref 1.005–1.030)
Urobilinogen, Ur: 0.2 mg/dL (ref 0.2–1.0)
pH, UA: 6.5 (ref 5.0–7.5)

## 2021-09-11 ENCOUNTER — Encounter: Payer: Self-pay | Admitting: Nurse Practitioner

## 2021-09-15 DIAGNOSIS — E782 Mixed hyperlipidemia: Secondary | ICD-10-CM | POA: Diagnosis not present

## 2021-09-15 DIAGNOSIS — J0101 Acute recurrent maxillary sinusitis: Secondary | ICD-10-CM | POA: Diagnosis not present

## 2021-09-15 DIAGNOSIS — E559 Vitamin D deficiency, unspecified: Secondary | ICD-10-CM | POA: Diagnosis not present

## 2021-09-15 DIAGNOSIS — E041 Nontoxic single thyroid nodule: Secondary | ICD-10-CM | POA: Diagnosis not present

## 2021-09-15 DIAGNOSIS — R600 Localized edema: Secondary | ICD-10-CM | POA: Diagnosis not present

## 2021-09-16 LAB — CBC WITH DIFFERENTIAL/PLATELET
Basophils Absolute: 0.1 10*3/uL (ref 0.0–0.2)
Basos: 1 %
EOS (ABSOLUTE): 0.1 10*3/uL (ref 0.0–0.4)
Eos: 2 %
Hematocrit: 39.8 % (ref 34.0–46.6)
Hemoglobin: 13.9 g/dL (ref 11.1–15.9)
Immature Grans (Abs): 0.1 10*3/uL (ref 0.0–0.1)
Immature Granulocytes: 1 %
Lymphocytes Absolute: 1.6 10*3/uL (ref 0.7–3.1)
Lymphs: 29 %
MCH: 31.2 pg (ref 26.6–33.0)
MCHC: 34.9 g/dL (ref 31.5–35.7)
MCV: 89 fL (ref 79–97)
Monocytes Absolute: 0.4 10*3/uL (ref 0.1–0.9)
Monocytes: 8 %
Neutrophils Absolute: 3.2 10*3/uL (ref 1.4–7.0)
Neutrophils: 59 %
Platelets: 212 10*3/uL (ref 150–450)
RBC: 4.46 x10E6/uL (ref 3.77–5.28)
RDW: 12.2 % (ref 11.7–15.4)
WBC: 5.4 10*3/uL (ref 3.4–10.8)

## 2021-09-16 LAB — VITAMIN D 25 HYDROXY (VIT D DEFICIENCY, FRACTURES): Vit D, 25-Hydroxy: 86.4 ng/mL (ref 30.0–100.0)

## 2021-09-17 NOTE — Progress Notes (Signed)
Please let the patient know that her CBC and vitamin D levels are normal.

## 2021-09-18 ENCOUNTER — Other Ambulatory Visit: Payer: Self-pay

## 2021-09-18 MED ORDER — AZITHROMYCIN 250 MG PO TABS: ORAL_TABLET | ORAL | 0 refills | Status: DC

## 2021-09-18 NOTE — Progress Notes (Signed)
Pt advised labs are normal and also pt having headache on left side and also sinusitis  and left ear pain as per Zithromax for 5 days advised to warm compression and if symptoms goes worse need to go Urgent care or call us back Monday

## 2021-09-18 NOTE — Progress Notes (Signed)
Lmom to call us back 

## 2021-09-21 DIAGNOSIS — Z9889 Other specified postprocedural states: Secondary | ICD-10-CM | POA: Diagnosis not present

## 2021-09-21 DIAGNOSIS — I872 Venous insufficiency (chronic) (peripheral): Secondary | ICD-10-CM | POA: Diagnosis not present

## 2021-11-11 ENCOUNTER — Other Ambulatory Visit: Payer: Self-pay | Admitting: Nurse Practitioner

## 2021-11-11 ENCOUNTER — Encounter: Payer: Self-pay | Admitting: Dermatology

## 2021-11-11 ENCOUNTER — Other Ambulatory Visit: Payer: Self-pay | Admitting: Internal Medicine

## 2021-11-11 DIAGNOSIS — J3089 Other allergic rhinitis: Secondary | ICD-10-CM

## 2021-11-11 DIAGNOSIS — I83893 Varicose veins of bilateral lower extremities with other complications: Secondary | ICD-10-CM

## 2021-11-11 DIAGNOSIS — R051 Acute cough: Secondary | ICD-10-CM

## 2021-11-12 ENCOUNTER — Other Ambulatory Visit: Payer: Self-pay

## 2021-11-12 NOTE — Telephone Encounter (Signed)
Spoke to pt, she said she did not need any refills of tessalon at this time  ?

## 2021-11-17 ENCOUNTER — Other Ambulatory Visit: Payer: Self-pay | Admitting: General Surgery

## 2021-11-17 DIAGNOSIS — K862 Cyst of pancreas: Secondary | ICD-10-CM

## 2021-12-16 ENCOUNTER — Ambulatory Visit
Admission: RE | Admit: 2021-12-16 | Discharge: 2021-12-16 | Disposition: A | Discharge status: Home / Self Care | Payer: PPO | Source: Ambulatory Visit | Attending: General Surgery | Admitting: General Surgery

## 2021-12-16 DIAGNOSIS — K862 Cyst of pancreas: Secondary | ICD-10-CM | POA: Insufficient documentation

## 2021-12-16 MED ORDER — GADOBUTROL 1 MMOL/ML IV SOLN
7.0000 mL | Freq: Once | INTRAVENOUS | Status: AC | PRN
  Administered 2021-12-16: 7 mL via INTRAVENOUS

## 2022-01-04 ENCOUNTER — Ambulatory Visit: Payer: PPO | Admitting: Dermatology

## 2022-01-04 DIAGNOSIS — B079 Viral wart, unspecified: Secondary | ICD-10-CM | POA: Diagnosis not present

## 2022-01-04 DIAGNOSIS — L578 Other skin changes due to chronic exposure to nonionizing radiation: Secondary | ICD-10-CM | POA: Diagnosis not present

## 2022-01-04 DIAGNOSIS — L814 Other melanin hyperpigmentation: Secondary | ICD-10-CM

## 2022-01-04 DIAGNOSIS — L821 Other seborrheic keratosis: Secondary | ICD-10-CM

## 2022-01-04 DIAGNOSIS — Z86006 Personal history of melanoma in-situ: Secondary | ICD-10-CM

## 2022-01-04 DIAGNOSIS — D485 Neoplasm of uncertain behavior of skin: Secondary | ICD-10-CM

## 2022-01-04 NOTE — Progress Notes (Signed)
New Patient Visit  Subjective  Marissa Wilson is a 76 y.o. female who presents for the following: Other (Spot on right hand x a few months). The patient has spots, moles and lesions to be evaluated, some may be new or changing and the patient has concerns that these could be cancer. 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  The following portions of the chart were reviewed this encounter and updated as appropriate:   Tobacco  Allergies  Meds  Problems  Med Hx  Surg Hx  Fam Hx     Review of Systems:  No other skin or systemic complaints except as noted in HPI or Assessment and Plan.  Objective  Well appearing patient in no apparent distress; mood and affect are within normal limits.  A focused examination was performed including right hand. Relevant physical exam findings are noted in the Assessment and Plan.  Right Hand - Posterior 0.8 cm hyperkeratotic papule   Assessment & Plan   History of Melanoma in Situ -face, status post Mohs surgery - No evidence of recurrence today -No lymphadenopathy. - Recommend regular full body skin exams - Recommend daily broad spectrum sunscreen SPF 30+ to sun-exposed areas, reapply every 2 hours as needed.  - Call if any new or changing lesions are noted between office visits - Recommend TBSE on follow up -patient has not had a total-body skin exam, nor has she had follow-up for her melanoma for many years.  She says she was lost to follow-up during COVID/pandemic due to not wanting to come in the office.  Actinic Damage - chronic, secondary to cumulative UV radiation exposure/sun exposure over time - diffuse scaly erythematous macules with underlying dyspigmentation - Recommend daily broad spectrum sunscreen SPF 30+ to sun-exposed areas, reapply every 2 hours as needed.  - Recommend staying in the shade or wearing long sleeves, sun glasses (UVA+UVB protection) and wide brim hats (4-inch brim  around the entire circumference of the hat). - Call for new or changing lesions.  Seborrheic Keratoses - Stuck-on, waxy, tan-brown papules and/or plaques  - Benign-appearing - Discussed benign etiology and prognosis. - Observe - Call for any changes  Lentigines - Scattered tan macules - Due to sun exposure - Benign-appering, observe - Recommend daily broad spectrum sunscreen SPF 30+ to sun-exposed areas, reapply every 2 hours as needed. - Call for any changes  Neoplasm of uncertain behavior of skin Right Hand - Posterior  Epidermal / dermal shaving  Lesion diameter (cm):  0.8 Informed consent: discussed and consent obtained   Timeout: patient name, date of birth, surgical site, and procedure verified   Procedure prep:  Patient was prepped and draped in usual sterile fashion Prep type:  Isopropyl alcohol Anesthesia: the lesion was anesthetized in a standard fashion   Anesthetic:  1% lidocaine w/ epinephrine 1-100,000 buffered w/ 8.4% NaHCO3 Instrument used: flexible razor blade   Hemostasis achieved with: pressure, aluminum chloride and electrodesiccation   Outcome: patient tolerated procedure well   Post-procedure details: sterile dressing applied and wound care instructions given   Dressing type: bandage and petrolatum    Destruction of lesion Complexity: extensive   Destruction method: electrodesiccation and curettage   Informed consent: discussed and consent obtained   Timeout:  patient name, date of birth, surgical site, and procedure verified Procedure prep:  Patient was prepped and draped in usual sterile fashion Prep type:  Isopropyl alcohol Anesthesia: the lesion was anesthetized in  a standard fashion   Anesthetic:  1% lidocaine w/ epinephrine 1-100,000 buffered w/ 8.4% NaHCO3 Curettage performed in three different directions: Yes   Electrodesiccation performed over the curetted area: Yes   Lesion length (cm):  0.8 Lesion width (cm):  0.8 Final wound size (cm):   1.1 Hemostasis achieved with:  pressure and aluminum chloride Outcome: patient tolerated procedure well with no complications   Post-procedure details: sterile dressing applied and wound care instructions given   Dressing type: bandage and petrolatum    Specimen 1 - Surgical pathology Differential Diagnosis: inflamed Sk vs SCC vs other Check Margins: No EDC today   Return in about 3 months (around 04/06/2022) for TBSE, Biopsy Follow up.  I, Ashok Cordia, CMA, am acting as scribe for Sarina Ser, MD . Documentation: I have reviewed the above documentation for accuracy and completeness, and I agree with the above.  Sarina Ser, MD

## 2022-01-04 NOTE — Progress Notes (Deleted)
   New Patient Visit  Subjective  Marissa Wilson is a 76 y.o. female who presents for the following: No chief complaint on file..  ***  The following portions of the chart were reviewed this encounter and updated as appropriate:       Review of Systems:  No other skin or systemic complaints except as noted in HPI or Assessment and Plan.  Objective  Well appearing patient in no apparent distress; mood and affect are within normal limits.  {[id number]::"Q full examination was performed including scalp, head, eyes, ears, nose, lips, neck, chest, axillae, abdomen, back, buttocks, bilateral upper extremities, bilateral lower extremities, hands, feet, fingers, toes, fingernails, and toenails. All findings within normal limits unless otherwise noted below."}    Assessment & Plan   No follow-ups on file.

## 2022-01-04 NOTE — Patient Instructions (Addendum)

## 2022-01-06 ENCOUNTER — Telehealth: Payer: Self-pay

## 2022-01-06 NOTE — Telephone Encounter (Signed)
-----   Message from Ralene Bathe, MD sent at 01/06/2022  5:01 PM EDT ----- Diagnosis Skin , right hand posterior VERRUCA VULGARIS, IRRITATED, BASE INVOLVED  Benign viral wart Already treated May recur No further treatment at this time

## 2022-01-06 NOTE — Telephone Encounter (Signed)
Patient informed of pathology results 

## 2022-01-10 ENCOUNTER — Encounter: Payer: Self-pay | Admitting: Dermatology

## 2022-02-03 ENCOUNTER — Telehealth: Payer: Self-pay

## 2022-02-03 NOTE — Telephone Encounter (Signed)
Faxed medical record payment request to [employer] at [phone] and requesting records mailed to  [facility] [place] [address].

## 2022-02-06 ENCOUNTER — Other Ambulatory Visit: Payer: Self-pay | Admitting: Internal Medicine

## 2022-02-06 DIAGNOSIS — J3089 Other allergic rhinitis: Secondary | ICD-10-CM

## 2022-03-02 ENCOUNTER — Ambulatory Visit: Payer: PPO | Admitting: Internal Medicine

## 2022-04-05 ENCOUNTER — Ambulatory Visit (INDEPENDENT_AMBULATORY_CARE_PROVIDER_SITE_OTHER): Payer: PPO | Admitting: Internal Medicine

## 2022-04-05 ENCOUNTER — Encounter: Payer: Self-pay | Admitting: Internal Medicine

## 2022-04-05 VITALS — BP 126/74 | HR 92 | Temp 98.3°F | Resp 16 | Ht 63.0 in | Wt 160.0 lb

## 2022-04-05 DIAGNOSIS — E782 Mixed hyperlipidemia: Secondary | ICD-10-CM

## 2022-04-05 DIAGNOSIS — L989 Disorder of the skin and subcutaneous tissue, unspecified: Secondary | ICD-10-CM

## 2022-04-05 DIAGNOSIS — Z1231 Encounter for screening mammogram for malignant neoplasm of breast: Secondary | ICD-10-CM

## 2022-04-05 DIAGNOSIS — I10 Essential (primary) hypertension: Secondary | ICD-10-CM | POA: Diagnosis not present

## 2022-04-05 DIAGNOSIS — R300 Dysuria: Secondary | ICD-10-CM

## 2022-04-05 DIAGNOSIS — E039 Hypothyroidism, unspecified: Secondary | ICD-10-CM

## 2022-04-05 DIAGNOSIS — N390 Urinary tract infection, site not specified: Secondary | ICD-10-CM

## 2022-04-05 LAB — POCT URINALYSIS DIPSTICK
Bilirubin, UA: NEGATIVE
Glucose, UA: NEGATIVE
Nitrite, UA: NEGATIVE
Protein, UA: NEGATIVE
Spec Grav, UA: 1.01 (ref 1.010–1.025)
Urobilinogen, UA: 0.2 E.U./dL
pH, UA: 6.5 (ref 5.0–8.0)

## 2022-04-05 MED ORDER — ROSUVASTATIN CALCIUM 5 MG PO TABS: ORAL_TABLET | ORAL | 3 refills | Status: DC

## 2022-04-05 MED ORDER — NITROFURANTOIN MONOHYD MACRO 100 MG PO CAPS: ORAL_CAPSULE | ORAL | 0 refills | Status: DC

## 2022-04-05 NOTE — Progress Notes (Signed)
[facility] [place], [place] [address]  Internal MEDICINE  Office Visit Note  Patient Name: Marissa Wilson  [id number]  [id number]  Date of Service: 04/05/2022  Chief Complaint  Patient presents with   Hyperlipidemia   Hypothyroidism   Insect Bite    Stung by a S[employer] Caterpillar around 12 pm today    HPI Patient is here for routine follow-up. 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   azelastine (ASTELIN) 0.1 % nasal spray Place 2 sprays into both nostrils 2 (two) times daily. Use in each nostril as directed   furosemide (LASIX) 20 MG tablet TAKE 1 TABLET BY MOUTH EVERY DAY   levothyroxine (SYNTHROID) 75 MCG tablet TAKE 1 TABLET BY MOUTH EVERY DAY   montelukast (SINGULAIR) 10 MG tablet TAKE 1 TABLET BY MOUTH EVERYDAY AT BEDTIME   Multiple Vitamins-Minerals (VITAMIN-MINERAL SUPPLEMENT PO) Take by mouth daily. Mega D-3 & MK-7 5000 IU/180MCG   nitrofurantoin, macrocrystal-monohydrate, (MACROBID) 100 MG capsule Use as instructed   omeprazole (PRILOSEC) 40 MG capsule TAKE 1 CAPSULE (40 MG TOTAL) BY MOUTH DAILY.   rivaroxaban (XARELTO) 10 MG TABS tablet Take 10 mg by mouth at bedtime.   cetirizine (ZYRTEC) 10 MG tablet TAKE 1 TABLET (10 MG TOTAL) BY MOUTH DAILY. FOR ALLERGIES (NOT COVERED) (Patient not taking: Reported on 04/05/2022)   rosuvastatin (CRESTOR) 5 MG tablet TAKE ONE TAB BY  MOUTH twice A WEEK   [DISCONTINUED] azithromycin (ZITHROMAX) 250 MG tablet Use as directed for 5 days (Patient not taking: Reported on 04/05/2022)   [DISCONTINUED] benzonatate (TESSALON) 100 MG capsule Take 1 capsule (100 mg total) by mouth 2 (two) times daily as needed for cough. (Patient not taking: Reported on 04/05/2022)   [DISCONTINUED] Cholecalciferol 1000 units capsule Take 1,000 Units daily by mouth. (Patient not taking: Reported on 04/05/2022)   [DISCONTINUED] guaiFENesin (MUCINEX) 600 MG 12 hr tablet Take 1 tablet (600 mg total) by mouth 2 (two) times daily. (Patient not taking: Reported on 04/05/2022)   [DISCONTINUED] rosuvastatin (CRESTOR) 5 MG tablet TAKE ONE TAB BY MOUTH twice A WEEK (Patient not taking: Reported on 04/05/2022)   [DISCONTINUED] vitamin k 100 MCG tablet Take 100 mcg by mouth daily. (Patient not taking: Reported on 04/05/2022)   No facility-administered encounter medications on file as of 04/05/2022.    Surgical History: Past Surgical History:  Procedure Laterality Date   ABDOMINAL HYSTERECTOMY     ABLATION SAPHENOUS VEIN W/ RFA Right 05/21/2021   COLONOSCOPY WITH PROPOFOL N/A 06/20/2020   Procedure: COLONOSCOPY WITH PROPOFOL;  Surgeon: Robert Bellow, MD;  Location: [facility] ENDOSCOPY;  Service: Endoscopy;  Laterality: N/A;   ESOPHAGOGASTRODUODENOSCOPY (EGD) WITH PROPOFOL N/A 06/20/2020   Procedure: ESOPHAGOGASTRODUODENOSCOPY (EGD) WITH PROPOFOL;  Surgeon: Robert Bellow, MD;  Location: [facility] ENDOSCOPY;  Service: Endoscopy;  Laterality: N/A;  COVID POSITIVE 05/19/20   PR ENDOVENOUS LASTER, 1ST VEIN Right 06/09/2017  Procedure: EVLA of Right GSV; Surgeon: Lauretta Grill, MD; Location: MAIN OR [facility]; Service: Vascular   PR LIGATN LONG SAPHENOUS VEIN AT SEPH-FEM JUNC Right 06/09/2017   Procedure: LIGATION & DIVISION OF LONG SAPHENOUS VEIN AT SAPHENOFEMORAL JUNCTION, OR DISTAL INTERRUPTIONS; Surgeon: Lauretta Grill, MD; Location: MAIN OR [place]; Service:  Vascular   PR PHLEB VEINS EXTREM TO 20 Right 06/09/2017   Procedure: STAB PHLEBECTOMY OF VARICOSE VEINS, 1 EXTREMITY: 10-20 STAB INCISIONS; Surgeon: Lauretta Grill, MD; Location: MA[facility] OR [facility]; Service: Vascular    Medical History: Past Medical History:  Diagnosis Date   Asthma    Clotting disorder ([place])    Gastrointestinal disorder    GERD (gastroesophageal reflux disease)    Hx of melanoma excision    Hyperlipidemia, unspecified    Hypertension    Hypothyroid    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   Osteoarthritis    Sleep apnea    Thyroid disease    UTI (urinary tract infection)     Family History: Family History  Problem Relation Age of Onset   Breast cancer Mother 57   Varicose Veins Mother    Lung cancer Father    Heart attack Father    Bladder Cancer Neg Hx    Kidney cancer Neg Hx     Social History   Socioeconomic History   Marital status: Married    Spouse name: Nada Boozer   Number of children: 2   Years of education: 13   Highest education level: Some college, no degree  Occupational History   Not on file  Tobacco Use   Smoking status: Never   Smokeless tobacco: Never  Vaping Use   Vaping Use: Never used  Substance and Sexual Activity   Alcohol use: Yes    Comment: occasional wine   Drug use: No   Sexual activity: Not on file  Other Topics Concern   Not on file  Social History Narrative   Not on file   Social Determinants of Health   Financial Resource Strain: Not on file  Food Insecurity: Not on file  Transportation Needs: Not on file  Physical Activity: Not on file  Stress: Not on file  Social Connections: Not on file  Intimate Partner Violence: Not on file      Review of Systems  Constitutional:  Negative for chills, fatigue, fever and unexpected weight change.  HENT:  Negative for congestion, mouth sores, postnasal drip, rhinorrhea, sneezing and sore throat.   Eyes:  Negative for redness.   Respiratory:  Negative for cough, chest tightness and shortness of breath.   Cardiovascular:  Negative for chest pain and palpitations.  Gastrointestinal:  Negative for abdominal pain, constipation, diarrhea, nausea and vomiting.  Genitourinary:  Positive for dysuria. Negative for flank pain and frequency.  Musculoskeletal:  Negative for arthralgias, back pain, joint swelling and neck pain.  Skin:  Negative for rash.       Skin growth around right eye   Neurological: Negative.  Negative for tremors and numbness.  Hematological:  Negative for adenopathy. Does not bruise/bleed easily.  Psychiatric/Behavioral: Negative.  Negative for behavioral problems (Depression), sleep disturbance and suicidal ideas. The patient is not nervous/anxious.     Vital Signs: BP 126/74   Pulse 92   Temp 98.3 F (36.8 C)   Resp 16   Ht '5\' 3"'$  (1.6 m)   Wt 160 lb (72.6 kg)   SpO2 98%   BMI 28.34  kg/m    Physical Exam Constitutional:      Appearance: Normal appearance.  HENT:     Head: Normocephalic and atraumatic.     Nose: Nose normal.     Mouth/Throat:     Mouth: Mucous membranes are moist.     Pharynx: No posterior oropharyngeal erythema.  Eyes:     Extraocular Movements: Extraocular movements intact.     Pupils: Pupils are equal, round, and reactive to light.  Cardiovascular:     Pulses: Normal pulses.     Heart sounds: Normal heart sounds.  Pulmonary:     Effort: Pulmonary effort is normal.     Breath sounds: Normal breath sounds.  Skin:    Findings: Lesion present.     Comments: [place]   Neurological:     General: No focal deficit present.     Mental Status: She is alert.  Psychiatric:        Mood and Affect: Mood normal.        Behavior: Behavior normal.        Assessment/Plan: 1. Facial skin lesion She has a small growth along with a comedone on her right upper eyelid will like to see a facial plastic surgeon for it - Ambulatory referral to ENT  2. Mixed  hyperlipidemia We will refill her Crestor 5 mg twice a week patient does have elevated L FTs due to due to high-dose statin - Lipid Panel With LDL/HDL Ratio - rosuvastatin (CRESTOR) 5 MG tablet; TAKE ONE TAB BY MOUTH twice A WEEK  Dispense: 24 tablet; Refill: 3  3. Hypothyroidism, unspecified type Continue Synthroid as before - TSH + free T4  4. Breast cancer screening by mammogram [place] mammograms ordered - MM DIGITAL SCREENING BILATERAL; Future   Urinary tract infection without hematuria, site unspecified Patient has symptoms of dysuria UTI by history on a chronic basis, urine dipstick was positive send cultures and sensitivities while treating with Macrobid - POCT Urinalysis Dipstick - CULTURE, URINE COMPREHENSIVE - nitrofurantoin, macrocrystal-monohydrate, (MACROBID) 100 MG capsule; Use as instructed  Dispense: 20 capsule; Refill: 0  7. Benign hypertension Patient is on diuretic we will continue to check her electrolytes  - Comp. Metabolic Panel (12)   General Counseling: Nuriyah verbalizes understanding of the findings of todays visit and agrees with plan of treatment. I have discussed any further diagnostic evaluation that may be needed or ordered today. We also reviewed her medications today. she has been encouraged to call the office with any questions or concerns that should arise related to todays visit.    Orders Placed This Encounter  Procedures   CULTURE, URINE COMPREHENSIVE   MM DIGITAL SCREENING BILATERAL   Lipid Panel With LDL/HDL Ratio   TSH + free T4   Comp. Metabolic Panel (12)   Ambulatory referral to ENT   POCT Urinalysis Dipstick    Meds ordered this encounter  Medications   rosuvastatin (CRESTOR) 5 MG tablet    Sig: TAKE ONE TAB BY MOUTH twice A WEEK    Dispense:  24 tablet    Refill:  3   nitrofurantoin, macrocrystal-monohydrate, (MACROBID) 100 MG capsule    Sig: Use as instructed    Dispense:  20 capsule    Refill:  0    Total time spent:35  Minutes Time spent includes review of chart, medications, test results, and follow up plan with the patient.    Controlled Substance Database was reviewed by me.   Dr Lavera Guise Internal medicine

## 2022-04-06 ENCOUNTER — Ambulatory Visit: Payer: PPO | Admitting: Dermatology

## 2022-04-06 DIAGNOSIS — D1800 Hemangioma unspecified site: Secondary | ICD-10-CM

## 2022-04-06 DIAGNOSIS — D229 Melanocytic nevi, unspecified: Secondary | ICD-10-CM | POA: Diagnosis not present

## 2022-04-06 DIAGNOSIS — L578 Other skin changes due to chronic exposure to nonionizing radiation: Secondary | ICD-10-CM

## 2022-04-06 DIAGNOSIS — I8393 Asymptomatic varicose veins of bilateral lower extremities: Secondary | ICD-10-CM

## 2022-04-06 DIAGNOSIS — L720 Epidermal cyst: Secondary | ICD-10-CM

## 2022-04-06 DIAGNOSIS — D2271 Melanocytic nevi of right lower limb, including hip: Secondary | ICD-10-CM | POA: Diagnosis not present

## 2022-04-06 DIAGNOSIS — L820 Inflamed seborrheic keratosis: Secondary | ICD-10-CM | POA: Diagnosis not present

## 2022-04-06 DIAGNOSIS — L821 Other seborrheic keratosis: Secondary | ICD-10-CM | POA: Diagnosis not present

## 2022-04-06 DIAGNOSIS — Z86006 Personal history of melanoma in-situ: Secondary | ICD-10-CM

## 2022-04-06 DIAGNOSIS — Z1283 Encounter for screening for malignant neoplasm of skin: Secondary | ICD-10-CM

## 2022-04-06 DIAGNOSIS — L814 Other melanin hyperpigmentation: Secondary | ICD-10-CM

## 2022-04-06 NOTE — Progress Notes (Signed)
Follow-Up Visit   Subjective  Marissa Wilson is a 76 y.o. female who presents for the following: TBSE (The patient presents for Total-Body Skin Exam (TBSE) for skin cancer screening and mole check.  The patient has spots, moles and lesions to be evaluated, some may be new or changing and the patient has concerns that these could be cancer. Patient with hx of MMis. Patient does have a spot at right eyelid, present for about 8 weeks. ).  It is growing and is bothersome.  She also has bumps around her eyes, upper abdomen, and arm that get irritated.    The following portions of the chart were reviewed this encounter and updated as appropriate:       Review of Systems:  No other skin or systemic complaints except as noted in HPI or Assessment and Plan.  Objective  Well appearing patient in no apparent distress; mood and affect are within normal limits.  A full examination was performed including scalp, head, eyes, ears, nose, lips, neck, chest, axillae, abdomen, back, buttocks, bilateral upper extremities, bilateral lower extremities, hands, feet, fingers, toes, fingernails, and toenails. All findings within normal limits unless otherwise noted below.  right lateral upper eyelid at mucosal margin 4 mm firm white papule/nodule  right lateral eye Smooth white papule(s).   Right Hip 2 mm medium dark brown macule  bilateral eyelids x 8, right anticubitum, inframammary x 14 (23) Pedunculated waxy flesh tan papules    Assessment & Plan  Epidermal inclusion cyst right lateral upper eyelid at mucosal margin  Benign-appearing. Exam most consistent with an epidermal inclusion cyst. Discussed that a cyst is a benign growth that can grow over time and sometimes get irritated or inflamed. Recommend observation if it is not bothersome. Patient states spot gets irritated and would like it removed.   Incision and Drainage - right lateral upper eyelid at mucosal margin Location: right  lateral upper eyelid at margin  Informed Consent: Discussed risks (permanent scarring, light or dark discoloration, infection, pain, bleeding, bruising, redness, damage to adjacent structures, and recurrence of the lesion) and benefits of the procedure, as well as the alternatives.  Informed consent was obtained.  Preparation: The area was prepped with alcohol swab.  Anesthesia: Lidocaine 1% with epinephrine  Procedure Details: An incision was made overlying the lesion. The lesion drained white, creamy cyst material.  A small amount of fluid was drained.    Vaseline ointment was applied. The patient tolerated procedure well.  Total number of lesions drained: 1  Plan: The patient was instructed on post-op care. Recommend OTC analgesia as needed for pain.  [place] right lateral eye  Nevus Right Hip  Benign-appearing.  Observation.  Call clinic for new or changing lesions.  Recommend daily use of broad spectrum spf 30+ sunscreen to sun-exposed areas.    Inflamed seborrheic keratosis (23) bilateral eyelids x 8, right anticubitum, inframammary x 14  Vrs irritated skin tags.  Symptomatic, irritating, patient would like treated.   Destruction of lesion - bilateral eyelids x 8, right anticubitum, inframammary x 14  Destruction method: cryotherapy   Informed consent: discussed and consent obtained   Lesion destroyed using liquid nitrogen: Yes   Region frozen until ice ball extended beyond lesion: Yes   Outcome: patient tolerated procedure well with no complications   Post-procedure details: wound care instructions given   Additional details:  Prior to procedure, discussed risks of blister formation, small wound, skin dyspigmentation, or rare scar following cryotherapy. Recommend Vaseline ointment  to treated areas while healing.    History of Melanoma in Situ, Lentigo Maligna type - No evidence of recurrence today at left cheek - Recommend regular full body skin exams - Recommend  daily broad spectrum sunscreen SPF 30+ to sun-exposed areas, reapply every 2 hours as needed.  - Call if any new or changing lesions are noted between office visits  Lentigines - Scattered tan macules - Due to sun exposure - Benign-appearing, observe - Recommend daily broad spectrum sunscreen SPF 30+ to sun-exposed areas, reapply every 2 hours as needed. - Call for any changes  Seborrheic Keratoses - Stuck-on, waxy, tan-brown papules and/or plaques  - Benign-appearing - Discussed benign etiology and prognosis. - Observe - Call for any changes  Melanocytic Nevi - Tan-brown and/or pink-flesh-colored symmetric macules and papules - Benign appearing on exam today - Observation - Call clinic for new or changing moles - Recommend daily use of broad spectrum spf 30+ sunscreen to sun-exposed areas.   Hemangiomas - Red papules - Discussed benign nature - Observe - Call for any changes  Actinic Damage - Chronic condition, secondary to cumulative UV/sun exposure - diffuse scaly erythematous macules with underlying dyspigmentation - Recommend daily broad spectrum sunscreen SPF 30+ to sun-exposed areas, reapply every 2 hours as needed.  - Staying in the shade or wearing long sleeves, sun glasses (UVA+UVB protection) and wide brim hats (4-inch brim around the entire circumference of the hat) are also recommended for sun protection.  - Call for new or changing lesions.  Skin cancer screening performed today.  Varicose Veins/Spider Veins - Dilated blue, purple or red veins at the lower extremities - Reassured - Smaller vessels can be treated by sclerotherapy (a procedure to inject a medicine into the veins to make them disappear) if desired, but the treatment is not covered by insurance. Larger vessels may be covered if symptomatic and we would refer to vascular surgeon if treatment desired.  Return in about 1 year (around 04/07/2023) for TBSE.  Graciella Belton, RMA, am acting as  scribe for Brendolyn Patty, MD .  Documentation: I have reviewed the above documentation for accuracy and completeness, and I agree with the above.  Brendolyn Patty MD

## 2022-04-06 NOTE — Patient Instructions (Signed)
Cryotherapy Aftercare  Wash gently with soap and water everyday.   Apply Vaseline and Band-Aid daily until healed.   Melanoma ABCDEs  Melanoma is the most dangerous type of skin cancer, and is the leading cause of death from skin disease.  You are more likely to develop melanoma if you: Have light-colored skin, light-colored eyes, or red or blond hair Spend a lot of time in the sun Tan regularly, either outdoors or in a tanning bed Have had blistering sunburns, especially during childhood Have a close family member who has had a melanoma Have atypical moles or large birthmarks  Early detection of melanoma is key since treatment is typically straightforward and cure rates are extremely high if we catch it early.   The first sign of melanoma is often a change in a mole or a new dark spot.  The ABCDE system is a way of remembering the signs of melanoma.  A for asymmetry:  The two halves do not match. B for border:  The edges of the growth are irregular. C for color:  A mixture of colors are present instead of an even brown color. D for diameter:  Melanomas are usually (but not always) greater than 6mm - the size of a pencil eraser. E for evolution:  The spot keeps changing in size, shape, and color.  Please check your skin once per month between visits. You can use a small mirror in front and a large mirror behind you to keep an eye on the back side or your body.   If you see any new or changing lesions before your next follow-up, please call to schedule a visit.  Please continue daily skin protection including broad spectrum sunscreen SPF 30+ to sun-exposed areas, reapplying every 2 hours as needed when you're outdoors.     Due to recent changes in healthcare laws, you may see results of your pathology and/or laboratory studies on MyChart before the doctors have had a chance to review them. We understand that in some cases there may be results that are confusing or concerning to you.  Please understand that not all results are received at the same time and often the doctors may need to interpret multiple results in order to provide you with the best plan of care or course of treatment. Therefore, we ask that you please give us 2 business days to thoroughly review all your results before contacting the office for clarification. Should we see a critical lab result, you will be contacted sooner.   If You Need Anything After Your Visit  If you have any questions or concerns for your doctor, please call our main line at [phone] and press option 4 to reach your doctor's medical assistant. If no one answers, please leave a voicemail as directed and we will return your call as soon as possible. Messages left after 4 pm will be answered the following business day.   You may also send us a message via MyChart. We typically respond to MyChart messages within 1-2 business days.  For prescription refills, please ask your pharmacy to contact our office. Our fax number is [phone].  If you have an urgent issue when the clinic is closed that cannot wait until the next business day, you can page your doctor at the number below.    Please note that while we do our best to be available for urgent issues outside of office hours, we are not available 24/7.   If you have an urgent   issue and are unable to reach us, you may choose to seek medical care at your doctor's office, retail clinic, urgent care center, or emergency room.  If you have a medical emergency, please immediately call 911 or go to the emergency department.  Pager Numbers  - Dr. Kowalski: [phone]  - Dr. Moye: [phone]  - Dr. Stewart: [phone]  In the event of inclement weather, please call our main line at [phone] for an update on the status of any delays or closures.  Dermatology Medication Tips: Please keep the boxes that topical medications come in in order to help keep track of the  instructions about where and how to use these. Pharmacies typically print the medication instructions only on the boxes and not directly on the medication tubes.   If your medication is too expensive, please contact our office at [phone] option 4 or send us a message through MyChart.   We are unable to tell what your co-pay for medications will be in advance as this is different depending on your insurance coverage. However, we may be able to find a substitute medication at lower cost or fill out paperwork to get insurance to cover a needed medication.   If a prior authorization is required to get your medication covered by your insurance company, please allow us 1-2 business days to complete this process.  Drug prices often vary depending on where the prescription is filled and some pharmacies may offer cheaper prices.  The website www.goodrx.com contains coupons for medications through different pharmacies. The prices here do not account for what the cost may be with help from insurance (it may be cheaper with your insurance), but the website can give you the price if you did not use any insurance.  - You can print the associated coupon and take it with your prescription to the pharmacy.  - You may also stop by our office during regular business hours and pick up a GoodRx coupon card.  - If you need your prescription sent electronically to a different pharmacy, notify our office through [facility] MyChart or by phone at [phone] option 4.     Si Usted Necesita Algo Despus de Su Visita  Tambin puede enviarnos un mensaje a travs de MyChart. Por lo general respondemos a los mensajes de MyChart en el transcurso de 1 a 2 das hbiles.  Para renovar recetas, por favor pida a su farmacia que se ponga en contacto con nuestra oficina. Nuestro nmero de fax es el [phone].  Si tiene un asunto urgente cuando la clnica est cerrada y que no puede esperar hasta el siguiente da hbil,  puede llamar/localizar a su doctor(a) al nmero que aparece a continuacin.   Por favor, tenga en cuenta que aunque hacemos todo lo posible para estar disponibles para asuntos urgentes fuera del horario de oficina, no estamos disponibles las 24 horas del da, los 7 das de la semana.   Si tiene un problema urgente y no puede comunicarse con nosotros, puede optar por buscar atencin mdica  en el consultorio de su doctor(a), en una clnica privada, en un centro de atencin urgente o en una sala de emergencias.  Si tiene una emergencia mdica, por favor llame inmediatamente al 911 o vaya a la sala de emergencias.  Nmeros de bper  - Dr. Kowalski: [phone]  - Dra. Moye: [phone]  - Dra. Stewart: [phone]  En caso de inclemencias del tiempo, por favor llame a nuestra lnea principal al [phone] para una actualizacin   sobre el estado de cualquier retraso o cierre.  Consejos para la medicacin en dermatologa: Por favor, guarde las cajas en las que vienen los medicamentos de uso tpico para ayudarle a seguir las instrucciones sobre dnde y cmo usarlos. Las farmacias generalmente imprimen las instrucciones del medicamento slo en las cajas y no directamente en los tubos del medicamento.   Si su medicamento es muy caro, por favor, pngase en contacto con nuestra oficina llamando al [phone] y presione la opcin 4 o envenos un mensaje a travs de MyChart.   No podemos decirle cul ser su copago por los medicamentos por adelantado ya que esto es diferente dependiendo de la cobertura de su seguro. Sin embargo, es posible que podamos encontrar un medicamento sustituto a menor costo o llenar un formulario para que el seguro cubra el medicamento que se considera necesario.   Si se requiere una autorizacin previa para que su compaa de seguros cubra su medicamento, por favor permtanos de 1 a 2 das hbiles para completar este proceso.  Los precios de los medicamentos varan con  frecuencia dependiendo del lugar de dnde se surte la receta y alguna farmacias pueden ofrecer precios ms baratos.  El sitio web www.goodrx.com tiene cupones para medicamentos de diferentes farmacias. Los precios aqu no tienen en cuenta lo que podra costar con la ayuda del seguro (puede ser ms barato con su seguro), pero el sitio web puede darle el precio si no utiliz ningn seguro.  - Puede imprimir el cupn correspondiente y llevarlo con su receta a la farmacia.  - Tambin puede pasar por nuestra oficina durante el horario de atencin regular y recoger una tarjeta de cupones de GoodRx.  - Si necesita que su receta se enve electrnicamente a una farmacia diferente, informe a nuestra oficina a travs de MyChart de [facility] o por telfono llamando al [phone] y presione la opcin 4.  

## 2022-04-12 LAB — CULTURE, URINE COMPREHENSIVE

## 2022-04-13 ENCOUNTER — Telehealth: Payer: Self-pay

## 2022-04-13 NOTE — Telephone Encounter (Signed)
ENT referral faxed to [facility] Dr. Dannette Barbara

## 2022-04-20 ENCOUNTER — Other Ambulatory Visit: Payer: Self-pay

## 2022-04-20 ENCOUNTER — Telehealth: Payer: Self-pay

## 2022-04-20 DIAGNOSIS — R300 Dysuria: Secondary | ICD-10-CM

## 2022-04-20 MED ORDER — LEVOFLOXACIN 500 MG PO TABS: 500.0000 mg | ORAL_TABLET | Freq: Every day | ORAL | 0 refills | Status: DC

## 2022-04-20 NOTE — Telephone Encounter (Signed)
Pt called asking if we can order another culture as she was still having UTI symptoms.  Per Dr Humphrey Rolls she sent in Levaquin 500 mg for 7 days and once she completes the abx she needs to go have a culture done.  Pt understood

## 2022-04-20 NOTE — Telephone Encounter (Signed)
Pt called and was still having symptoms of UTI and was requesting a culture to be done per Dr Humphrey Rolls we sent in Levaquin x 7 days and pt is to get a culture done after finishing the Levaquin pt understood.

## 2022-05-03 DIAGNOSIS — R300 Dysuria: Secondary | ICD-10-CM | POA: Diagnosis not present

## 2022-05-07 LAB — CULTURE, URINE COMPREHENSIVE

## 2022-05-12 ENCOUNTER — Other Ambulatory Visit: Payer: Self-pay | Admitting: Internal Medicine

## 2022-05-12 DIAGNOSIS — I83893 Varicose veins of bilateral lower extremities with other complications: Secondary | ICD-10-CM

## 2022-05-19 ENCOUNTER — Other Ambulatory Visit: Payer: Self-pay | Admitting: General Surgery

## 2022-05-19 DIAGNOSIS — K862 Cyst of pancreas: Secondary | ICD-10-CM

## 2022-05-24 ENCOUNTER — Telehealth (INDEPENDENT_AMBULATORY_CARE_PROVIDER_SITE_OTHER): Payer: PPO | Admitting: Physician Assistant

## 2022-05-24 VITALS — Temp 97.4°F | Ht 62.5 in | Wt 150.0 lb

## 2022-05-24 DIAGNOSIS — J0100 Acute maxillary sinusitis, unspecified: Secondary | ICD-10-CM

## 2022-05-24 MED ORDER — AZITHROMYCIN 250 MG PO TABS: ORAL_TABLET | ORAL | 0 refills | Status: DC

## 2022-05-24 NOTE — Progress Notes (Signed)
[facility] [place], [place] [address]  Internal MEDICINE  Telephone Visit  Patient Name: Marissa Wilson  [id number]  [id number]  Date of Service: 05/24/2022  I connected with the patient at 11:53 by telephone and verified the patients identity using two identifiers.   I discussed the limitations, risks, security and privacy concerns of performing an evaluation and management service by telephone and the availability of in person appointments. I also discussed with the patient that there may be a patient responsible charge related to the service.  The patient expressed understanding and agrees to proceed.    Chief Complaint  Patient presents with   Telephone Assessment    [id number]   Telephone Screen    Covid test negative    Sinusitis   Sore Throat   Cough    Going on for 4 days     HPI Pt is here for virtual sick visit -She has has cough, congestion, sore throat, and sinus headache for 4 days now. A little wheezing last night -Her covid test was negative -Denies fever or chills currently. No SOB -Taking tylenol as needed.  Current Medication: Outpatient Encounter Medications as of 05/24/2022  Medication Sig   acetaminophen (TYLENOL) 650 MG CR tablet Take 650 mg every 6 (six) hours by mouth.   azelastine (ASTELIN) 0.1 % nasal spray Place 2 sprays into both nostrils 2 (two) times daily. Use in each nostril as directed   azithromycin (ZITHROMAX) 250 MG tablet Take one tab a day for 10 days for uri   cetirizine (ZYRTEC) 10 MG tablet TAKE 1 TABLET (10 MG TOTAL) BY MOUTH DAILY. FOR ALLERGIES (NOT COVERED)   furosemide (LASIX) 20 MG tablet TAKE 1 TABLET BY MOUTH EVERY DAY   levothyroxine (SYNTHROID) 75 MCG tablet TAKE 1 TABLET BY MOUTH EVERY DAY   montelukast (SINGULAIR) 10 MG tablet TAKE 1 TABLET BY MOUTH EVERYDAY AT BEDTIME   Multiple Vitamins-Minerals (VITAMIN-MINERAL SUPPLEMENT PO) Take by mouth daily. Mega D-3 & MK-7 5000 IU/180MCG   omeprazole  (PRILOSEC) 40 MG capsule TAKE 1 CAPSULE (40 MG TOTAL) BY MOUTH DAILY.   rivaroxaban (XARELTO) 10 MG TABS tablet Take 10 mg by mouth at bedtime.   rosuvastatin (CRESTOR) 5 MG tablet TAKE ONE TAB BY MOUTH twice A WEEK   [DISCONTINUED] levofloxacin (LEVAQUIN) 500 MG tablet Take 1 tablet (500 mg total) by mouth daily. For 7 days   [DISCONTINUED] nitrofurantoin, macrocrystal-monohydrate, (MACROBID) 100 MG capsule Use as instructed   No facility-administered encounter medications on file as of 05/24/2022.    Surgical History: Past Surgical History:  Procedure Laterality Date   ABDOMINAL HYSTERECTOMY     ABLATION SAPHENOUS VEIN W/ RFA Right 05/21/2021   COLONOSCOPY WITH PROPOFOL N/A 06/20/2020   Procedure: COLONOSCOPY WITH PROPOFOL;  Surgeon: Robert Bellow, MD;  Location: [facility] ENDOSCOPY;  Service: Endoscopy;  Laterality: N/A;   ESOPHAGOGASTRODUODENOSCOPY (EGD) WITH PROPOFOL N/A 06/20/2020   Procedure: ESOPHAGOGASTRODUODENOSCOPY (EGD) WITH PROPOFOL;  Surgeon: Robert Bellow, MD;  Location: [facility] ENDOSCOPY;  Service: Endoscopy;  Laterality: N/A;  COVID POSITIVE 05/19/20   PR ENDOVENOUS LASTER, 1ST VEIN Right 06/09/2017   Procedure: EVLA of Right GSV; Surgeon: Lauretta Grill, MD; Location: MAIN OR [facility]; Service: Vascular   PR LIGATN LONG SAPHENOUS VEIN AT SEPH-FEM JUNC Right 06/09/2017   Procedure: LIGATION & DIVISION OF LONG SAPHENOUS VEIN AT SAPHENOFEMORAL JUNCTION, OR DISTAL INTERRUPTIONS; Surgeon: Lauretta Grill, MD; Location: MAIN OR [facility]; Service: Vascular   PR PHLEB VEINS EXTREM TO  20 Right 06/09/2017   Procedure: STAB PHLEBECTOMY OF VARICOSE VEINS, 1 EXTREMITY: 10-20 STAB INCISIONS; Surgeon: Lauretta Grill, MD; Location: MAIN OR [facility]; Service: Vascular    Medical History: Past Medical History:  Diagnosis Date   Asthma    Clotting disorder ([place])    Gastrointestinal disorder    GERD (gastroesophageal reflux disease)    Hx of melanoma excision     Hyperlipidemia, unspecified    Hypertension    Hypothyroid    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   Osteoarthritis    Sleep apnea    Thyroid disease    UTI (urinary tract infection)     Family History: Family History  Problem Relation Age of Onset   Breast cancer Mother 81   Varicose Veins Mother    Lung cancer Father    Heart attack Father    Bladder Cancer Neg Hx    Kidney cancer Neg Hx     Social History   Socioeconomic History   Marital status: Married    Spouse name: Nada Boozer   Number of children: 2   Years of education: 13   Highest education level: Some college, no degree  Occupational History   Not on file  Tobacco Use   Smoking status: Never   Smokeless tobacco: Never  Vaping Use   Vaping Use: Never used  Substance and Sexual Activity   Alcohol use: Yes    Comment: occasional wine   Drug use: No   Sexual activity: Not on file  Other Topics Concern   Not on file  Social History Narrative   Not on file   Social Determinants of Health   Financial Resource Strain: Not on file  Food Insecurity: Not on file  Transportation Needs: Not on file  Physical Activity: Not on file  Stress: Not on file  Social Connections: Not on file  Intimate Partner Violence: Not on file      Review of Systems  Constitutional:  Negative for chills, fatigue and fever.  HENT:  Positive for congestion, postnasal drip, sinus pressure, sinus pain and sore throat. Negative for mouth sores.   Respiratory:  Positive for cough and wheezing. Negative for shortness of breath.   Cardiovascular:  Negative for chest pain.  Genitourinary:  Negative for flank pain.  Neurological:  Positive for headaches.  Psychiatric/Behavioral: Negative.      Vital Signs: Temp (!) 97.4 F (36.3 C)   Ht 5' 2.5" (1.588 m)   Wt 150 lb (68 kg)   BMI 27.00 kg/m    Observation/Objective:  Pt is able to carry out conversation.  Assessment/Plan: 1. Acute  non-recurrent maxillary sinusitis Will start on zpak and advised to use nasal spray such as flonase to help with postnasal drip. Advised to rest and stay well hydrated. - azithromycin (ZITHROMAX) 250 MG tablet; Take one tab a day for 10 days for uri  Dispense: 10 tablet; Refill: 0   General Counseling: Estoria verbalizes understanding of the findings of today's phone visit and agrees with plan of treatment. I have discussed any further diagnostic evaluation that may be needed or ordered today. We also reviewed her medications today. she has been encouraged to call the office with any questions or concerns that should arise related to todays visit.    No orders of the defined types were placed in this encounter.   Meds ordered this encounter  Medications   azithromycin (ZITHROMAX) 250 MG tablet  Sig: Take one tab a day for 10 days for uri    Dispense:  10 tablet    Refill:  0    Time spent:25 Minutes    Dr Lavera Guise Internal medicine

## 2022-06-17 ENCOUNTER — Ambulatory Visit
Admission: RE | Admit: 2022-06-17 | Discharge: 2022-06-17 | Disposition: A | Discharge status: Home / Self Care | Payer: PPO | Source: Ambulatory Visit | Attending: Internal Medicine | Admitting: Internal Medicine

## 2022-06-17 DIAGNOSIS — Z1231 Encounter for screening mammogram for malignant neoplasm of breast: Secondary | ICD-10-CM | POA: Diagnosis not present

## 2022-06-21 ENCOUNTER — Ambulatory Visit
Admission: RE | Admit: 2022-06-21 | Discharge: 2022-06-21 | Disposition: A | Discharge status: Home / Self Care | Payer: PPO | Source: Ambulatory Visit | Attending: General Surgery | Admitting: General Surgery

## 2022-06-21 DIAGNOSIS — K862 Cyst of pancreas: Secondary | ICD-10-CM | POA: Insufficient documentation

## 2022-06-21 DIAGNOSIS — K449 Diaphragmatic hernia without obstruction or gangrene: Secondary | ICD-10-CM | POA: Diagnosis not present

## 2022-06-21 MED ORDER — GADOBUTROL 1 MMOL/ML IV SOLN
7.0000 mL | Freq: Once | INTRAVENOUS | Status: AC | PRN
  Administered 2022-06-21: 7 mL via INTRAVENOUS

## 2022-06-24 DIAGNOSIS — K862 Cyst of pancreas: Secondary | ICD-10-CM | POA: Diagnosis not present

## 2022-08-18 ENCOUNTER — Other Ambulatory Visit: Payer: Self-pay | Admitting: Internal Medicine

## 2022-08-19 ENCOUNTER — Ambulatory Visit: Payer: PPO | Admitting: Nurse Practitioner

## 2022-08-26 DIAGNOSIS — Z5181 Encounter for therapeutic drug level monitoring: Secondary | ICD-10-CM | POA: Diagnosis not present

## 2022-09-06 ENCOUNTER — Encounter: Payer: Self-pay | Admitting: Nurse Practitioner

## 2022-09-06 ENCOUNTER — Ambulatory Visit (INDEPENDENT_AMBULATORY_CARE_PROVIDER_SITE_OTHER): Payer: PPO | Admitting: Nurse Practitioner

## 2022-09-06 VITALS — BP 141/71 | HR 76 | Temp 97.0°F | Resp 16 | Ht 63.0 in | Wt 162.4 lb

## 2022-09-06 DIAGNOSIS — Z0001 Encounter for general adult medical examination with abnormal findings: Secondary | ICD-10-CM

## 2022-09-06 DIAGNOSIS — E782 Mixed hyperlipidemia: Secondary | ICD-10-CM | POA: Diagnosis not present

## 2022-09-06 DIAGNOSIS — R300 Dysuria: Secondary | ICD-10-CM

## 2022-09-06 DIAGNOSIS — G720 Drug-induced myopathy: Secondary | ICD-10-CM | POA: Diagnosis not present

## 2022-09-06 DIAGNOSIS — E039 Hypothyroidism, unspecified: Secondary | ICD-10-CM

## 2022-09-06 DIAGNOSIS — T466X5A Adverse effect of antihyperlipidemic and antiarteriosclerotic drugs, initial encounter: Secondary | ICD-10-CM

## 2022-09-06 MED ORDER — CETIRIZINE HCL 10 MG PO TABS: ORAL_TABLET | ORAL | 3 refills | Status: AC

## 2022-09-06 MED ORDER — FUROSEMIDE 20 MG PO TABS: 20.0000 mg | ORAL_TABLET | Freq: Every day | ORAL | 1 refills | Status: DC

## 2022-09-06 NOTE — Progress Notes (Signed)
[facility] [place], [place] [address]  Internal MEDICINE  Office Visit Note  Patient Name: Marissa Wilson  [id number]  [id number]  Date of Service: 09/06/2022  Chief Complaint  Patient presents with   Hyperlipidemia   Hypertension   Gastroesophageal Reflux   Medicare Wellness    HPI Marissa Wilson presents for an annual well visit and physical exam.  Well-appearing 77 y.o. female with hyperlipidemia, varicose Routine mammogram: done in November 2023 Labs: due for some routine labs  New or worsening pain: none Other concerns: none No significant changes in home life.        09/06/2022   11:06 AM 09/01/2021   11:13 AM 08/28/2020    3:00 PM  MMSE - Mini Mental State Exam  Orientation to time '5 5 5  '$ Orientation to Place '5 5 5  '$ Registration '3 3 3  '$ Attention/ Calculation '5 5 5  '$ Recall '3 3 3  '$ Language- name 2 objects '2 2 2  '$ Language- repeat '1 1 1  '$ Language- follow 3 step command '3 3 3  '$ Language- read & follow direction '1 1 1  '$ Write a sentence '1 1 1  '$ Copy design '1 1 1  '$ Total score '30 30 30    '$ Functional Status Survey: Is the patient deaf or have difficulty hearing?: No Does the patient have difficulty seeing, even when wearing glasses/contacts?: No Does the patient have difficulty concentrating, remembering, or making decisions?: No Does the patient have difficulty walking or climbing stairs?: No Does the patient have difficulty dressing or bathing?: No Does the patient have difficulty doing errands alone such as visiting a doctor's office or shopping?: No     02/12/2021   11:45 AM 05/18/2021    3:30 PM 09/01/2021   11:12 AM 04/05/2022    2:52 PM 09/06/2022   11:04 AM  [place] in the past year? 0 0 0 0 0  Was there an injury with Fall?     0  Fall Risk Category Calculator     0  (RETIRED) Patient Fall Risk Level   Low fall risk    Patient at Risk for Falls Due to No Fall Risks  No Fall Risks  No Fall Risks  Fall risk Follow up Falls  evaluation completed  Falls evaluation completed  Falls evaluation completed       09/06/2022   11:04 AM  Depression screen PHQ 2/9  Decreased Interest 0  Down, Depressed, Hopeless 0  PHQ - 2 Score 0        No data to display            Current Medication: Outpatient Encounter Medications as of 09/06/2022  Medication Sig   acetaminophen (TYLENOL) 650 MG CR tablet Take 650 mg every 6 (six) hours by mouth.   azelastine (ASTELIN) 0.1 % nasal spray Place 2 sprays into both nostrils 2 (two) times daily. Use in each nostril as directed   azithromycin (ZITHROMAX) 250 MG tablet Take one tab a day for 10 days for uri   levothyroxine (SYNTHROID) 75 MCG tablet TAKE 1 TABLET BY MOUTH EVERY DAY   montelukast (SINGULAIR) 10 MG tablet TAKE 1 TABLET BY MOUTH EVERYDAY AT BEDTIME   Multiple Vitamins-Minerals (VITAMIN-MINERAL SUPPLEMENT PO) Take by mouth daily. Mega D-3 & MK-7 5000 IU/180MCG   omeprazole (PRILOSEC) 40 MG capsule TAKE 1 CAPSULE (40 MG TOTAL) BY MOUTH DAILY.   rivaroxaban (XARELTO) 10 MG TABS tablet Take 10 mg by mouth at  bedtime.   [DISCONTINUED] cetirizine (ZYRTEC) 10 MG tablet TAKE 1 TABLET (10 MG TOTAL) BY MOUTH DAILY. FOR ALLERGIES (NOT COVERED)   [DISCONTINUED] furosemide (LASIX) 20 MG tablet TAKE 1 TABLET BY MOUTH EVERY DAY   cetirizine (ZYRTEC) 10 MG tablet TAKE 1 TABLET (10 MG TOTAL) BY MOUTH DAILY. FOR ALLERGIES (NOT COVERED)   furosemide (LASIX) 20 MG tablet Take 1 tablet (20 mg total) by mouth daily.   [DISCONTINUED] rosuvastatin (CRESTOR) 5 MG tablet TAKE ONE TAB BY MOUTH twice A WEEK (Patient not taking: Reported on 09/06/2022)   No facility-administered encounter medications on file as of 09/06/2022.    Surgical History: Past Surgical History:  Procedure Laterality Date   ABDOMINAL HYSTERECTOMY     ABLATION SAPHENOUS VEIN W/ RFA Right 05/21/2021   COLONOSCOPY WITH PROPOFOL N/A 06/20/2020   Procedure: COLONOSCOPY WITH PROPOFOL;  Surgeon: Robert Bellow, MD;   Location: [facility] ENDOSCOPY;  Service: Endoscopy;  Laterality: N/A;   ESOPHAGOGASTRODUODENOSCOPY (EGD) WITH PROPOFOL N/A 06/20/2020   Procedure: ESOPHAGOGASTRODUODENOSCOPY (EGD) WITH PROPOFOL;  Surgeon: Robert Bellow, MD;  Location: [facility] ENDOSCOPY;  Service: Endoscopy;  Laterality: N/A;  COVID POSITIVE 05/19/20   PR ENDOVENOUS LASTER, 1ST VEIN Right 06/09/2017   Procedure: EVLA of Right GSV; Surgeon: Lauretta Grill, MD; Location: MAIN OR [facility]; Service: Vascular   PR LIGATN LONG SAPHENOUS VEIN AT SEPH-FEM JUNC Right 06/09/2017   Procedure: LIGATION & DIVISION OF LONG SAPHENOUS VEIN AT SAPHENOFEMORAL JUNCTION, OR DISTAL INTERRUPTIONS; Surgeon: Lauretta Grill, MD; Location: MAIN OR [facility]; Service: Vascular   PR PHLEB VEINS EXTREM TO 20 Right 06/09/2017   Procedure: STAB PHLEBECTOMY OF VARICOSE VEINS, 1 EXTREMITY: 10-20 STAB INCISIONS; Surgeon: Lauretta Grill, MD; Location[facility]; Service: Vascular    Medical History: Past Medical History:  Diagnosis Date   Asthma    Clotting disorder ([place])    Gastrointestinal disorder    GERD (gastroesophageal reflux disease)    Hx of melanoma excision    Hyperlipidemia, unspecified    Hypertension    Hypothyroid    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   Osteoarthritis    Sleep apnea    Thyroid disease    UTI (urinary tract infection)     Family History: Family History  Problem Relation Age of Onset   Breast cancer Mother 84   Varicose Veins Mother    Lung cancer Father    Heart attack Father    Bladder Cancer Neg Hx    Kidney cancer Neg Hx     Social History   Socioeconomic History   Marital status: Married    Spouse name: Nada Boozer   Number of children: 2   Years of education: 13   Highest education level: Some college, no degree  Occupational History   Not on file  Tobacco Use   Smoking status: Never   Smokeless tobacco: Never  Vaping Use   Vaping Use: Never used   Substance and Sexual Activity   Alcohol use: Yes    Comment: occasional wine   Drug use: No   Sexual activity: Not on file  Other Topics Concern   Not on file  Social History Narrative   Not on file   Social Determinants of Health   Financial Resource Strain: Not on file  Food Insecurity: Not on file  Transportation Needs: Not on file  Physical Activity: Not on file  Stress: Not on file  Social Connections: Not on file  Intimate Partner Violence: Not on file      Review of Systems  Constitutional:  Negative for activity change, appetite change, chills, fatigue, fever and unexpected weight change.  HENT:  Negative for congestion, ear pain, postnasal drip, rhinorrhea, sinus pressure, sinus pain, sore throat and trouble swallowing.   Eyes: Negative.   Respiratory:  Negative for cough, chest tightness, shortness of breath and wheezing.   Cardiovascular: Negative.  Negative for chest pain and palpitations.  Gastrointestinal: Negative.  Negative for abdominal pain, blood in stool, constipation, diarrhea, nausea and vomiting.  Endocrine: Negative.   Genitourinary: Negative.  Negative for difficulty urinating, dysuria, frequency, hematuria and urgency.  Musculoskeletal: Negative.  Negative for arthralgias, back pain, joint swelling, myalgias and neck pain.  Skin: Negative.  Negative for rash and wound.  Allergic/Immunologic: Negative.  Negative for immunocompromised state.  Neurological: Negative.  Negative for dizziness, seizures, numbness and headaches.  Hematological: Negative.   Psychiatric/Behavioral: Negative.  Negative for behavioral problems, self-injury and suicidal ideas. The patient is not nervous/anxious.     Vital Signs: BP (!) 141/71   Pulse 76   Temp (!) 97 F (36.1 C)   Resp 16   Ht '5\' 3"'$  (1.6 m)   Wt 162 lb 6.4 oz (73.7 kg)   SpO2 97%   BMI 28.77 kg/m    Physical Exam Vitals reviewed.  Constitutional:      General: She is awake. She is not in acute  distress.    Appearance: Normal appearance. She is well-developed, well-groomed and normal weight. She is not ill-appearing or diaphoretic.  HENT:     Head: Normocephalic and atraumatic.     Right Ear: Tympanic membrane, ear canal and external ear normal.     Left Ear: Tympanic membrane, ear canal and external ear normal.     Nose: Nose normal. No congestion or rhinorrhea.     Mouth/Throat:     Lips: Pink.     Mouth: Mucous membranes are moist.     Pharynx: Oropharynx is clear. Uvula midline. No oropharyngeal exudate or posterior oropharyngeal erythema.  Eyes:     General: Lids are normal. Vision grossly intact. Gaze aligned appropriately. No scleral icterus.       Right eye: No discharge.        Left eye: No discharge.     Extraocular Movements: Extraocular movements intact.     Conjunctiva/sclera: Conjunctivae normal.     Pupils: Pupils are equal, round, and reactive to light.     Funduscopic exam:    Right eye: Red reflex present.        Left eye: Red reflex present. Neck:     Thyroid: Thyromegaly present.     Vascular: No JVD.     Trachea: Trachea and phonation normal. No tracheal deviation.  Cardiovascular:     Rate and Rhythm: Normal rate and regular rhythm.     Pulses: Normal pulses.     Heart sounds: Normal heart sounds, S1 normal and S2 normal. No murmur heard.    No friction rub. No gallop.  Pulmonary:     Effort: Pulmonary effort is normal. No accessory muscle usage or respiratory distress.     Breath sounds: Normal breath sounds and air entry. No stridor. No wheezing or rales.  Chest:     Chest wall: No tenderness.     Comments: Declined breast exam Abdominal:     General: Bowel sounds are normal. There is no distension.     Palpations: Abdomen is soft.  There is no shifting dullness, fluid wave, mass or pulsatile mass.     Tenderness: There is no abdominal tenderness. There is no guarding or rebound.  Musculoskeletal:        General: No tenderness or deformity.  Normal range of motion.     Cervical back: Normal range of motion and neck supple.     Right lower leg: No edema.     Left lower leg: No edema.  Lymphadenopathy:     Cervical: No cervical adenopathy.  Skin:    General: Skin is warm and dry.     Capillary Refill: Capillary refill takes less than 2 seconds.     Coloration: Skin is not pale.     Findings: No erythema or rash.  Neurological:     Mental Status: She is alert and oriented to person, place, and time.     Cranial Nerves: No cranial nerve deficit.     Motor: No abnormal muscle tone.     Coordination: Coordination normal.     Gait: Gait normal.     Deep Tendon Reflexes: Reflexes are normal and symmetric.  Psychiatric:        Mood and Affect: Mood normal.        Behavior: Behavior normal. Behavior is cooperative.        Thought Content: Thought content normal.        Judgment: Judgment normal.        Assessment/Plan: 1. Encounter for routine adult health examination with abnormal findings Age-appropriate preventive screenings and vaccinations discussed, annual physical exam completed. Routine labs for health maintenance ordered, see below. PHM updated.  - TSH + free T4 - Lipid Profile - Hepatic function panel - cetirizine (ZYRTEC) 10 MG tablet; TAKE 1 TABLET (10 MG TOTAL) BY MOUTH DAILY. FOR ALLERGIES (NOT COVERED)  Dispense: 90 tablet; Refill: 3 - furosemide (LASIX) 20 MG tablet; Take 1 tablet (20 mg total) by mouth daily.  Dispense: 90 tablet; Refill: 1  2. Statin myopathy Routine labs ordered. Stopped taking rosuvastatin due to muscle aches/pains. Will recheck lipid panel and then determine if she needs medication such as fenofibrate.  - Lipid Profile - Hepatic function panel  3. Hypothyroidism, unspecified type Routine lab ordered - TSH + free T4  4. Mixed hyperlipidemia Routine lab ordered - Lipid Profile  5. Dysuria Routine urinalysis done - UA/M w/rflx Culture, Routine      General Counseling:  Lessa verbalizes understanding of the findings of todays visit and agrees with plan of treatment. I have discussed any further diagnostic evaluation that may be needed or ordered today. We also reviewed her medications today. she has been encouraged to call the office with any questions or concerns that should arise related to todays visit.    Orders Placed This Encounter  Procedures   UA/M w/rflx Culture, Routine   TSH + free T4   Lipid Profile   Hepatic function panel    Meds ordered this encounter  Medications   cetirizine (ZYRTEC) 10 MG tablet    Sig: TAKE 1 TABLET (10 MG TOTAL) BY MOUTH DAILY. FOR ALLERGIES (NOT COVERED)    Dispense:  90 tablet    Refill:  3   furosemide (LASIX) 20 MG tablet    Sig: Take 1 tablet (20 mg total) by mouth daily.    Dispense:  90 tablet    Refill:  1    Return in about 6 months (around 03/07/2023) for F/U, Perline Awe PCP.   Total time spent:30 Minutes  Time spent includes review of chart, medications, test results, and follow up plan with the patient.   [place] Controlled Substance Database was reviewed by me.  This patient was seen by Jonetta Osgood, FNP-C in collaboration with Dr. Clayborn Bigness as a part of collaborative care agreement.  Cervando Durnin R. Valetta Fuller, MSN, FNP-C Internal medicine

## 2022-09-08 LAB — UA/M W/RFLX CULTURE, ROUTINE
Bilirubin, UA: NEGATIVE
Glucose, UA: NEGATIVE
Ketones, UA: NEGATIVE
Nitrite, UA: NEGATIVE
Protein,UA: NEGATIVE
RBC, UA: NEGATIVE
Specific Gravity, UA: 1.009 (ref 1.005–1.030)
Urobilinogen, Ur: 0.2 mg/dL (ref 0.2–1.0)
pH, UA: 7 (ref 5.0–7.5)

## 2022-09-08 LAB — MICROSCOPIC EXAMINATION
Bacteria, UA: NONE SEEN
Casts: NONE SEEN /lpf
RBC, Urine: NONE SEEN /hpf (ref 0–2)

## 2022-09-08 LAB — URINE CULTURE, REFLEX

## 2022-09-09 DIAGNOSIS — G720 Drug-induced myopathy: Secondary | ICD-10-CM | POA: Diagnosis not present

## 2022-09-09 DIAGNOSIS — E782 Mixed hyperlipidemia: Secondary | ICD-10-CM | POA: Diagnosis not present

## 2022-09-09 DIAGNOSIS — Z0001 Encounter for general adult medical examination with abnormal findings: Secondary | ICD-10-CM | POA: Diagnosis not present

## 2022-09-09 DIAGNOSIS — T466X5A Adverse effect of antihyperlipidemic and antiarteriosclerotic drugs, initial encounter: Secondary | ICD-10-CM | POA: Diagnosis not present

## 2022-09-09 DIAGNOSIS — E039 Hypothyroidism, unspecified: Secondary | ICD-10-CM | POA: Diagnosis not present

## 2022-09-10 LAB — TSH+FREE T4
Free T4: 1.45 ng/dL (ref 0.82–1.77)
TSH: 1.27 u[IU]/mL (ref 0.450–4.500)

## 2022-09-10 LAB — LIPID PANEL
Chol/HDL Ratio: 4.2 ratio (ref 0.0–4.4)
Cholesterol, Total: 229 mg/dL — ABNORMAL HIGH (ref 100–199)
HDL: 54 mg/dL (ref >39)
LDL Chol Calc (NIH): 148 mg/dL — ABNORMAL HIGH (ref 0–99)
Triglycerides: 153 mg/dL — ABNORMAL HIGH (ref 0–149)
VLDL Cholesterol Cal: 27 mg/dL (ref 5–40)

## 2022-09-10 LAB — HEPATIC FUNCTION PANEL
ALT: 15 IU/L (ref 0–32)
AST: 18 IU/L (ref 0–40)
Albumin: 4.4 g/dL (ref 3.8–4.8)
Alkaline Phosphatase: 86 IU/L (ref 44–121)
Bilirubin Total: 0.5 mg/dL (ref 0.0–1.2)
Bilirubin, Direct: 0.13 mg/dL (ref 0.00–0.40)
Total Protein: 6.7 g/dL (ref 6.0–8.5)

## 2022-09-13 ENCOUNTER — Encounter: Payer: Self-pay | Admitting: Internal Medicine

## 2022-09-13 NOTE — Telephone Encounter (Signed)
Spoke with pt please review her labs

## 2022-09-13 NOTE — Progress Notes (Signed)
1. Abnormal urinalysis -- showed leukocytes in her urine but not very many. Urine culture was done and was normal, only showing mixed urogenital flora. There are no further interventions needed for this.  2. Cholesterol panel -- elevated total cholesterol, triglycerides and LDL-- make sure to eat heart healthy diet, low fat and low cholesterol. Limit red meat, eat leaner proteins such as chicken, [place] or fish. Another recommendation is adding a fish oil or flaxseed oil supplement. The next step after trying all of these interventions would be prescription medication.   Please call patient and let her know this.

## 2022-09-23 DIAGNOSIS — H25813 Combined forms of age-related cataract, bilateral: Secondary | ICD-10-CM | POA: Diagnosis not present

## 2022-11-15 DIAGNOSIS — I872 Venous insufficiency (chronic) (peripheral): Secondary | ICD-10-CM | POA: Diagnosis not present

## 2022-12-03 DIAGNOSIS — I872 Venous insufficiency (chronic) (peripheral): Secondary | ICD-10-CM | POA: Diagnosis not present

## 2022-12-03 DIAGNOSIS — I83892 Varicose veins of left lower extremities with other complications: Secondary | ICD-10-CM | POA: Diagnosis not present

## 2022-12-06 DIAGNOSIS — I872 Venous insufficiency (chronic) (peripheral): Secondary | ICD-10-CM | POA: Diagnosis not present

## 2023-01-27 ENCOUNTER — Encounter: Payer: PPO | Admitting: Dermatology

## 2023-01-29 ENCOUNTER — Telehealth: Payer: Self-pay | Admitting: Nurse Practitioner

## 2023-01-29 ENCOUNTER — Other Ambulatory Visit: Payer: Self-pay | Admitting: Nurse Practitioner

## 2023-01-29 MED ORDER — CIPROFLOXACIN HCL 500 MG PO TABS: 500.0000 mg | ORAL_TABLET | Freq: Two times a day (BID) | ORAL | 0 refills | Status: AC

## 2023-01-29 NOTE — Telephone Encounter (Signed)
Patient lvm on after-hour line requesting rx be sent to pharmacy for uti. Cipro sent to [address] [facility]. Patient to call office next week if no relief-Toni

## 2023-02-07 ENCOUNTER — Encounter: Payer: Self-pay | Admitting: Physician Assistant

## 2023-02-07 ENCOUNTER — Ambulatory Visit (INDEPENDENT_AMBULATORY_CARE_PROVIDER_SITE_OTHER): Payer: PPO | Admitting: Physician Assistant

## 2023-02-07 VITALS — BP 138/86 | HR 65 | Temp 98.1°F | Resp 16 | Ht 63.0 in | Wt 163.2 lb

## 2023-02-07 DIAGNOSIS — N390 Urinary tract infection, site not specified: Secondary | ICD-10-CM

## 2023-02-07 LAB — POCT URINALYSIS DIPSTICK
Bilirubin, UA: NEGATIVE
Glucose, UA: NEGATIVE
Leukocytes, UA: NEGATIVE
Nitrite, UA: NEGATIVE
Protein, UA: NEGATIVE
Spec Grav, UA: 1.01 (ref 1.010–1.025)
Urobilinogen, UA: 0.2 E.U./dL
pH, UA: 6 (ref 5.0–8.0)

## 2023-02-07 MED ORDER — FLUCONAZOLE 150 MG PO TABS: 150.0000 mg | ORAL_TABLET | Freq: Once | ORAL | 0 refills | Status: AC

## 2023-02-07 MED ORDER — NITROFURANTOIN MONOHYD MACRO 100 MG PO CAPS: ORAL_CAPSULE | ORAL | 0 refills | Status: DC

## 2023-02-07 NOTE — Progress Notes (Signed)
[facility], [place] [address] [place], [place] [address]  Internal MEDICINE  Office Visit Note  Patient Name: Marissa Wilson  [id number]  [id number]  Date of Service: 02/16/2023  Chief Complaint  Patient presents with   Acute Visit    uti     HPI Pt is here for a sick visit. -Symptoms started 2 weeks ago and was called in cipro for 5days. Things got better but then got worse again -Pelvic pressure and burning. A little urgency and frequency.  -a little vaginal irritation  Current Medication:  Outpatient Encounter Medications as of 02/07/2023  Medication Sig   acetaminophen (TYLENOL) 650 MG CR tablet Take 650 mg every 6 (six) hours by mouth.   azelastine (ASTELIN) 0.1 % nasal spray Place 2 sprays into both nostrils 2 (two) times daily. Use in each nostril as directed   cetirizine (ZYRTEC) 10 MG tablet TAKE 1 TABLET (10 MG TOTAL) BY MOUTH DAILY. FOR ALLERGIES (NOT COVERED)   [EXPIRED] fluconazole (DIFLUCAN) 150 MG tablet Take 1 tablet (150 mg total) by mouth once for 1 dose.   furosemide (LASIX) 20 MG tablet Take 1 tablet (20 mg total) by mouth daily.   levothyroxine (SYNTHROID) 75 MCG tablet TAKE 1 TABLET BY MOUTH EVERY DAY   montelukast (SINGULAIR) 10 MG tablet TAKE 1 TABLET BY MOUTH EVERYDAY AT BEDTIME   Multiple Vitamins-Minerals (VITAMIN-MINERAL SUPPLEMENT PO) Take by mouth daily. Mega D-3 & MK-7 5000 IU/180MCG   omeprazole (PRILOSEC) 40 MG capsule TAKE 1 CAPSULE (40 MG TOTAL) BY MOUTH DAILY.   rivaroxaban (XARELTO) 10 MG TABS tablet Take 10 mg by mouth at bedtime.   [DISCONTINUED] azithromycin (ZITHROMAX) 250 MG tablet Take one tab a day for 10 days for uri   [DISCONTINUED] nitrofurantoin, macrocrystal-monohydrate, (MACROBID) 100 MG capsule Take 1 cap twice per day for 10 days.   No facility-administered encounter medications on file as of 02/07/2023.      Medical History: Past Medical History:  Diagnosis Date   Asthma    Clotting disorder ([facility])     Gastrointestinal disorder    GERD (gastroesophageal reflux disease)    Hx of melanoma excision    Hyperlipidemia, unspecified    Hypertension    Hypothyroid    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   Osteoarthritis    Sleep apnea    Thyroid disease    UTI (urinary tract infection)      Vital Signs: BP 138/86   Pulse 65   Temp 98.1 F (36.7 C)   Resp 16   Ht 5\' 3"  (1.6 m)   Wt 163 lb 3.2 oz (74 kg)   SpO2 95%   BMI 28.91 kg/m    Review of Systems  Constitutional:  Negative for fatigue and fever.  HENT:  Negative for congestion, mouth sores and postnasal drip.   Respiratory:  Negative for cough.   Cardiovascular:  Negative for chest pain.  Genitourinary:  Positive for dysuria, frequency, pelvic pain, urgency and vaginal pain.  Psychiatric/Behavioral: Negative.      Physical Exam Vitals and nursing note reviewed.  Constitutional:      Appearance: Normal appearance.  HENT:     Head: Normocephalic and atraumatic.     Nose: Nose normal.     Mouth/Throat:     Mouth: Mucous membranes are moist.     Pharynx: No posterior oropharyngeal erythema.  Eyes:     Extraocular Movements: Extraocular movements intact.  Pupils: Pupils are equal, round, and reactive to light.  Cardiovascular:     Rate and Rhythm: Normal rate and regular rhythm.     Pulses: Normal pulses.     Heart sounds: Normal heart sounds.  Pulmonary:     Effort: Pulmonary effort is normal.     Breath sounds: Normal breath sounds.  Skin:    General: Skin is warm and dry.  Neurological:     General: No focal deficit present.     Mental Status: She is alert.  Psychiatric:        Mood and Affect: Mood normal.        Behavior: Behavior normal.       Assessment/Plan: 1. Urinary tract infection without hematuria, site unspecified Will go ahead and treat with macrobid given incomplete response to cipro while awaiting C/S and adjust based on results - POCT  urinalysis dipstick - CULTURE, URINE COMPREHENSIVE   General Counseling: Raul verbalizes understanding of the findings of todays visit and agrees with plan of treatment. I have discussed any further diagnostic evaluation that may be needed or ordered today. We also reviewed her medications today. she has been encouraged to call the office with any questions or concerns that should arise related to todays visit.    Counseling:    Orders Placed This Encounter  Procedures   CULTURE, URINE COMPREHENSIVE   POCT urinalysis dipstick    Meds ordered this encounter  Medications   DISCONTD: nitrofurantoin, macrocrystal-monohydrate, (MACROBID) 100 MG capsule    Sig: Take 1 cap twice per day for 10 days.    Dispense:  20 capsule    Refill:  0   fluconazole (DIFLUCAN) 150 MG tablet    Sig: Take 1 tablet (150 mg total) by mouth once for 1 dose.    Dispense:  1 tablet    Refill:  0    Time spent:25 Minutes

## 2023-02-08 ENCOUNTER — Other Ambulatory Visit: Payer: Self-pay

## 2023-02-08 ENCOUNTER — Telehealth: Payer: Self-pay

## 2023-02-08 MED ORDER — CIPROFLOXACIN HCL 500 MG PO TABS: 500.0000 mg | ORAL_TABLET | Freq: Two times a day (BID) | ORAL | 0 refills | Status: DC

## 2023-02-08 NOTE — Telephone Encounter (Signed)
As per dr Welton Flakes advised that we add macrobid on her allergy list and also send cipro to [facility] we still waiting for culture

## 2023-02-11 LAB — CULTURE, URINE COMPREHENSIVE

## 2023-02-14 ENCOUNTER — Telehealth: Payer: Self-pay

## 2023-02-14 ENCOUNTER — Other Ambulatory Visit: Payer: Self-pay | Admitting: Physician Assistant

## 2023-02-14 DIAGNOSIS — N390 Urinary tract infection, site not specified: Secondary | ICD-10-CM

## 2023-02-14 MED ORDER — DOXYCYCLINE HYCLATE 100 MG PO TABS: 100.0000 mg | ORAL_TABLET | Freq: Two times a day (BID) | ORAL | 0 refills | Status: DC

## 2023-02-14 NOTE — Telephone Encounter (Signed)
-----   Message from Carlean Jews, PA-C sent at 02/14/2023  1:15 PM EDT ----- Please send Doxy 100mg  BID x 7days since culture shows resistance to Cipro. Advise to avoid sun exposure on this medication

## 2023-02-14 NOTE — Telephone Encounter (Signed)
Pt was notified.  

## 2023-02-18 ENCOUNTER — Other Ambulatory Visit: Payer: Self-pay | Admitting: Physician Assistant

## 2023-02-19 ENCOUNTER — Other Ambulatory Visit: Payer: Self-pay | Admitting: Physician Assistant

## 2023-02-19 ENCOUNTER — Other Ambulatory Visit: Payer: Self-pay | Admitting: Internal Medicine

## 2023-02-19 DIAGNOSIS — J3089 Other allergic rhinitis: Secondary | ICD-10-CM

## 2023-02-21 ENCOUNTER — Telehealth: Payer: Self-pay

## 2023-02-21 ENCOUNTER — Other Ambulatory Visit: Payer: Self-pay

## 2023-02-21 NOTE — Telephone Encounter (Addendum)
Spoke with pt as per lauren urine culture is not so bad she can drink plenty of water and drink cranberry  juice and finished antibiotic  and if not feeling better call us back

## 2023-03-14 ENCOUNTER — Ambulatory Visit: Payer: PPO | Admitting: Internal Medicine

## 2023-03-14 ENCOUNTER — Ambulatory Visit: Payer: PPO | Admitting: Nurse Practitioner

## 2023-03-21 ENCOUNTER — Ambulatory Visit: Payer: PPO | Admitting: Dermatology

## 2023-04-05 ENCOUNTER — Encounter: Payer: Self-pay | Admitting: Internal Medicine

## 2023-04-05 ENCOUNTER — Ambulatory Visit (INDEPENDENT_AMBULATORY_CARE_PROVIDER_SITE_OTHER): Payer: PPO | Admitting: Internal Medicine

## 2023-04-05 VITALS — BP 128/76 | HR 70 | Temp 98.2°F | Resp 16 | Ht 63.0 in | Wt 162.8 lb

## 2023-04-05 DIAGNOSIS — E782 Mixed hyperlipidemia: Secondary | ICD-10-CM

## 2023-04-05 DIAGNOSIS — N952 Postmenopausal atrophic vaginitis: Secondary | ICD-10-CM

## 2023-04-05 DIAGNOSIS — E039 Hypothyroidism, unspecified: Secondary | ICD-10-CM | POA: Diagnosis not present

## 2023-04-05 DIAGNOSIS — Z1231 Encounter for screening mammogram for malignant neoplasm of breast: Secondary | ICD-10-CM

## 2023-04-05 DIAGNOSIS — R300 Dysuria: Secondary | ICD-10-CM

## 2023-04-05 MED ORDER — ROSUVASTATIN CALCIUM 5 MG PO TABS: ORAL_TABLET | ORAL | 1 refills | Status: DC

## 2023-04-05 MED ORDER — ESTRADIOL 10 MCG VA TABS: ORAL_TABLET | VAGINAL | 2 refills | Status: DC

## 2023-04-05 MED ORDER — TETANUS-DIPHTH-ACELL PERTUSSIS 5-2.5-18.5 LF-MCG/0.5 IM SUSP: 0.5000 mL | Freq: Once | INTRAMUSCULAR | 0 refills | Status: AC

## 2023-04-05 NOTE — Progress Notes (Signed)
[facility] [address] [place], [place] [address]  Internal MEDICINE  Office Visit Note  Patient Name: Marissa Wilson  [id number]  [id number]  Date of Service: 04/19/2023  Chief Complaint  Patient presents with   Gastroesophageal Reflux   Hypertension   Hyperlipidemia   Follow-up    HPI Patient is here for routine follow-up 1.  Complains of vaginal itching and burning does develop UTI however denies any dysuria at this time 2.  Patient has elevated lipid profile but has been very reluctant to want to take medication due to elevation in her transaminases 3.  Patient is up-to-date on her mammogram and colonoscopy will need to have some vaccination updated 4.  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   cetirizine (ZYRTEC) 10 MG tablet TAKE 1 TABLET (10 MG TOTAL) BY MOUTH DAILY. FOR ALLERGIES (NOT COVERED)   doxycycline (VIBRA-TABS) 100 MG tablet Take 1 tablet (100 mg total) by mouth 2 (two) times daily.   levothyroxine (SYNTHROID) 75 MCG tablet TAKE 1 TABLET BY MOUTH EVERY DAY   montelukast (SINGULAIR) 10 MG tablet TAKE 1 TABLET BY MOUTH EVERYDAY AT BEDTIME   Multiple Vitamins-Minerals (VITAMIN-MINERAL SUPPLEMENT PO) Take by mouth daily. Mega D-3 & MK-7 5000 IU/180MCG   omeprazole (PRILOSEC) 40 MG capsule TAKE 1 CAPSULE (40 MG TOTAL) BY MOUTH DAILY.   rivaroxaban (XARELTO) 10 MG TABS tablet Take 10 mg by mouth at bedtime.   rosuvastatin (CRESTOR) 5 MG tablet Take one tab twice a week   [DISCONTINUED] Estradiol 10 MCG TABS vaginal tablet One intravaginal insertion once or 2 weeks   [DISCONTINUED] furosemide (LASIX) 20 MG tablet Take 1 tablet (20 mg total) by mouth daily.   [DISCONTINUED] Tdap (BOOSTRIX)  5-2.5-18.5 LF-MCG/0.5 injection Inject 0.5 mLs into the muscle once.   azelastine (ASTELIN) 0.1 % nasal spray Place 2 sprays into both nostrils 2 (two) times daily. Use in each nostril as directed   [EXPIRED] Tdap (BOOSTRIX) 5-2.5-18.5 LF-MCG/0.5 injection Inject 0.5 mLs into the muscle once for 1 dose.   No facility-administered encounter medications on file as of 04/05/2023.    Surgical History: Past Surgical History:  Procedure Laterality Date   ABDOMINAL HYSTERECTOMY     ABLATION SAPHENOUS VEIN W/ RFA Right 05/21/2021   COLONOSCOPY WITH PROPOFOL N/A 06/20/2020   Procedure: COLONOSCOPY WITH PROPOFOL;  Surgeon: Earline Mayotte, MD;  Location: [facility] ENDOSCOPY;  Service: Endoscopy;  Laterality: N/A;   ESOPHAGOGASTRODUODENOSCOPY (EGD) WITH PROPOFOL N/A 06/20/2020   Procedure: ESOPHAGOGASTRODUODENOSCOPY (EGD) WITH PROPOFOL;  Surgeon: Earline Mayotte, MD;  Location: [facility] ENDOSCOPY;  Service: Endoscopy;  Laterality: N/A;  COVID POSITIVE 05/19/20   PR ENDOVENOUS LASTER, 1ST VEIN Right 06/09/2017   Procedure: EVLA of Right GSV; Surgeon: Derry Skill, MD; Location: [facility]; Service: Vascular   PR LIGATN LONG SAPHENOUS VEIN AT SEPH-FEM JUNC Right 06/09/2017   Procedure: LIGATION & DIVISION OF LONG SAPHENOUS VEIN AT SAPHENOFEMORAL JUNCTION, OR DISTAL INTERRUPTIONS; Surgeon: Derry Skill, MD; Location: MAIN OR [facility]; Service: Vascular   PR PHLEB VEINS EXTREM TO 20 Right 06/09/2017   Procedure: STAB PHLEBECTOMY OF VARICOSE VEINS, 1 EXTREMITY: 10-20 STAB INCISIONS; Surgeon: Derry Skill, MD; Location: MAIN OR [facility]; Service: Vascular    Medical  History: Past Medical History:  Diagnosis Date   Asthma    Clotting disorder (HCC)    Gastrointestinal disorder    GERD (gastroesophageal reflux disease)    Hx of melanoma excision    Hyperlipidemia, unspecified    Hypertension    Hypothyroid    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   Osteoarthritis    Sleep apnea    Thyroid disease    UTI (urinary tract infection)     Family History: Family History  Problem Relation Age of Onset   Breast cancer Mother 85   Varicose Veins Mother    Lung cancer Father    Heart attack Father    Bladder Cancer Neg Hx    Kidney cancer Neg Hx     Social History   Socioeconomic History   Marital status: Married    Spouse name: Rudell Cobb   Number of children: 2   Years of education: 13   Highest education level: Some college, no degree  Occupational History   Not on file  Tobacco Use   Smoking status: Never   Smokeless tobacco: Never  Vaping Use   Vaping status: Never Used  Substance and Sexual Activity   Alcohol use: Yes    Comment: occasional wine   Drug use: No   Sexual activity: Not on file  Other Topics Concern   Not on file  Social History Narrative   Not on file   Social Determinants of Health   Financial Resource Strain: Low Risk  (05/22/2021)   Received from [facility], [facility]   Overall Financial Resource Strain (CARDIA)    Difficulty of Paying Living Expenses: Not very hard  Food Insecurity: No Food Insecurity (05/22/2021)   Received from [facility], [facility]   Hunger Vital Sign    Worried About Running Out of Food in the Last Year: Never true    Ran Out of Food in the Last Year: Never true  Transportation Needs: No Transportation Needs (05/22/2021)   Received from [facility], [facility]   [facility] - Transportation    Lack of Transportation (Medical): No    Lack of Transportation (Non-Medical): No  Physical Activity: Not on file  Stress: Not on file  Social Connections: Not on file  Intimate Partner Violence: Not on file      Review of Systems  Constitutional:  Negative for chills, fatigue and unexpected weight change.  HENT:  Positive for postnasal drip. Negative for congestion, rhinorrhea, sneezing and sore throat.   Eyes:  Negative for redness.   Respiratory:  Negative for cough, chest tightness and shortness of breath.   Cardiovascular:  Negative for chest pain and palpitations.  Gastrointestinal:  Negative for abdominal pain, constipation, diarrhea, nausea and vomiting.  Genitourinary:  Negative for dysuria and frequency.  Musculoskeletal:  Negative for arthralgias, back pain, joint swelling and neck pain.  Skin:  Negative for rash.  Neurological: Negative.  Negative for tremors and numbness.  Hematological:  Negative for adenopathy. Does not bruise/bleed easily.  Psychiatric/Behavioral:  Negative for behavioral problems (Depression), sleep disturbance and suicidal ideas. The patient is not nervous/anxious.     Vital Signs: BP 128/76   Pulse 70   Temp 98.2 F (36.8 C)   Resp 16   Ht 5\' 3"  (1.6 m)   Wt 162 lb 12.8 oz (73.8 kg)   SpO2 98%   BMI 28.84 kg/m  Physical Exam Constitutional:      Appearance: Normal appearance.  HENT:     Head: Normocephalic and atraumatic.     Nose: Nose normal.     Mouth/Throat:     Mouth: Mucous membranes are moist.     Pharynx: No posterior oropharyngeal erythema.  Eyes:     Extraocular Movements: Extraocular movements intact.     Pupils: Pupils are equal, round, and reactive to light.  Cardiovascular:     Pulses: Normal pulses.     Heart sounds: Normal heart sounds.  Pulmonary:     Effort: Pulmonary effort is normal.     Breath sounds: Normal breath sounds.  Neurological:     General: No focal deficit present.     Mental Status: She is alert.  Psychiatric:        Mood and Affect: Mood normal.        Behavior: Behavior normal.        Assessment/Plan: 1. Atrophic vaginitis History of repeated UTI or vaginal itching we will add intravaginal steroids as needed to see if her symptoms resolve  2. Breast cancer screening by mammogram Update her mammogram order recent - MM 3D SCREENING MAMMOGRAM BILATERAL BREAST; Future  3. Mixed hyperlipidemia Patient is willing to  try low-dose of Crestor twice a week we will repeat lipid profile in 6 months - rosuvastatin (CRESTOR) 5 MG tablet; Take one tab twice a week  Dispense: 24 tablet; Refill: 1  4. Dysuria Get urine cultures - UA/M w/rflx Culture, Routine - Microscopic Examination  5. Hypothyroidism, unspecified type Continue Synthroid as before  General Counseling: Maty verbalizes understanding of the findings of todays visit and agrees with plan of treatment. I have discussed any further diagnostic evaluation that may be needed or ordered today. We also reviewed her medications today. she has been encouraged to call the office with any questions or concerns that should arise related to todays visit.    Orders Placed This Encounter  Procedures   Microscopic Examination   MM 3D SCREENING MAMMOGRAM BILATERAL BREAST   UA/M w/rflx Culture, Routine    Meds ordered this encounter  Medications   Tdap (BOOSTRIX) 5-2.5-18.5 LF-MCG/0.5 injection    Sig: Inject 0.5 mLs into the muscle once for 1 dose.    Dispense:  0.5 mL    Refill:  0   rosuvastatin (CRESTOR) 5 MG tablet    Sig: Take one tab twice a week    Dispense:  24 tablet    Refill:  1   DISCONTD: Estradiol 10 MCG TABS vaginal tablet    Sig: One intravaginal insertion once or 2 weeks    Dispense:  24 tablet    Refill:  2    Total time spent:35 Minutes Time spent includes review of chart, medications, test results, and follow up plan with the patient.   [place] Controlled Substance Database was reviewed by me.   Dr Lyndon Code Internal medicine

## 2023-04-06 ENCOUNTER — Ambulatory Visit: Payer: PPO | Admitting: Dermatology

## 2023-04-06 ENCOUNTER — Encounter: Payer: Self-pay | Admitting: Dermatology

## 2023-04-06 ENCOUNTER — Telehealth: Payer: Self-pay

## 2023-04-06 DIAGNOSIS — L820 Inflamed seborrheic keratosis: Secondary | ICD-10-CM | POA: Diagnosis not present

## 2023-04-06 DIAGNOSIS — D229 Melanocytic nevi, unspecified: Secondary | ICD-10-CM

## 2023-04-06 DIAGNOSIS — I8393 Asymptomatic varicose veins of bilateral lower extremities: Secondary | ICD-10-CM

## 2023-04-06 DIAGNOSIS — Z1283 Encounter for screening for malignant neoplasm of skin: Secondary | ICD-10-CM

## 2023-04-06 DIAGNOSIS — L814 Other melanin hyperpigmentation: Secondary | ICD-10-CM

## 2023-04-06 DIAGNOSIS — B353 Tinea pedis: Secondary | ICD-10-CM | POA: Diagnosis not present

## 2023-04-06 DIAGNOSIS — D1801 Hemangioma of skin and subcutaneous tissue: Secondary | ICD-10-CM | POA: Diagnosis not present

## 2023-04-06 DIAGNOSIS — Z79899 Other long term (current) drug therapy: Secondary | ICD-10-CM

## 2023-04-06 DIAGNOSIS — L821 Other seborrheic keratosis: Secondary | ICD-10-CM

## 2023-04-06 DIAGNOSIS — L738 Other specified follicular disorders: Secondary | ICD-10-CM

## 2023-04-06 DIAGNOSIS — Z7189 Other specified counseling: Secondary | ICD-10-CM

## 2023-04-06 DIAGNOSIS — I781 Nevus, non-neoplastic: Secondary | ICD-10-CM

## 2023-04-06 DIAGNOSIS — W908XXA Exposure to other nonionizing radiation, initial encounter: Secondary | ICD-10-CM | POA: Diagnosis not present

## 2023-04-06 DIAGNOSIS — L578 Other skin changes due to chronic exposure to nonionizing radiation: Secondary | ICD-10-CM | POA: Diagnosis not present

## 2023-04-06 DIAGNOSIS — Z85820 Personal history of malignant melanoma of skin: Secondary | ICD-10-CM

## 2023-04-06 LAB — UA/M W/RFLX CULTURE, ROUTINE
Bilirubin, UA: NEGATIVE
Glucose, UA: NEGATIVE
Ketones, UA: NEGATIVE
Leukocytes,UA: NEGATIVE
Nitrite, UA: NEGATIVE
Protein,UA: NEGATIVE
RBC, UA: NEGATIVE
Specific Gravity, UA: 1.007 (ref 1.005–1.030)
Urobilinogen, Ur: 0.2 mg/dL (ref 0.2–1.0)
pH, UA: 6.5 (ref 5.0–7.5)

## 2023-04-06 LAB — MICROSCOPIC EXAMINATION
Bacteria, UA: NONE SEEN
Casts: NONE SEEN /LPF
RBC, Urine: NONE SEEN /HPF (ref 0–2)
WBC, UA: NONE SEEN /HPF (ref 0–5)

## 2023-04-06 MED ORDER — KETOCONAZOLE 2 % EX CREA: 1.0000 | TOPICAL_CREAM | Freq: Two times a day (BID) | CUTANEOUS | 5 refills | Status: AC

## 2023-04-06 NOTE — Progress Notes (Signed)
Follow-Up Visit   Subjective  Marissa Wilson is a 77 y.o. female who presents for the following: Skin Cancer Screening and Full Body Skin Exam  The patient presents for Total-Body Skin Exam (TBSE) for skin cancer screening and mole check. The patient has spots, moles and lesions to be evaluated, some may be new or changing and the patient may have concern these could be cancer.  Patient with hx of MM at left cheek, 2019.  The following portions of the chart were reviewed this encounter and updated as appropriate: medications, allergies, medical history  Review of Systems:  No other skin or systemic complaints except as noted in HPI or Assessment and Plan.  Objective  Well appearing patient in no apparent distress; mood and affect are within normal limits.  A full examination was performed including scalp, head, eyes, ears, nose, lips, neck, chest, axillae, abdomen, back, buttocks, bilateral upper extremities, bilateral lower extremities, hands, feet, fingers, toes, fingernails, and toenails. All findings within normal limits unless otherwise noted below.   Exam of face limited by presence of make up.   Relevant physical exam findings are noted in the Assessment and Plan.  Left Axilla x 1, L ant lateral thigh x 1 (2) Erythematous stuck-on, waxy papule or plaque    Assessment & Plan   SKIN CANCER SCREENING PERFORMED TODAY.  ACTINIC DAMAGE - Chronic condition, secondary to cumulative UV/sun exposure - diffuse scaly erythematous macules with underlying dyspigmentation - Recommend daily broad spectrum sunscreen SPF 30+ to sun-exposed areas, reapply every 2 hours as needed.  - Staying in the shade or wearing long sleeves, sun glasses (UVA+UVB protection) and wide brim hats (4-inch brim around the entire circumference of the hat) are also recommended for sun protection.  - Call for new or changing lesions.  LENTIGINES, SEBORRHEIC KERATOSES, HEMANGIOMAS - Benign normal skin  lesions - Benign-appearing - Call for any changes  MELANOCYTIC NEVI - Tan-brown and/or pink-flesh-colored symmetric macules and papules - Benign appearing on exam today - Observation - Call clinic for new or changing moles - Recommend daily use of broad spectrum spf 30+ sunscreen to sun-exposed areas.   HISTORY OF MELANOMA - Left cheek, lentigo maligna type, 2019 - No evidence of recurrence today - No lymphadenopathy - Recommend regular full body skin exams - Recommend daily broad spectrum sunscreen SPF 30+ to sun-exposed areas, reapply every 2 hours as needed.  - Call if any new or changing lesions are noted between office visits  Sebaceous Hyperplasia - Small yellow papules with a central dell - Benign-appearing - Observe. Call for changes.  Varicose Veins/Spider Veins - Dilated blue, purple or red veins at the lower extremities - Reassured - Smaller vessels can be treated by sclerotherapy (a procedure to inject a medicine into the veins to make them disappear) if desired, but the treatment is not covered by insurance. Larger vessels may be covered if symptomatic and we would refer to vascular surgeon if treatment desired.   Inflamed seborrheic keratosis (2) Left Axilla x 1, L ant lateral thigh x 1  Symptomatic, irritating, patient would like treated.  Benign-appearing.  Call clinic for new or changing lesions.   Right underside mandible to be treated at a later time.    Destruction of lesion - Left Axilla x 1, L ant lateral thigh x 1 (2)  Destruction method: cryotherapy   Informed consent: discussed and consent obtained   Lesion destroyed using liquid nitrogen: Yes   Cryotherapy cycles:  2 Outcome: patient  tolerated procedure well with no complications   Post-procedure details: wound care instructions given     TINEA PEDIS Exam: Scaling and maceration web spaces and over distal and lateral soles. Chronic and persistent condition with duration or expected duration  over one year. Condition is symptomatic / bothersome to patient. Not to goal.  Treatment Plan: Apply ketoconazole cream once to twice a day to feet and in between toes as needed.   Return in about 1 year (around 04/05/2024) for TBSE, Hx MM, ISK early November.  Anise Salvo, RMA, am acting as scribe for Armida Sans, MD .   Documentation: I have reviewed the above documentation for accuracy and completeness, and I agree with the above.  Armida Sans, MD

## 2023-04-06 NOTE — Patient Instructions (Addendum)

## 2023-04-07 ENCOUNTER — Other Ambulatory Visit: Payer: Self-pay

## 2023-04-07 DIAGNOSIS — N952 Postmenopausal atrophic vaginitis: Secondary | ICD-10-CM

## 2023-04-07 MED ORDER — ESTRADIOL 10 MCG VA TABS: ORAL_TABLET | VAGINAL | 2 refills | Status: DC

## 2023-04-07 NOTE — Telephone Encounter (Signed)
As per dr Welton Flakes send vagifem

## 2023-04-12 ENCOUNTER — Encounter: Payer: PPO | Admitting: Dermatology

## 2023-04-14 ENCOUNTER — Other Ambulatory Visit: Payer: Self-pay | Admitting: Nurse Practitioner

## 2023-04-14 DIAGNOSIS — Z0001 Encounter for general adult medical examination with abnormal findings: Secondary | ICD-10-CM

## 2023-05-02 ENCOUNTER — Encounter: Payer: Self-pay | Admitting: Internal Medicine

## 2023-06-02 ENCOUNTER — Other Ambulatory Visit: Payer: Self-pay | Admitting: Surgery

## 2023-06-02 DIAGNOSIS — K862 Cyst of pancreas: Secondary | ICD-10-CM

## 2023-06-06 VITALS — BP 140/82 | HR 67 | Temp 97.8°F | Resp 16 | Ht 63.0 in | Wt 164.0 lb

## 2023-06-06 DIAGNOSIS — I872 Venous insufficiency (chronic) (peripheral): Secondary | ICD-10-CM | POA: Diagnosis not present

## 2023-06-07 ENCOUNTER — Other Ambulatory Visit: Payer: Self-pay | Admitting: Surgery

## 2023-06-07 ENCOUNTER — Ambulatory Visit (INDEPENDENT_AMBULATORY_CARE_PROVIDER_SITE_OTHER): Payer: PPO | Admitting: Nurse Practitioner

## 2023-06-07 ENCOUNTER — Ambulatory Visit
Admission: RE | Admit: 2023-06-07 | Discharge: 2023-06-07 | Disposition: A | Discharge status: Home / Self Care | Payer: PPO | Source: Ambulatory Visit | Attending: Surgery | Admitting: Surgery

## 2023-06-07 ENCOUNTER — Encounter: Payer: Self-pay | Admitting: Nurse Practitioner

## 2023-06-07 VITALS — BP 124/72 | HR 86 | Temp 98.3°F | Resp 16 | Ht 63.0 in | Wt 162.0 lb

## 2023-06-07 DIAGNOSIS — R319 Hematuria, unspecified: Secondary | ICD-10-CM | POA: Diagnosis not present

## 2023-06-07 DIAGNOSIS — N390 Urinary tract infection, site not specified: Secondary | ICD-10-CM

## 2023-06-07 DIAGNOSIS — K449 Diaphragmatic hernia without obstruction or gangrene: Secondary | ICD-10-CM | POA: Diagnosis not present

## 2023-06-07 DIAGNOSIS — K862 Cyst of pancreas: Secondary | ICD-10-CM | POA: Diagnosis not present

## 2023-06-07 DIAGNOSIS — Z8619 Personal history of other infectious and parasitic diseases: Secondary | ICD-10-CM | POA: Diagnosis not present

## 2023-06-07 LAB — POCT URINALYSIS DIPSTICK
Bilirubin, UA: NEGATIVE
Glucose, UA: NEGATIVE
Leukocytes, UA: NEGATIVE
Nitrite, UA: NEGATIVE
Protein, UA: NEGATIVE
Spec Grav, UA: 1.01 (ref 1.010–1.025)
Urobilinogen, UA: 0.2 U/dL
pH, UA: 7 (ref 5.0–8.0)

## 2023-06-07 MED ORDER — GADOBUTROL 1 MMOL/ML IV SOLN
7.0000 mL | Freq: Once | INTRAVENOUS | Status: AC | PRN
  Administered 2023-06-07: 7 mL via INTRAVENOUS

## 2023-06-07 MED ORDER — DOXYCYCLINE HYCLATE 100 MG PO TABS: 100.0000 mg | ORAL_TABLET | Freq: Two times a day (BID) | ORAL | 0 refills | Status: AC

## 2023-06-07 MED ORDER — FLUCONAZOLE 150 MG PO TABS: 150.0000 mg | ORAL_TABLET | Freq: Once | ORAL | 0 refills | Status: AC

## 2023-06-07 NOTE — Progress Notes (Deleted)
cancelled

## 2023-06-07 NOTE — Progress Notes (Deleted)
Error

## 2023-06-07 NOTE — Progress Notes (Signed)
[facility] [address] [place], [place] [address]  Internal MEDICINE  Office Visit Note  Patient Name: Marissa Wilson  [id number]  [id number]  Date of Service: 06/07/2023  Chief Complaint  Patient presents with   Acute Visit    uti     HPI Marissa Wilson presents for an acute sick visit for symptoms of UTI --onset: of symptoms was last saturday.  Back pain, frequency, urgency, pelvic/suprapubic tenderness, bloating, burning with urination,  Urinalysis positive for trace blood Negative for leukocytes and nitrites.  This is the 3rd or 4th UTI this year. Discussed urology consult which she is agreeable to    Current Medication:  Outpatient Encounter Medications as of 06/07/2023  Medication Sig   acetaminophen (TYLENOL) 650 MG CR tablet Take 650 mg every 6 (six) hours by mouth.   cetirizine (ZYRTEC) 10 MG tablet TAKE 1 TABLET (10 MG TOTAL) BY MOUTH DAILY. FOR ALLERGIES (NOT COVERED)   doxycycline (VIBRA-TABS) 100 MG tablet Take 1 tablet (100 mg total) by mouth 2 (two) times daily for 7 days. Take with food   Estradiol (VAGIFEM) 10 MCG TABS vaginal tablet one intravaginal insertion once or 2 weeks   fluconazole (DIFLUCAN) 150 MG tablet Take 1 tablet (150 mg total) by mouth once for 1 dose. May take an additional dose after 3 days if still symptomatic.   furosemide (LASIX) 20 MG tablet TAKE 1 TABLET BY MOUTH EVERY DAY   levothyroxine (SYNTHROID) 75 MCG tablet TAKE 1 TABLET BY MOUTH EVERY DAY   montelukast (SINGULAIR) 10 MG tablet TAKE 1 TABLET BY MOUTH EVERYDAY AT BEDTIME   Multiple Vitamins-Minerals (VITAMIN-MINERAL SUPPLEMENT PO) Take by mouth daily. Mega D-3 & MK-7 5000 IU/180MCG   omeprazole (PRILOSEC) 40 MG capsule TAKE 1 CAPSULE (40 MG TOTAL) BY MOUTH DAILY.   rivaroxaban (XARELTO) 10 MG TABS tablet Take 10 mg by mouth at bedtime.   rosuvastatin (CRESTOR) 5 MG tablet Take one tab twice a week   [DISCONTINUED] doxycycline (VIBRA-TABS) 100 MG tablet Take 1 tablet  (100 mg total) by mouth 2 (two) times daily.   azelastine (ASTELIN) 0.1 % nasal spray Place 2 sprays into both nostrils 2 (two) times daily. Use in each nostril as directed   No facility-administered encounter medications on file as of 06/07/2023.      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   Osteoarthritis    Sleep apnea    Thyroid disease    UTI (urinary tract infection)      Vital Signs: BP 124/72   Pulse 86   Temp 98.3 F (36.8 C)   Resp 16   Ht 5\' 3"  (1.6 m)   Wt 162 lb (73.5 kg)   SpO2 97%   BMI 28.70 kg/m    Review of Systems  Constitutional:  Negative for fatigue and fever.  HENT:  Negative for congestion, mouth sores and postnasal drip.   Respiratory:  Negative for cough.   Cardiovascular:  Negative for chest pain.  Genitourinary:  Positive for dysuria, frequency, pelvic pain, urgency and vaginal pain.  Psychiatric/Behavioral: Negative.      Physical Exam Vitals and nursing note reviewed.  Constitutional:  Appearance: Normal appearance.  HENT:     Head: Normocephalic and atraumatic.     Nose: Nose normal.     Mouth/Throat:     Mouth: Mucous membranes are moist.     Pharynx: No posterior oropharyngeal erythema.  Eyes:     Extraocular Movements: Extraocular movements intact.     Pupils: Pupils are equal, round, and reactive to light.  Cardiovascular:     Rate and Rhythm: Normal rate and regular rhythm.     Pulses: Normal pulses.     Heart sounds: Normal heart sounds.  Pulmonary:     Effort: Pulmonary effort is normal.     Breath sounds: Normal breath sounds.  Skin:    General: Skin is warm and dry.  Neurological:     General: No focal deficit  present.     Mental Status: She is alert.  Psychiatric:        Mood and Affect: Mood normal.        Behavior: Behavior normal.       Assessment/Plan: 1. Urinary tract infection with hematuria, site unspecified Urine culture sent Due to past UTI with multi drug resistant bacteria, will treat with doxycycline pending new urine culture results.  - POCT urinalysis dipstick - doxycycline (VIBRA-TABS) 100 MG tablet; Take 1 tablet (100 mg total) by mouth 2 (two) times daily for 7 days. Take with food  Dispense: 14 tablet; Refill: 0 - CULTURE, URINE COMPREHENSIVE  2. Recurrent UTI Also refer to urology for further evaluation of recurrent UTIs - doxycycline (VIBRA-TABS) 100 MG tablet; Take 1 tablet (100 mg total) by mouth 2 (two) times daily for 7 days. Take with food  Dispense: 14 tablet; Refill: 0 - Ambulatory referral to Urology  3. 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 I have discussed any further diagnostic evaluation that may be needed or ordered today. We also reviewed her medications today. she has been encouraged to call the office with any questions or concerns that should arise related to todays visit.    Counseling:    Orders Placed This Encounter  Procedures   Ambulatory referral to Urology   POCT urinalysis dipstick    Meds ordered this encounter  Medications   doxycycline (VIBRA-TABS) 100 MG tablet    Sig: Take 1 tablet (100 mg total) by mouth 2 (two) times daily for 7 days. Take with food    Dispense:  14 tablet    Refill:  0   fluconazole (DIFLUCAN) 150 MG tablet    Sig: Take 1 tablet (150 mg total) by mouth once for 1 dose. May take an additional dose after 3 days if still symptomatic.    Dispense:  3 tablet    Refill:  0    Return if symptoms worsen or fail to  improve.  [place] Controlled Substance Database was reviewed by me for overdose risk score (ORS)  Time spent:30 Minutes Time spent with patient included reviewing progress notes, labs, imaging studies, and discussing plan for follow up.   This patient was seen by Sallyanne Kuster, FNP-C in collaboration with Dr. Beverely Risen as a part of collaborative care agreement.  Donnavin Vandenbrink R. Tedd Sias, MSN, FNP-C Internal Medicine

## 2023-06-12 LAB — CULTURE, URINE COMPREHENSIVE

## 2023-06-13 ENCOUNTER — Telehealth: Payer: Self-pay

## 2023-06-14 ENCOUNTER — Encounter: Payer: Self-pay | Admitting: Nurse Practitioner

## 2023-06-14 MED ORDER — CIPROFLOXACIN HCL 500 MG PO TABS: 500.0000 mg | ORAL_TABLET | Freq: Two times a day (BID) | ORAL | 0 refills | Status: AC

## 2023-06-14 NOTE — Telephone Encounter (Signed)
Lmom for pt that we send antibiotic

## 2023-06-22 ENCOUNTER — Ambulatory Visit: Payer: PPO | Admitting: Dermatology

## 2023-07-05 ENCOUNTER — Ambulatory Visit
Admission: RE | Admit: 2023-07-05 | Discharge: 2023-07-05 | Disposition: A | Discharge status: Home / Self Care | Payer: PPO | Source: Ambulatory Visit | Attending: Internal Medicine | Admitting: Internal Medicine

## 2023-07-05 DIAGNOSIS — Z1231 Encounter for screening mammogram for malignant neoplasm of breast: Secondary | ICD-10-CM | POA: Diagnosis not present

## 2023-07-08 ENCOUNTER — Other Ambulatory Visit: Payer: Self-pay | Admitting: Internal Medicine

## 2023-07-08 DIAGNOSIS — J3089 Other allergic rhinitis: Secondary | ICD-10-CM

## 2023-07-13 ENCOUNTER — Ambulatory Visit: Payer: PPO | Admitting: Urology

## 2023-07-13 VITALS — BP 149/79 | HR 67 | Ht 63.0 in | Wt 162.0 lb

## 2023-07-13 DIAGNOSIS — Z87440 Personal history of urinary (tract) infections: Secondary | ICD-10-CM | POA: Diagnosis not present

## 2023-07-13 DIAGNOSIS — Z09 Encounter for follow-up examination after completed treatment for conditions other than malignant neoplasm: Secondary | ICD-10-CM | POA: Diagnosis not present

## 2023-07-13 DIAGNOSIS — N390 Urinary tract infection, site not specified: Secondary | ICD-10-CM | POA: Diagnosis not present

## 2023-07-13 LAB — URINALYSIS, COMPLETE
Bilirubin, UA: NEGATIVE
Glucose, UA: NEGATIVE
Ketones, UA: NEGATIVE
Leukocytes,UA: NEGATIVE
Nitrite, UA: NEGATIVE
Protein,UA: NEGATIVE
RBC, UA: NEGATIVE
Specific Gravity, UA: 1.025 (ref 1.005–1.030)
Urobilinogen, Ur: 0.2 mg/dL (ref 0.2–1.0)
pH, UA: 6 (ref 5.0–7.5)

## 2023-07-13 LAB — MICROSCOPIC EXAMINATION

## 2023-07-13 LAB — BLADDER SCAN AMB NON-IMAGING: Scan Result: 50

## 2023-07-13 MED ORDER — PREMARIN 0.625 MG/GM VA CREA: 1.0000 | TOPICAL_CREAM | Freq: Every day | VAGINAL | 12 refills | Status: DC

## 2023-07-13 NOTE — Patient Instructions (Addendum)
Recurrent UTI Prevention Strategies  Stay well hydrated. Get a moderate amount of exercise. Eat a diet rich in fruit and vegetables. Start a bowel regimen to manage your constipation if applicable. Your goal is to have consistent, formed bowel movements that are easy for you to pass. You may use either of the over-the-counter supplements Benefiber or Miralax to help with this. I recommend that you try Benefiber first and move on to Miralax if this is not helping you enough. You may adjust the recommended dose of Miralax (one capful daily) to achieve this goal. Start taking an over-the-counter cranberry supplement for urinary tract health. Take this once or twice daily on an empty stomach, e.g. right before bed. Start taking an over-the-counter d-mannose supplement. Take this daily per packaging instructions. Start taking an over-the-counter probiotic containing the bacterial species called Lactobacillus. Take this daily. Start vaginal estrogen cream. Apply a pea-sized amount around the opening of the urethra every day for 2 weeks, then three times weekly forever.

## 2023-07-13 NOTE — Progress Notes (Signed)
Marissa Wilson,acting as a scribe for Marissa Scotland, MD.,have documented all relevant documentation on the behalf of Marissa Scotland, MD,as directed by  Marissa Scotland, MD while in the presence of Marissa Scotland, MD.  07/13/23 4:10 PM   Marissa Wilson 03-Oct-1945 [id number]  Referring provider: Sallyanne Kuster, NP [address] [place],  [place] [address]  Chief Complaint  Patient presents with   Establish Care   Recurrent UTI    HPI: 77 year-old female who presents today for further evaluation of recurrent urinary tract infections.   She called the after hours line in early June, requesting a prescription for Cipro for a urinary tract infection and this was given. She improved somewhat, but never completely. She was seen in follow up in 01/2023 with pelvic pressure, burning, urgency frequency, and vaginal irritation. Her urinalysis was mildly suspicious and urine culture grew a low colony count of a fairly resistant bug, resistant to all antibiotics except for cefuroxime, gentamicin, imipenem and Macrobid. She was prescribed Macrobid on this occasion, but unfortunately, she developed shortness of breat, nausea, and itching. Her prescription was then changed to doxycycline. Her urine was checked subsequently and clear with no sign of infection.   More recently, on 06/07/2023, she was seen again. She had some trace blood and leukocytes on dip, but no microscopic was performed. Urine culture grew again, a very low colony count, this time a pan-sensitive E.coli. Upper tract imaging includes an abdominal MRI performed last month that shows a cystic structure rising from the pancreatic head, but otherwise, no GU pathology was identified. She was started on Vagifem tablets as of 04/07/2023, twice a day  Her urinalysis today was negative with no microscopic blood.   Today, she reports that she is not using the estradiol cream. She has not used preventive measures such as cranberry tablets,  probiotics, or D-Mannose. Symptoms include a burning sensation that lingers after urination.    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   Osteoarthritis    Sleep apnea    Thyroid disease    UTI (urinary tract infection)     Surgical History: Past Surgical History:  Procedure Laterality Date   ABDOMINAL HYSTERECTOMY     ABLATION SAPHENOUS VEIN W/ RFA Right 05/21/2021   COLONOSCOPY WITH PROPOFOL N/A 06/20/2020   Procedure: COLONOSCOPY WITH PROPOFOL;  Surgeon: Earline Mayotte, MD;  Location: [facility] ENDOSCOPY;  Service: Endoscopy;  Laterality: N/A;   ESOPHAGOGASTRODUODENOSCOPY (EGD) WITH PROPOFOL N/A 06/20/2020   Procedure: ESOPHAGOGASTRODUODENOSCOPY (EGD) WITH PROPOFOL;  Surgeon: Earline Mayotte, MD;  Location: [facility] ENDOSCOPY;  Service: Endoscopy;  Laterality: N/A;  COVID POSITIVE 05/19/20   PR ENDOVENOUS LASTER, 1ST VEIN Right 06/09/2017   Procedure: EVLA of Right GSV; Surgeon: Derry Skill, MD; Location: MAIN OR [facility]; Service: Vascular   PR LIGATN LONG SAPHENOUS VEIN AT SEPH-FEM JUNC Right 06/09/2017   Procedure: LIGATION & DIVISION OF LONG SAPHENOUS VEIN AT SAPHENOFEMORAL JUNCTION, OR DISTAL INTERRUPTIONS; Surgeon: Chrissie Noa  Orvilla Fus, MD; Location: MAIN OR [facility] [address]; Service: Vascular   PR PHLEB VEINS EXTREM TO 20 Right 06/09/2017   Procedure: STAB PHLEBECTOMY OF VARICOSE VEINS, 1 EXTREMITY: 10-20 STAB INCISIONS; Surgeon: Derry Skill, MD; Location: MAIN OR [facility]; Service: Vascular    Home Medications:  Allergies as of  07/13/2023       Reactions   Amoxicillin    Ampicillin Diarrhea   CDC   Cefazolin Diarrhea   Other reaction(s): Other (comments) cdc CDC   Macrobid [nitrofurantoin]    Sob ,nausea and itchy   Nsaids    Other reaction(s): Unknown   Penicillins Diarrhea   Other reaction(s): Other (comments)  CDC CDC CDC        Medication List        Accurate as of July 13, 2023  4:10 PM. If you have any questions, ask your nurse or doctor.          STOP taking these medications    Estradiol 10 MCG Tabs vaginal tablet Commonly known as: Vagifem Stopped by: Marissa Wilson   rosuvastatin 5 MG tablet Commonly known as: Crestor Stopped by: Marissa Wilson       TAKE these medications    acetaminophen 650 MG CR tablet Commonly known as: TYLENOL Take 650 mg every 6 (six) hours by mouth.   cetirizine 10 MG tablet Commonly known as: ZYRTEC TAKE 1 TABLET (10 MG TOTAL) BY MOUTH DAILY. FOR ALLERGIES (NOT COVERED)   furosemide 20 MG tablet Commonly known as: LASIX TAKE 1 TABLET BY MOUTH EVERY DAY   levothyroxine 75 MCG tablet Commonly known as: SYNTHROID TAKE 1 TABLET BY MOUTH EVERY DAY   montelukast 10 MG tablet Commonly known as: SINGULAIR TAKE 1 TABLET BY MOUTH EVERYDAY AT BEDTIME   omeprazole 40 MG capsule Commonly known as: PRILOSEC TAKE 1 CAPSULE (40 MG TOTAL) BY MOUTH DAILY.   Premarin vaginal cream Generic drug: conjugated estrogens Place 1 Applicatorful vaginally daily. Use pea sized amount M-W-Fr before bedtime Started by: Marissa Wilson   rivaroxaban 10 MG Tabs tablet Commonly known as: XARELTO Take 10 mg by mouth at bedtime.   VITAMIN-MINERAL SUPPLEMENT PO Take by mouth daily. Mega D-3 & MK-7 5000 IU/180MCG        Allergies:  Allergies  Allergen Reactions   Amoxicillin    Ampicillin Diarrhea    CDC   Cefazolin Diarrhea    Other reaction(s): Other (comments) cdc CDC   Macrobid [Nitrofurantoin]     Sob ,nausea and itchy   Nsaids      Other reaction(s): Unknown   Penicillins Diarrhea    Other reaction(s): Other (comments)  CDC CDC CDC     Family History: Family History  Problem Relation Age of Onset   Breast cancer Mother 57   Varicose Veins Mother    Lung cancer Father    Heart attack Father    Bladder Cancer Neg Hx    Kidney cancer Neg Hx     Social History:  reports that she has never smoked. She has never used smokeless tobacco. She reports current alcohol use. She reports that she does not use drugs.   Physical Exam: BP (!) 149/79   Pulse 67   Ht 5\' 3"  (1.6 m)   Wt 162 lb (73.5 kg)   BMI 28.70 kg/m   Constitutional:  Alert and oriented, No acute distress. HEENT: [place] AT, moist mucus membranes.  Trachea midline, no masses. Neurologic: Grossly intact, no focal deficits, moving all 4 extremities. Psychiatric:  Normal mood and affect.   Assessment & Plan:    1. Recurrent UTI - Educate provided on the safety of topical estrogen use, even with a history of blood clots, due to minimal systemic absorption. - Continue monitoring urine cultures to ensure resolution of infection.  Recurrent UTI Prevention Strategies  Stay well hydrated. Get a moderate amount of exercise. Eat a diet rich in fruit and vegetables. Start a bowel regimen to manage your constipation. Your goal is to have consistent, formed bowel movements that are easy for you to pass. You may use either of the over-the-counter supplements Benefiber or Miralax to help with this. I recommend that you try Benefiber first and move on to Miralax if this is not helping you enough. You may adjust the recommended dose of Miralax (one capful daily) to achieve this goal. Start taking an over-the-counter cranberry supplement for urinary tract health. Take this once or twice daily on an empty stomach, e.g. right before bed. Start taking an over-the-counter d-mannose supplement. Take this daily per packaging instructions. Start taking an over-the-counter  probiotic containing the bacterial species called Lactobacillus. Take this daily. Start vaginal estrogen cream. Apply a pea-sized amount around the opening of the urethra every day for 2 weeks, then three times weekly forever.    [facility] [address], [address] [place], [place] [address] [phone]

## 2023-07-14 ENCOUNTER — Other Ambulatory Visit: Payer: Self-pay | Admitting: Physician Assistant

## 2023-07-28 NOTE — Progress Notes (Signed)
Cancelled.  

## 2023-08-22 ENCOUNTER — Telehealth: Payer: Self-pay | Admitting: Internal Medicine

## 2023-08-22 IMAGING — MR MR ABDOMEN WO/W CM
18 of 21 series · 43 of 48 positions shown · IV contrast (7 ml Gadavist)
Comparison: MRI April 28, 2020

CLINICAL DATA: Follow-up pancreatic cyst/pseudocyst. One week of
abdominal pain.

EXAM:
MRI ABDOMEN WITHOUT AND WITH CONTRAST
TECHNIQUE: Multiplanar multisequence MR imaging of the abdomen was performed
both before and after the administration of intravenous contrast.
CONTRAST:  7mL GADAVIST GADOBUTROL 1 MMOL/ML IV SOLN

[Series 2: cor haste · coronal · 6.0mm · 1.19mm/px · 2 of 32 slices shown]
[im 1/32]
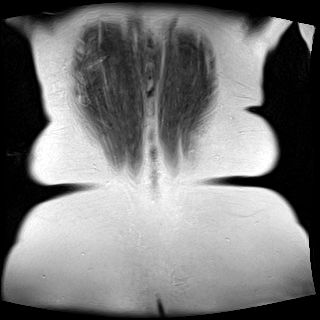
[im 32/32]
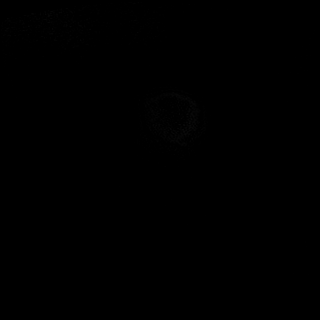

[Series 3: ax haste · axial · 6.0mm · 1.19mm/px · z∈[-40,+184]mm · 2 of 32 slices shown]
[im 1/32]
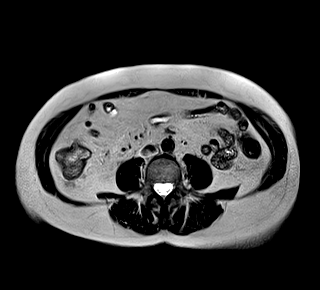
[im 32/32]
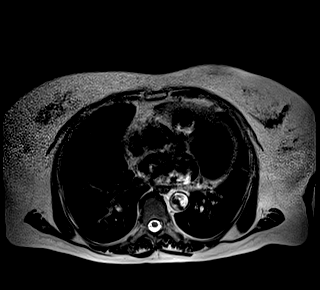

[Series 5: T2 fat-sat · axial · 6.0mm · 1.19mm/px · 1 of 30 slices shown]
[im 1/30]
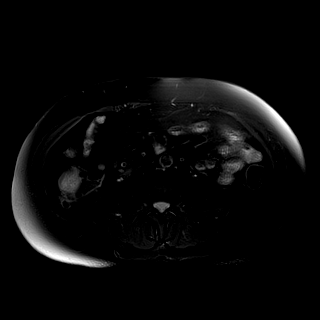

[Series 7: ax dwi_tracew · axial · 6.0mm · 1.42mm/px · z∈[-27,+182]mm · 4 of 90 slices shown]
[im 1/90]
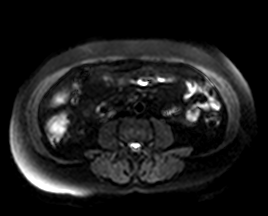
[im 30/90]
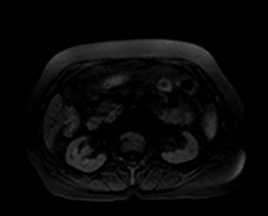
[im 60/90]
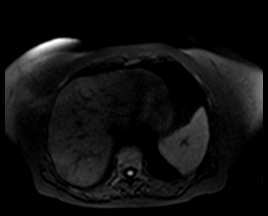
[im 90/90]
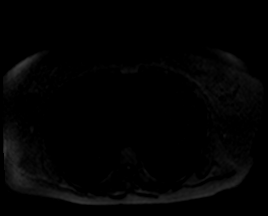

[Series 8: ax dwi_adc · axial · 6.0mm · 1.42mm/px · 1 of 30 slices shown]
[im 1/30]
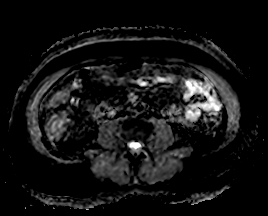

[Series 9: T1 · axial · 6.0mm · 0.74mm/px · 1 of 32 slices shown (1 of 2)]
[im 1/32]
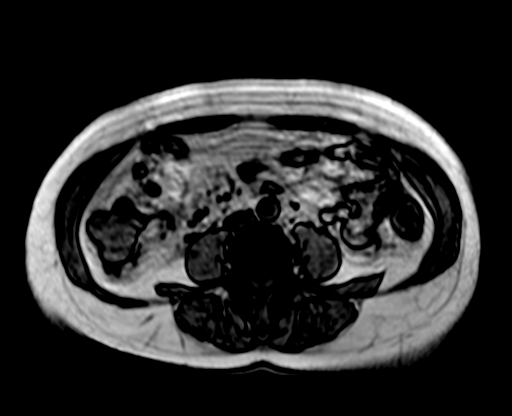

[Series 9: T1 · axial · 6.0mm · 0.74mm/px · 1 of 32 slices shown (2 of 2)]
[im 1/32]
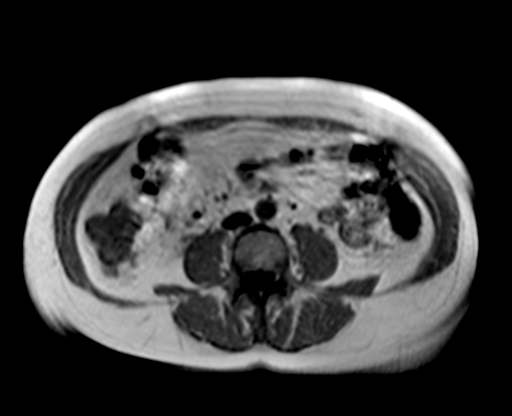

[Series 13: bSSFP · axial · 6.0mm · 0.74mm/px · 1 of 32 slices shown]
[im 1/32]
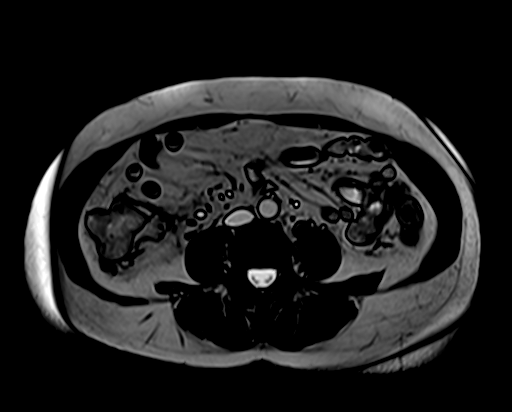

[Series 14: T1 dynamic fat-sat · axial · non-contrast · 3.0mm · 1.19mm/px · z∈[-35,+178]mm · 3 of 72 slices shown (1 of 5)]
[im 1/72]
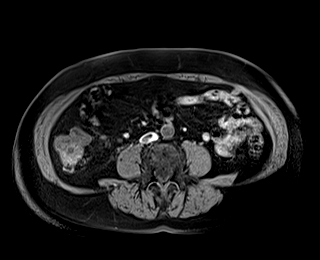
[im 36/72]
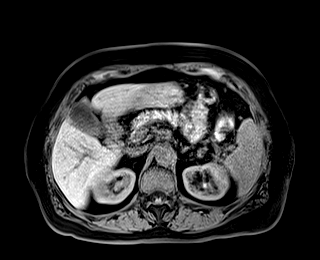
[im 72/72]
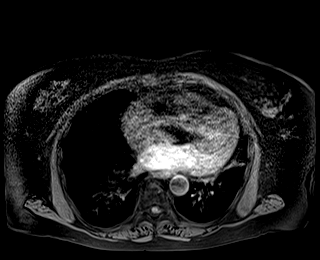

[Series 15: T1 dynamic fat-sat post-contrast · axial · 3.0mm · 1.19mm/px · z∈[-35,+178]mm · 3 of 72 slices shown (1 of 4)]
[im 1/72]
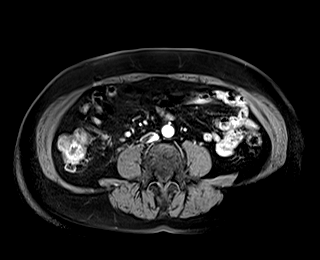
[im 36/72]
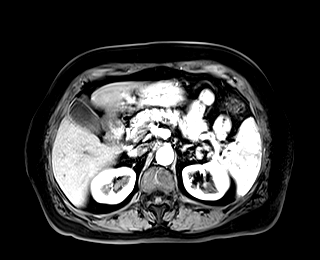
[im 72/72]
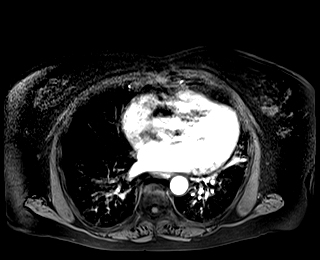

[Series 16: T1 dynamic fat-sat · axial · 3.0mm · 1.19mm/px · z∈[-35,+178]mm · 3 of 72 slices shown (2 of 5)]
[im 1/72]
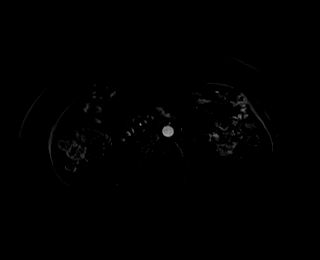
[im 36/72]
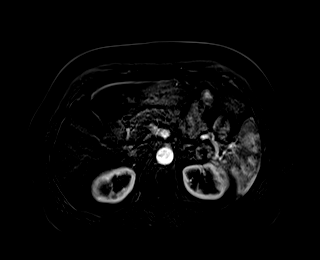
[im 72/72]
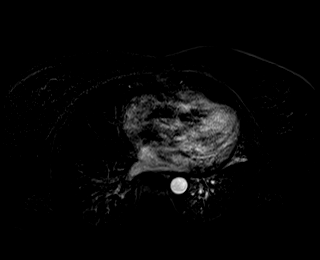

[Series 17: T1 dynamic fat-sat post-contrast · axial · 3.0mm · 1.19mm/px · z∈[-35,+178]mm · 3 of 72 slices shown (2 of 4)]
[im 1/72]
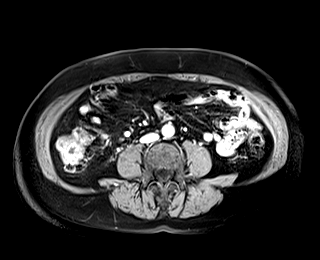
[im 36/72]
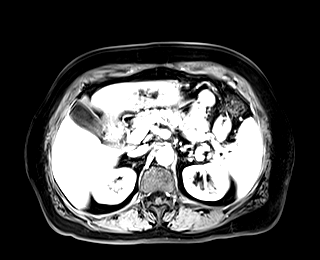
[im 72/72]
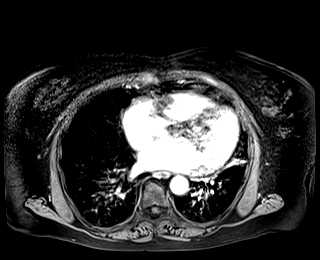

[Series 18: T1 dynamic fat-sat · axial · 3.0mm · 1.19mm/px · z∈[-35,+178]mm · 3 of 72 slices shown (3 of 5)]
[im 1/72]
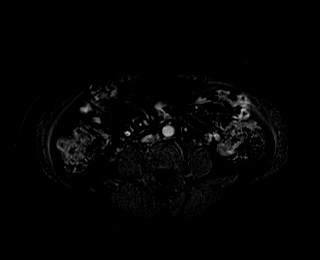
[im 36/72]
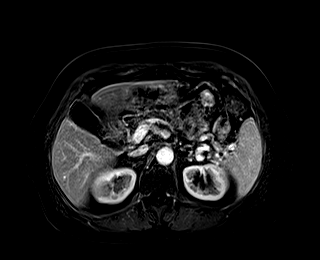
[im 72/72]
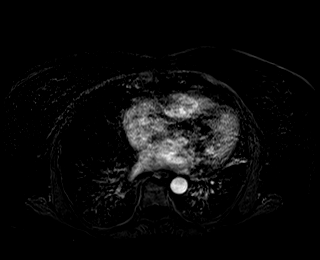

[Series 19: T1 dynamic fat-sat post-contrast · axial · 3.0mm · 1.19mm/px · z∈[-35,+178]mm · 3 of 72 slices shown (3 of 4)]
[im 1/72]
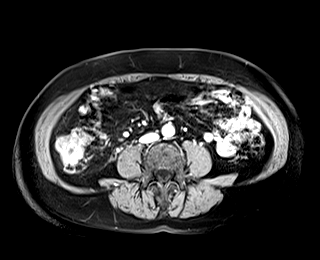
[im 36/72]
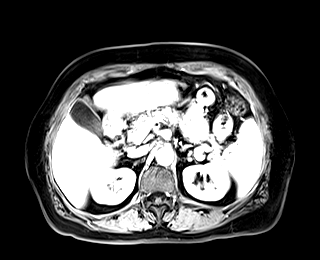
[im 72/72]
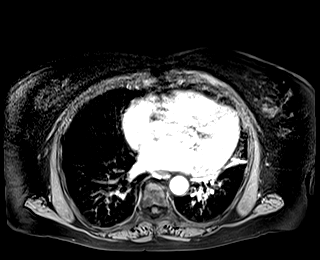

[Series 20: T1 dynamic fat-sat · axial · 3.0mm · 1.19mm/px · z∈[-35,+178]mm · 3 of 72 slices shown (4 of 5)]
[im 1/72]
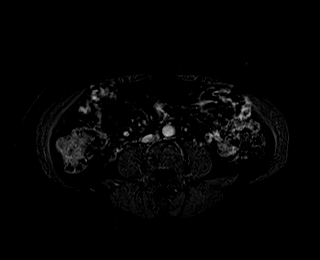
[im 36/72]
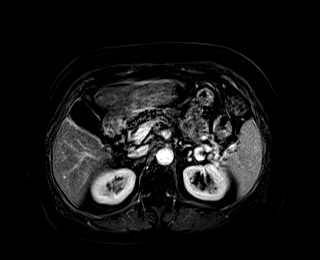
[im 72/72]
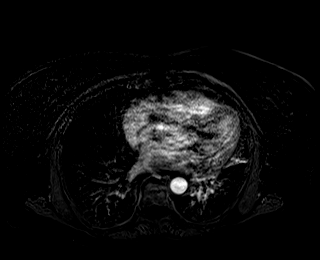

[Series 21: T1 dynamic post-contrast · coronal · 3.0mm · 1.31mm/px · 3 of 80 slices shown]
[im 1/80]
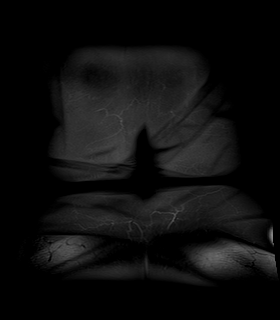
[im 40/80]
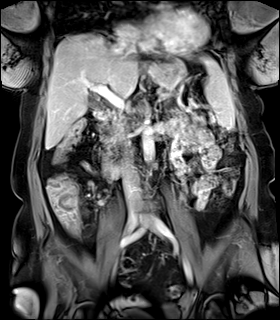
[im 80/80]
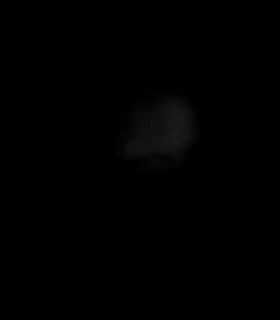

[Series 22: T1 dynamic fat-sat post-contrast · axial · 3.0mm · 1.19mm/px · z∈[-35,+178]mm · 3 of 72 slices shown (4 of 4)]
[im 1/72]
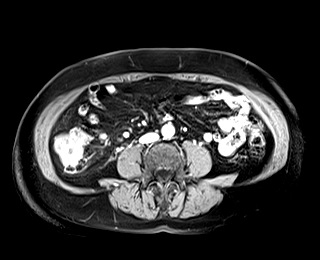
[im 36/72]
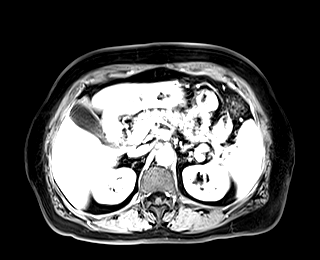
[im 72/72]
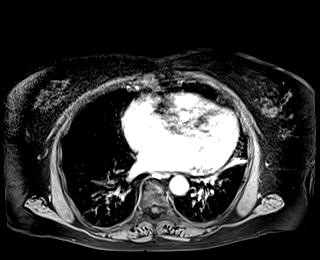

[Series 23: T1 dynamic fat-sat · axial · 3.0mm · 1.19mm/px · z∈[-35,+178]mm · 3 of 72 slices shown (5 of 5)]
[im 1/72]
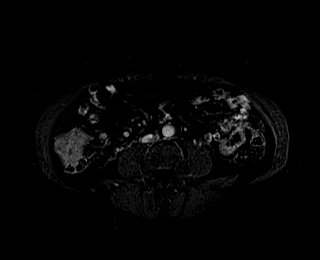
[im 36/72]
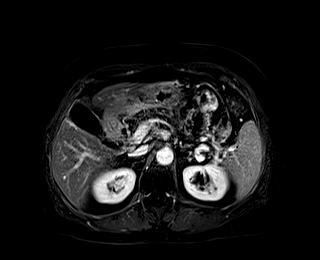
[im 72/72]
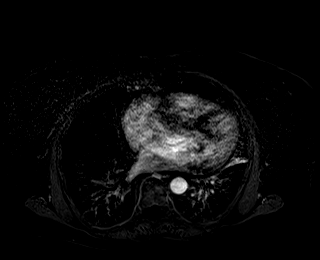

[43 of 48 positions shown; findings below may reference images not displayed]

FINDINGS: Lower chest: No acute abnormality.

Hepatobiliary: No hepatic steatosis. No suspicious hepatic lesion.
The gallbladder is unremarkable. No biliary ductal dilation.

Pancreas: Slight increase in size in the unilocular
well-circumscribed cystic lesion in the pancreatic head which now
measures 2.3 x 2.0 X 1.9 cm on images [DATE] and [DATE] previously 2.0 x
1.8 x 1.7 cm. No suspicious enhancing soft tissue nodularity, wall
thickening or thickened septations. No pancreatic ductal dilation.
No evidence of acute inflammation. Additional sub 4 mm T2 bright
spots in the pancreatic head for instance on image [DATE] are stable
from prior.

Spleen:  Within normal limits in size and appearance.

Adrenals/Urinary Tract: Bilateral adrenal glands are unremarkable.
No hydronephrosis. No solid enhancing renal mass.

Stomach/Bowel: Small hiatal hernia otherwise unremarkable
nondistended stomach. No pathologic dilation or evidence of acute
inflammation involving loops of small or large bowel in the abdomen.

Vascular/Lymphatic: No abdominal aortic aneurysm. The portal,
splenic and superior mesenteric veins are patent. No pathologically
enlarged abdominal lymph nodes.

Other:  No abdominal ascites.

Musculoskeletal: No suspicious bone lesions identified.
IMPRESSION: 1. Slight increase in size in the unilocular well-circumscribed
cystic lesion in the pancreatic head without high risk MRI features.
Findings likely represent a side branch IPMN or pseudocyst.
Recommend follow up pre and post contrast MRI/MRCP 6 months. This
recommendation follows ACR consensus guidelines: Management of
Incidental Pancreatic Cysts: A White Paper of the ACR Incidental
Findings Committee. [HOSPITAL] 8841;[DATE].
2. Small hiatal hernia.
3. No acute findings in the abdomen.

## 2023-08-22 NOTE — Telephone Encounter (Signed)
 Lvm to move 09/13/23 appt-Toni

## 2023-09-01 DIAGNOSIS — I8012 Phlebitis and thrombophlebitis of left femoral vein: Secondary | ICD-10-CM | POA: Diagnosis not present

## 2023-09-12 ENCOUNTER — Ambulatory Visit: Payer: PPO | Admitting: Nurse Practitioner

## 2023-09-13 ENCOUNTER — Ambulatory Visit: Payer: PPO | Admitting: Internal Medicine

## 2023-09-21 DIAGNOSIS — I10 Essential (primary) hypertension: Secondary | ICD-10-CM | POA: Diagnosis not present

## 2023-09-21 DIAGNOSIS — K219 Gastro-esophageal reflux disease without esophagitis: Secondary | ICD-10-CM | POA: Diagnosis not present

## 2023-09-21 DIAGNOSIS — E039 Hypothyroidism, unspecified: Secondary | ICD-10-CM | POA: Diagnosis not present

## 2023-09-21 DIAGNOSIS — I8290 Acute embolism and thrombosis of unspecified vein: Secondary | ICD-10-CM | POA: Diagnosis not present

## 2023-09-27 ENCOUNTER — Ambulatory Visit (INDEPENDENT_AMBULATORY_CARE_PROVIDER_SITE_OTHER): Payer: PPO | Admitting: Internal Medicine

## 2023-09-27 ENCOUNTER — Encounter: Payer: Self-pay | Admitting: Internal Medicine

## 2023-09-27 ENCOUNTER — Telehealth: Payer: Self-pay | Admitting: Internal Medicine

## 2023-09-27 VITALS — BP 130/70 | HR 64 | Temp 98.7°F | Resp 16 | Ht 62.5 in | Wt 162.0 lb

## 2023-09-27 DIAGNOSIS — R300 Dysuria: Secondary | ICD-10-CM

## 2023-09-27 DIAGNOSIS — Z0001 Encounter for general adult medical examination with abnormal findings: Secondary | ICD-10-CM | POA: Diagnosis not present

## 2023-09-27 DIAGNOSIS — K219 Gastro-esophageal reflux disease without esophagitis: Secondary | ICD-10-CM | POA: Diagnosis not present

## 2023-09-27 DIAGNOSIS — E2839 Other primary ovarian failure: Secondary | ICD-10-CM

## 2023-09-27 DIAGNOSIS — E039 Hypothyroidism, unspecified: Secondary | ICD-10-CM

## 2023-09-27 DIAGNOSIS — R499 Unspecified voice and resonance disorder: Secondary | ICD-10-CM | POA: Diagnosis not present

## 2023-09-27 DIAGNOSIS — E782 Mixed hyperlipidemia: Secondary | ICD-10-CM | POA: Diagnosis not present

## 2023-09-27 NOTE — Telephone Encounter (Signed)
GI referral sent via Epic to [facility]. Highlighted for patient on AVS-Toni

## 2023-09-27 NOTE — Progress Notes (Unsigned)
[facility] [address] [place], [place] [address]  Internal MEDICINE  Office Visit Note  Patient Name: Marissa Wilson  [id number]  [id number]  Date of Service: 09/28/2023  Chief Complaint  Patient presents with   Medicare Wellness   Hypertension   Hyperlipidemia   Quality Metric Gaps    Pneumonia Vaccine    HPI Pt is see for awv. Worsening postnasal drip. 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   cetirizine (ZYRTEC) 10 MG tablet TAKE 1 TABLET (10 MG TOTAL) BY MOUTH DAILY. FOR ALLERGIES (NOT COVERED)   conjugated estrogens (PREMARIN) vaginal cream Place 1 Applicatorful vaginally daily. Use pea sized amount M-W-Fr before bedtime   furosemide (LASIX) 20 MG tablet TAKE 1 TABLET BY MOUTH EVERY DAY   levothyroxine (SYNTHROID) 75 MCG tablet TAKE 1 TABLET BY MOUTH EVERY DAY   montelukast (SINGULAIR) 10 MG tablet TAKE 1 TABLET BY MOUTH EVERYDAY AT BEDTIME   Multiple Vitamins-Minerals (VITAMIN-MINERAL SUPPLEMENT PO) Take by mouth daily. Mega D-3 & MK-7 5000 IU/180MCG   omeprazole (PRILOSEC) 40 MG capsule TAKE 1 CAPSULE (40 MG TOTAL) BY MOUTH DAILY.   rivaroxaban (XARELTO) 10 MG TABS tablet Take 10 mg by mouth at bedtime.   No facility-administered encounter medications on file as of 09/27/2023.    Surgical History: Past Surgical History:  Procedure Laterality Date   ABDOMINAL HYSTERECTOMY     ABLATION SAPHENOUS VEIN W/ RFA Right 05/21/2021   COLONOSCOPY WITH PROPOFOL N/A 06/20/2020   Procedure: COLONOSCOPY WITH PROPOFOL;  Surgeon: Earline Mayotte, MD;  Location: [facility] ENDOSCOPY;  Service: Endoscopy;  Laterality: N/A;   ESOPHAGOGASTRODUODENOSCOPY (EGD) WITH PROPOFOL N/A  06/20/2020   Procedure: ESOPHAGOGASTRODUODENOSCOPY (EGD) WITH PROPOFOL;  Surgeon: Earline Mayotte, MD;  Location: [facility] ENDOSCOPY;  Service: Endoscopy;  Laterality: N/A;  COVID POSITIVE 05/19/20   PR ENDOVENOUS LASTER, 1ST VEIN Right 06/09/2017   Procedure: EVLA of Right GSV; Surgeon: Derry Skill, MD; Location: MAIN OR [facility]; Service: Vascular   PR LIGATN LONG SAPHENOUS VEIN AT SEPH-FEM JUNC Right 06/09/2017   Procedure: LIGATION & DIVISION OF LONG SAPHENOUS VEIN AT SAPHENOFEMORAL JUNCTION, OR DISTAL INTERRUPTIONS; Surgeon: Derry Skill, MD; Location: MAIN OR [facility]; Service: Vascular   PR PHLEB VEINS EXTREM TO 20 Right 06/09/2017   Procedure: STAB PHLEBECTOMY OF VARICOSE VEINS, 1 EXTREMITY: 10-20 STAB INCISIONS; Surgeon: Derry Skill, MD; Location: MA[facility] OR [facility]; Service: Vascular    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   Osteoarthritis    Sleep apnea    Thyroid disease    UTI (urinary tract infection)     Family History: Family History  Problem Relation Age of Onset   Breast cancer Mother 34   Varicose Veins Mother    Lung cancer Father    Heart attack Father    Bladder Cancer Neg Hx    Kidney cancer  Neg Hx     Social History   Socioeconomic History   Marital status: Married    Spouse name: Rudell Cobb   Number of children: 2   Years of education: 13   Highest education level: Some college, no degree  Occupational History   Not on file  Tobacco Use   Smoking status: Never   Smokeless tobacco: Never  Vaping Use   Vaping status: Never Used  Substance and Sexual Activity   Alcohol use: Yes    Comment: occasional wine   Drug use: No   Sexual activity:  Not on file  Other Topics Concern   Not on file  Social History Narrative   Not on file   Social Drivers of Health   Financial Resource Strain: Low Risk  (05/22/2021)   Received from [facility], [facility]   Overall Financial Resource Strain (CARDIA)    Difficulty of Paying Living Expenses: Not very hard  Food Insecurity: No Food Insecurity (05/22/2021)   Received from [facility], [facility]   Hunger Vital Sign    Worried About Running Out of Food in the Last Year: Never true    Ran Out of Food in the Last Year: Never true  Transportation Needs: No Transportation Needs (05/22/2021)   Received from [facility], [facility]   [facility] - Transportation    Lack of Transportation (Medical): No    Lack of Transportation (Non-Medical): No  Physical Activity: Not on file  Stress: Not on file  Social Connections: Not on file  Intimate Partner Violence: Not on file      Review of Systems  Constitutional:  Negative for chills, fatigue and unexpected weight change.  HENT:  Negative for congestion, rhinorrhea, sneezing and sore throat.   Eyes:  Negative for redness.  Respiratory:  Negative for cough, chest tightness and shortness of breath.   Cardiovascular:  Negative for chest pain and palpitations.  Gastrointestinal:  Negative for abdominal pain, constipation, diarrhea, nausea and vomiting.  Genitourinary:  Negative for dysuria and frequency.  Musculoskeletal:  Negative for arthralgias, back pain, joint swelling and neck pain.  Skin:  Negative for rash.  Neurological: Negative.  Negative for tremors and numbness.  Hematological:  Negative for adenopathy. Does not bruise/bleed easily.  Psychiatric/Behavioral:  Negative for behavioral problems (Depression), sleep disturbance and suicidal ideas. The patient is not nervous/anxious.     Vital Signs: BP 130/70   Pulse 64   Temp 98.7 F (37.1 C)   Resp 16   Ht 5' 2.5" (1.588 m)   Wt 162 lb (73.5 kg)   SpO2  96%   BMI 29.16 kg/m    Physical Exam Constitutional:      General: She is not in acute distress.    Appearance: She is well-developed. She is not diaphoretic.  HENT:     Head: Normocephalic and atraumatic.     Right Ear: External ear normal.     Left Ear: External ear normal.     Nose: Nose normal.     Mouth/Throat:     Pharynx: No oropharyngeal exudate.  Eyes:     General: No scleral icterus.       Right eye: No discharge.        Left eye: No discharge.     Conjunctiva/sclera: Conjunctivae normal.     Pupils: Pupils are equal, round, and reactive to light.  Neck:     Thyroid: No thyromegaly.     Vascular:  No JVD.     Trachea: No tracheal deviation.  Cardiovascular:     Rate and Rhythm: Normal rate and regular rhythm.     Heart sounds: Normal heart sounds. No murmur heard.    No friction rub. No gallop.  Pulmonary:     Effort: Pulmonary effort is normal. No respiratory distress.     Breath sounds: Normal breath sounds. No stridor. No wheezing or rales.  Chest:     Chest wall: No tenderness.  Abdominal:     General: Bowel sounds are normal. There is no distension.     Palpations: Abdomen is soft. There is no mass.     Tenderness: There is no abdominal tenderness. There is no guarding or rebound.  Musculoskeletal:        General: No tenderness or deformity. Normal range of motion.     Cervical back: Normal range of motion and neck supple.  Lymphadenopathy:     Cervical: No cervical adenopathy.  Skin:    General: Skin is warm and dry.     Coloration: Skin is not pale.     Findings: No erythema or rash.  Neurological:     Mental Status: She is alert.     Cranial Nerves: No cranial nerve deficit.     Motor: No abnormal muscle tone.     Coordination: Coordination normal.     Deep Tendon Reflexes: Reflexes are normal and symmetric.  Psychiatric:        Behavior: Behavior normal.        Thought Content: Thought content normal.        Judgment: Judgment normal.         Assessment/Plan: 1. Encounter for general adult medical examination with abnormal findings (Primary) All PHM is updated  - CBC with Differential/Platelet - Lipid Panel With LDL/HDL Ratio - TSH - T4, free - Comprehensive metabolic panel  2. Mixed hyperlipidemia Pt has intolerance to statins due to elevation in her transaminases   3. Hypothyroidism, unspecified type Continue synthroid  - CBC with Differential/Platelet - Lipid Panel With LDL/HDL Ratio - TSH - T4, free - Comprehensive metabolic panel  4. Hoarseness or changing voice Pt has excessive and frequent clearing of her throat here in the office, has been on multiple medications, needs direct visualization  - Ambulatory referral to ENT - CBC with Differential/Platelet - Lipid Panel With LDL/HDL Ratio - TSH - T4, free - Comprehensive metabolic panel  5. GERD without esophagitis Needs to have EGD, has been on prilosec, worsening symptoms  - Ambulatory referral to Gastroenterology  6. 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 I have discussed any further diagnostic evaluation that may be needed or ordered today. We also reviewed her medications today. she has been encouraged to call the office with any questions or concerns that should arise related to todays visit.    Orders Placed This Encounter  Procedures   DG Bone Density   UA/M w/rflx Culture, Routine   CBC with Differential/Platelet   Lipid Panel With LDL/HDL Ratio   TSH   T4, free   Comprehensive metabolic panel   Ambulatory referral to ENT   Ambulatory referral to Gastroenterology  No orders of the defined types were  placed in this encounter.   Total time spent:45 Minutes Time spent includes review of chart, medications, test results, and follow up plan with the patient.   [place] Controlled Substance Database was reviewed by me.   Dr Lyndon Code Internal medicine

## 2023-09-28 LAB — UA/M W/RFLX CULTURE, ROUTINE

## 2023-09-29 ENCOUNTER — Telehealth: Payer: Self-pay | Admitting: Internal Medicine

## 2023-09-29 NOTE — Telephone Encounter (Signed)
Otolaryngology referral sent via [facility] to [facility].  Notified patent. Gave telephone# [phone]-Toni

## 2023-09-29 NOTE — Telephone Encounter (Signed)
Notified patient of dexa appointment date, arrival time, location-Toni

## 2023-10-08 ENCOUNTER — Other Ambulatory Visit: Payer: Self-pay | Admitting: Nurse Practitioner

## 2023-10-08 DIAGNOSIS — Z0001 Encounter for general adult medical examination with abnormal findings: Secondary | ICD-10-CM

## 2023-10-10 DIAGNOSIS — Z881 Allergy status to other antibiotic agents status: Secondary | ICD-10-CM | POA: Diagnosis not present

## 2023-10-10 DIAGNOSIS — K219 Gastro-esophageal reflux disease without esophagitis: Secondary | ICD-10-CM | POA: Diagnosis not present

## 2023-10-10 DIAGNOSIS — I83892 Varicose veins of left lower extremities with other complications: Secondary | ICD-10-CM | POA: Diagnosis not present

## 2023-10-10 DIAGNOSIS — I809 Phlebitis and thrombophlebitis of unspecified site: Secondary | ICD-10-CM | POA: Diagnosis not present

## 2023-10-10 DIAGNOSIS — Z7901 Long term (current) use of anticoagulants: Secondary | ICD-10-CM | POA: Diagnosis not present

## 2023-10-10 DIAGNOSIS — Z79890 Hormone replacement therapy (postmenopausal): Secondary | ICD-10-CM | POA: Diagnosis not present

## 2023-10-10 DIAGNOSIS — Z79899 Other long term (current) drug therapy: Secondary | ICD-10-CM | POA: Diagnosis not present

## 2023-10-10 DIAGNOSIS — E039 Hypothyroidism, unspecified: Secondary | ICD-10-CM | POA: Diagnosis not present

## 2023-10-10 DIAGNOSIS — Z86718 Personal history of other venous thrombosis and embolism: Secondary | ICD-10-CM | POA: Diagnosis not present

## 2023-10-10 DIAGNOSIS — I872 Venous insufficiency (chronic) (peripheral): Secondary | ICD-10-CM | POA: Diagnosis not present

## 2023-10-10 DIAGNOSIS — I10 Essential (primary) hypertension: Secondary | ICD-10-CM | POA: Diagnosis not present

## 2023-10-12 DIAGNOSIS — I878 Other specified disorders of veins: Secondary | ICD-10-CM | POA: Diagnosis not present

## 2023-10-19 ENCOUNTER — Telehealth: Payer: Self-pay | Admitting: Internal Medicine

## 2023-10-19 NOTE — Telephone Encounter (Signed)
 Otolaryngology appointment 11/15/2023 @ [facility]-Toni

## 2023-10-25 DIAGNOSIS — I872 Venous insufficiency (chronic) (peripheral): Secondary | ICD-10-CM | POA: Diagnosis not present

## 2023-11-01 ENCOUNTER — Ambulatory Visit
Admission: RE | Admit: 2023-11-01 | Discharge: 2023-11-01 | Disposition: A | Discharge status: Home / Self Care | Payer: PPO | Source: Ambulatory Visit | Attending: Internal Medicine | Admitting: Internal Medicine

## 2023-11-01 DIAGNOSIS — M8589 Other specified disorders of bone density and structure, multiple sites: Secondary | ICD-10-CM | POA: Diagnosis not present

## 2023-11-01 DIAGNOSIS — E2839 Other primary ovarian failure: Secondary | ICD-10-CM | POA: Diagnosis not present

## 2023-11-15 DIAGNOSIS — R490 Dysphonia: Secondary | ICD-10-CM | POA: Diagnosis not present

## 2023-11-15 DIAGNOSIS — D380 Neoplasm of uncertain behavior of larynx: Secondary | ICD-10-CM | POA: Diagnosis not present

## 2023-11-16 ENCOUNTER — Other Ambulatory Visit: Payer: Self-pay | Admitting: Otolaryngology

## 2023-11-16 DIAGNOSIS — D141 Benign neoplasm of larynx: Secondary | ICD-10-CM

## 2023-11-23 ENCOUNTER — Ambulatory Visit
Admission: RE | Admit: 2023-11-23 | Discharge: 2023-11-23 | Disposition: A | Discharge status: Home / Self Care | Source: Ambulatory Visit | Attending: Otolaryngology | Admitting: Otolaryngology

## 2023-11-23 DIAGNOSIS — D141 Benign neoplasm of larynx: Secondary | ICD-10-CM

## 2023-11-23 DIAGNOSIS — J383 Other diseases of vocal cords: Secondary | ICD-10-CM | POA: Diagnosis not present

## 2023-11-23 MED ORDER — IOPAMIDOL (ISOVUE-300) INJECTION 61%
75.0000 mL | Freq: Once | INTRAVENOUS | Status: AC | PRN
  Administered 2023-11-23: 75 mL via INTRAVENOUS

## 2023-12-16 DIAGNOSIS — I872 Venous insufficiency (chronic) (peripheral): Secondary | ICD-10-CM | POA: Diagnosis not present

## 2023-12-16 DIAGNOSIS — Z9889 Other specified postprocedural states: Secondary | ICD-10-CM | POA: Diagnosis not present

## 2023-12-16 DIAGNOSIS — I83892 Varicose veins of left lower extremities with other complications: Secondary | ICD-10-CM | POA: Diagnosis not present

## 2023-12-16 DIAGNOSIS — Z09 Encounter for follow-up examination after completed treatment for conditions other than malignant neoplasm: Secondary | ICD-10-CM | POA: Diagnosis not present

## 2023-12-20 DIAGNOSIS — K219 Gastro-esophageal reflux disease without esophagitis: Secondary | ICD-10-CM | POA: Diagnosis not present

## 2023-12-20 DIAGNOSIS — D380 Neoplasm of uncertain behavior of larynx: Secondary | ICD-10-CM | POA: Diagnosis not present

## 2023-12-20 DIAGNOSIS — E041 Nontoxic single thyroid nodule: Secondary | ICD-10-CM | POA: Diagnosis not present

## 2023-12-22 ENCOUNTER — Telehealth: Payer: Self-pay | Admitting: Internal Medicine

## 2023-12-22 DIAGNOSIS — J383 Other diseases of vocal cords: Secondary | ICD-10-CM | POA: Diagnosis not present

## 2023-12-22 NOTE — Telephone Encounter (Signed)
 S/w patient about GI referral. She stated she has other health issues right now, requesting to cancel GI referral-Toni

## 2023-12-27 DIAGNOSIS — I872 Venous insufficiency (chronic) (peripheral): Secondary | ICD-10-CM | POA: Diagnosis not present

## 2023-12-28 DIAGNOSIS — Z880 Allergy status to penicillin: Secondary | ICD-10-CM | POA: Diagnosis not present

## 2023-12-28 DIAGNOSIS — I739 Peripheral vascular disease, unspecified: Secondary | ICD-10-CM | POA: Diagnosis not present

## 2023-12-28 DIAGNOSIS — E039 Hypothyroidism, unspecified: Secondary | ICD-10-CM | POA: Diagnosis not present

## 2023-12-28 DIAGNOSIS — I10 Essential (primary) hypertension: Secondary | ICD-10-CM | POA: Diagnosis not present

## 2023-12-28 DIAGNOSIS — Z7902 Long term (current) use of antithrombotics/antiplatelets: Secondary | ICD-10-CM | POA: Diagnosis not present

## 2023-12-28 DIAGNOSIS — Z86718 Personal history of other venous thrombosis and embolism: Secondary | ICD-10-CM | POA: Diagnosis not present

## 2023-12-28 DIAGNOSIS — Z79899 Other long term (current) drug therapy: Secondary | ICD-10-CM | POA: Diagnosis not present

## 2023-12-28 DIAGNOSIS — K219 Gastro-esophageal reflux disease without esophagitis: Secondary | ICD-10-CM | POA: Diagnosis not present

## 2023-12-28 DIAGNOSIS — J383 Other diseases of vocal cords: Secondary | ICD-10-CM | POA: Diagnosis not present

## 2023-12-28 DIAGNOSIS — Z881 Allergy status to other antibiotic agents status: Secondary | ICD-10-CM | POA: Diagnosis not present

## 2023-12-28 DIAGNOSIS — Z7901 Long term (current) use of anticoagulants: Secondary | ICD-10-CM | POA: Diagnosis not present

## 2023-12-28 DIAGNOSIS — Z886 Allergy status to analgesic agent status: Secondary | ICD-10-CM | POA: Diagnosis not present

## 2023-12-28 DIAGNOSIS — Z79890 Hormone replacement therapy (postmenopausal): Secondary | ICD-10-CM | POA: Diagnosis not present

## 2024-01-06 ENCOUNTER — Other Ambulatory Visit: Payer: Self-pay | Admitting: Nurse Practitioner

## 2024-01-10 DIAGNOSIS — Z79899 Other long term (current) drug therapy: Secondary | ICD-10-CM | POA: Diagnosis not present

## 2024-01-10 DIAGNOSIS — Z86718 Personal history of other venous thrombosis and embolism: Secondary | ICD-10-CM | POA: Diagnosis not present

## 2024-01-10 DIAGNOSIS — Z7901 Long term (current) use of anticoagulants: Secondary | ICD-10-CM | POA: Diagnosis not present

## 2024-01-10 DIAGNOSIS — I809 Phlebitis and thrombophlebitis of unspecified site: Secondary | ICD-10-CM | POA: Diagnosis not present

## 2024-01-10 DIAGNOSIS — I10 Essential (primary) hypertension: Secondary | ICD-10-CM | POA: Diagnosis not present

## 2024-01-10 DIAGNOSIS — J383 Other diseases of vocal cords: Secondary | ICD-10-CM | POA: Diagnosis not present

## 2024-01-10 DIAGNOSIS — Z79890 Hormone replacement therapy (postmenopausal): Secondary | ICD-10-CM | POA: Diagnosis not present

## 2024-01-10 DIAGNOSIS — R490 Dysphonia: Secondary | ICD-10-CM | POA: Diagnosis not present

## 2024-01-10 DIAGNOSIS — J387 Other diseases of larynx: Secondary | ICD-10-CM | POA: Diagnosis not present

## 2024-01-10 DIAGNOSIS — J382 Nodules of vocal cords: Secondary | ICD-10-CM | POA: Diagnosis not present

## 2024-01-10 DIAGNOSIS — E039 Hypothyroidism, unspecified: Secondary | ICD-10-CM | POA: Diagnosis not present

## 2024-01-10 DIAGNOSIS — K219 Gastro-esophageal reflux disease without esophagitis: Secondary | ICD-10-CM | POA: Diagnosis not present

## 2024-01-18 DIAGNOSIS — Z09 Encounter for follow-up examination after completed treatment for conditions other than malignant neoplasm: Secondary | ICD-10-CM | POA: Diagnosis not present

## 2024-01-18 DIAGNOSIS — J387 Other diseases of larynx: Secondary | ICD-10-CM | POA: Diagnosis not present

## 2024-02-02 DIAGNOSIS — Z7901 Long term (current) use of anticoagulants: Secondary | ICD-10-CM | POA: Diagnosis not present

## 2024-02-02 DIAGNOSIS — I8010 Phlebitis and thrombophlebitis of unspecified femoral vein: Secondary | ICD-10-CM | POA: Diagnosis not present

## 2024-02-02 DIAGNOSIS — Z5181 Encounter for therapeutic drug level monitoring: Secondary | ICD-10-CM | POA: Diagnosis not present

## 2024-02-10 ENCOUNTER — Other Ambulatory Visit: Payer: Self-pay | Admitting: Internal Medicine

## 2024-02-10 DIAGNOSIS — J3089 Other allergic rhinitis: Secondary | ICD-10-CM

## 2024-03-07 ENCOUNTER — Other Ambulatory Visit: Payer: Self-pay | Admitting: Nurse Practitioner

## 2024-03-07 DIAGNOSIS — J383 Other diseases of vocal cords: Secondary | ICD-10-CM | POA: Diagnosis not present

## 2024-03-07 DIAGNOSIS — Z0001 Encounter for general adult medical examination with abnormal findings: Secondary | ICD-10-CM

## 2024-03-26 ENCOUNTER — Encounter: Payer: Self-pay | Admitting: Physician Assistant

## 2024-03-26 ENCOUNTER — Ambulatory Visit (INDEPENDENT_AMBULATORY_CARE_PROVIDER_SITE_OTHER): Payer: PPO | Admitting: Physician Assistant

## 2024-03-26 VITALS — BP 112/70 | HR 71 | Temp 98.0°F | Resp 16 | Ht 62.5 in | Wt 161.4 lb

## 2024-03-26 DIAGNOSIS — E782 Mixed hyperlipidemia: Secondary | ICD-10-CM

## 2024-03-26 DIAGNOSIS — J383 Other diseases of vocal cords: Secondary | ICD-10-CM | POA: Diagnosis not present

## 2024-03-26 DIAGNOSIS — R300 Dysuria: Secondary | ICD-10-CM

## 2024-03-26 DIAGNOSIS — R499 Unspecified voice and resonance disorder: Secondary | ICD-10-CM

## 2024-03-26 DIAGNOSIS — E039 Hypothyroidism, unspecified: Secondary | ICD-10-CM | POA: Diagnosis not present

## 2024-03-26 DIAGNOSIS — R5383 Other fatigue: Secondary | ICD-10-CM | POA: Diagnosis not present

## 2024-03-26 NOTE — Progress Notes (Signed)
 [facility] [address] [place], [place] [address]  Internal MEDICINE  Office Visit Note  Patient Name: Marissa Wilson  [id number]  [id number]  Date of Service: 03/26/2024  Chief Complaint  Patient presents with   Follow-up   Gastroesophageal Reflux   Hypertension   Hyperlipidemia    HPI Pt is here for routine follow up -Had a vocal cord cyst removed, and followed up with ENT and has another one right next to where previous one was. They started her on budesonide inhaler for now. Will go back on 8/29 to see if improving or if another procedure is needed to take this one out or not. Still hoarse/raspy voice due to this -has had vascular surgery on left leg and has had follow up with vascular and hematology. Decided to stay on xarelto given hx of blood clots -bone density reviewed--osteopenia overall stable from previous in 2021 -will reorder labs -would like to check urine, was seeing Dr. Penne but she has left and has not been to see her replacement yet. Does not have acute symptoms currently but would like it to be sent out to check given her hx of recurrent infections  Current Medication: Outpatient Encounter Medications as of 03/26/2024  Medication Sig   acetaminophen  (TYLENOL ) 650 MG CR tablet Take 650 mg every 6 (six) hours by mouth.   Budesonide 90 MCG/ACT inhaler Inhale 1 puff into the lungs 2 (two) times daily.   cetirizine  (ZYRTEC ) 10 MG tablet TAKE 1 TABLET (10 MG TOTAL) BY MOUTH DAILY. FOR ALLERGIES (NOT COVERED)   conjugated estrogens  (PREMARIN ) vaginal cream Place 1 Applicatorful vaginally daily. Use pea sized amount M-W-Fr before bedtime   furosemide  (LASIX ) 20 MG tablet TAKE 1 TABLET BY MOUTH EVERY DAY   levothyroxine (SYNTHROID) 75 MCG tablet TAKE 1 TABLET BY MOUTH EVERY DAY   montelukast  (SINGULAIR ) 10 MG tablet TAKE 1 TABLET BY MOUTH EVERYDAY AT BEDTIME   Multiple Vitamins-Minerals (VITAMIN-MINERAL SUPPLEMENT PO) Take by mouth daily. Mega D-3 &  MK-7 5000 IU/180MCG   pantoprazole (PROTONIX) 40 MG tablet Take 40 mg by mouth daily.   rivaroxaban (XARELTO) 10 MG TABS tablet Take 10 mg by mouth at bedtime.   [DISCONTINUED] omeprazole  (PRILOSEC) 40 MG capsule TAKE 1 CAPSULE (40 MG TOTAL) BY MOUTH DAILY.   No facility-administered encounter medications on file as of 03/26/2024.    Surgical History: Past Surgical History:  Procedure Laterality Date   ABDOMINAL HYSTERECTOMY     ABLATION SAPHENOUS VEIN W/ RFA Right 05/21/2021   COLONOSCOPY WITH PROPOFOL  N/A 06/20/2020   Procedure: COLONOSCOPY WITH PROPOFOL ;  Surgeon: Dessa Reyes ORN, MD;  Location: [facility] ENDOSCOPY;  Service: Endoscopy;  Laterality: N/A;   ESOPHAGOGASTRODUODENOSCOPY (EGD) WITH PROPOFOL  N/A 06/20/2020   Procedure: ESOPHAGOGASTRODUODENOSCOPY (EGD) WITH PROPOFOL ;  Surgeon: Dessa Reyes ORN, MD;  Location: [facility] ENDOSCOPY;  Service: Endoscopy;  Laterality: N/A;  COVID POSITIVE 05/19/20   PR ENDOVENOUS LASTER, 1ST VEIN Right 06/09/2017   Procedure: EVLA of Right GSV; Surgeon: Elsie Eveline Gulling, MD; Location: MAIN OR [facility]; Service: Vascular   PR LIGATN LONG SAPHENOUS VEIN AT SEPH-FEM JUNC Right 06/09/2017   Procedure: LIGATION & DIVISION OF LONG SAPHENOUS VEIN AT SAPHENOFEMORAL JUNCTION, OR DISTAL INTERRUPTIONS; Surgeon: Elsie Eveline Gulling, MD; Location: MAIN OR [facility]; Service: Vascular   PR PHLEB VEINS EXTREM TO 20 Right 06/09/2017   Procedure: STAB PHLEBECTOMY OF VARICOSE VEINS, 1 EXTREMITY: 10-20 STAB INCISIONS; Surgeon: Elsie Eveline Gulling, MD; Location: MAIN OR [facility]; Service: Vascular    Medical History:  Past Medical History:  Diagnosis Date   Asthma    Clotting disorder (HCC)    Gastrointestinal disorder    GERD (gastroesophageal reflux disease)    Hx of melanoma excision    Hyperlipidemia, unspecified    Hypertension    Hypothyroid    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   Osteoarthritis    Sleep apnea     Thyroid  disease    UTI (urinary tract infection)     Family History: Family History  Problem Relation Age of Onset   Breast cancer Mother 93   Varicose Veins Mother    Lung cancer Father    Heart attack Father    Bladder Cancer Neg Hx    Kidney cancer Neg Hx     Social History   Socioeconomic History   Marital status: Married    Spouse name: Bennet   Number of children: 2   Years of education: 13   Highest education level: Some college, no degree  Occupational History   Not on file  Tobacco Use   Smoking status: Never   Smokeless tobacco: Never  Vaping Use   Vaping status: Never Used  Substance and Sexual Activity   Alcohol use: Yes    Comment: occasional wine   Drug use: No   Sexual activity: Not on file  Other Topics Concern   Not on file  Social History Narrative   Not on file   Social Drivers of Health   Financial Resource Strain: Low Risk  (12/26/2023)   Received from [facility]   Overall Financial Resource Strain (CARDIA)    Difficulty of Paying Living Expenses: Not very hard  Food Insecurity: No Food Insecurity (12/28/2023)   Received from [facility]   Hunger Vital Sign    Within the past 12 months, you worried that your food would run out before you got the money to buy more.: Never true    Within the past 12 months, the food you bought just didn't last and you didn't have money to get more.: Never true  Transportation Needs: No Transportation Needs (12/28/2023)   Received from [facility]   PRAPARE - Transportation    Lack of Transportation (Medical): No    Lack of Transportation (Non-Medical): No  Physical Activity: Not on file  Stress: Not on file  Social Connections: Not on file  Intimate Partner Violence: Not on file      Review of Systems  Constitutional:  Negative for chills, fatigue and unexpected weight change.  HENT:  Positive for voice change. Negative for congestion, rhinorrhea, sneezing and sore throat.   Eyes:  Negative  for redness.  Respiratory:  Negative for cough, chest tightness and shortness of breath.   Cardiovascular:  Negative for chest pain and palpitations.  Gastrointestinal:  Negative for abdominal pain, constipation, diarrhea, nausea and vomiting.  Genitourinary:  Negative for dysuria and frequency.  Musculoskeletal:  Negative for arthralgias, back pain, joint swelling and neck pain.  Skin:  Negative for rash.  Neurological: Negative.  Negative for tremors and numbness.  Hematological:  Negative for adenopathy. Does not bruise/bleed easily.  Psychiatric/Behavioral:  Negative for behavioral problems (Depression), sleep disturbance and suicidal ideas. The patient is not nervous/anxious.     Vital Signs: BP 112/70   Pulse 71   Temp 98 F (36.7 C)   Resp 16   Ht 5' 2.5 (1.588 m)   Wt 161 lb 6.4 oz (73.2  kg)   SpO2 98%   BMI 29.05 kg/m    Physical Exam Vitals and nursing note reviewed.  Constitutional:      Appearance: Normal appearance.  HENT:     Head: Normocephalic and atraumatic.  Eyes:     Extraocular Movements: Extraocular movements intact.     Pupils: Pupils are equal, round, and reactive to light.  Cardiovascular:     Rate and Rhythm: Normal rate and regular rhythm.     Pulses: Normal pulses.     Heart sounds: Normal heart sounds.  Pulmonary:     Effort: Pulmonary effort is normal.     Breath sounds: Normal breath sounds.  Skin:    General: Skin is warm and dry.  Neurological:     General: No focal deficit present.     Mental Status: She is alert.  Psychiatric:        Mood and Affect: Mood normal.        Behavior: Behavior normal.        Assessment/Plan: 1. Hoarseness or changing voice (Primary) Secondary to another vocal cyst, followed by ENT  2. Vocal cord cyst Second cyst, followed by ENT with plan for follow up on 04/13/24  3. Hypothyroidism, unspecified type Will check labs and adjust synthroid as indicated - TSH + free T4  4. Mixed  hyperlipidemia - Lipid Panel With LDL/HDL Ratio  5. Other fatigue - Comprehensive metabolic panel with GFR - CBC w/Diff/Platelet - TSH + free T4 - Lipid Panel With LDL/HDL Ratio  6. Dysuria - UA/M w/rflx Culture, Routine   General Counseling: Janye verbalizes understanding of the findings of todays visit and agrees with plan of treatment. I have discussed any further diagnostic evaluation that may be needed or ordered today. We also reviewed her medications today. she has been encouraged to call the office with any questions or concerns that should arise related to todays visit.    Orders Placed This Encounter  Procedures   Comprehensive metabolic panel with GFR   CBC w/Diff/Platelet   TSH + free T4   Lipid Panel With LDL/HDL Ratio   UA/M w/rflx Culture, Routine    No orders of the defined types were placed in this encounter.   This patient was seen by Tinnie Pro, PA-C in collaboration with Dr. Sigrid Bathe as a part of collaborative care agreement.   Total time spent:30 Minutes Time spent includes review of chart, medications, test results, and follow up plan with the patient.      Dr Fozia M Khan Internal medicine

## 2024-03-28 ENCOUNTER — Ambulatory Visit: Payer: Self-pay | Admitting: Physician Assistant

## 2024-03-28 LAB — MICROSCOPIC EXAMINATION: Casts: NONE SEEN /LPF

## 2024-03-28 LAB — UA/M W/RFLX CULTURE, ROUTINE
Bilirubin, UA: NEGATIVE
Glucose, UA: NEGATIVE
Ketones, UA: NEGATIVE
Nitrite, UA: NEGATIVE
Protein,UA: NEGATIVE
RBC, UA: NEGATIVE
Specific Gravity, UA: 1.011 (ref 1.005–1.030)
Urobilinogen, Ur: 0.2 mg/dL (ref 0.2–1.0)
pH, UA: 7 (ref 5.0–7.5)

## 2024-03-28 LAB — URINE CULTURE, REFLEX

## 2024-03-28 NOTE — Telephone Encounter (Signed)
 Spoke with patient regarding urine culture that [facility] did not run. She will come in tomorrow to complete another sample.

## 2024-03-28 NOTE — Telephone Encounter (Signed)
-----   Message from Tinnie MARLA Pro sent at 03/28/2024 12:45 PM EDT ----- Please let her know that her urine culture could not be processed at the lab as several bacteria were found without any one predominant which could indicate some contamination. Would need a new urine  sample if still concerned for UTI evaluation ----- Message ----- From: Interface, [facility] Lab Results In Sent: 03/27/2024   7:36 AM EDT To: Tinnie MARLA Pro, PA-C

## 2024-03-29 ENCOUNTER — Ambulatory Visit

## 2024-03-29 DIAGNOSIS — N390 Urinary tract infection, site not specified: Secondary | ICD-10-CM | POA: Diagnosis not present

## 2024-03-30 DIAGNOSIS — E782 Mixed hyperlipidemia: Secondary | ICD-10-CM | POA: Diagnosis not present

## 2024-03-30 DIAGNOSIS — E039 Hypothyroidism, unspecified: Secondary | ICD-10-CM | POA: Diagnosis not present

## 2024-03-30 DIAGNOSIS — R5383 Other fatigue: Secondary | ICD-10-CM | POA: Diagnosis not present

## 2024-03-31 LAB — CBC WITH DIFFERENTIAL/PLATELET
Basophils Absolute: 0.1 x10E3/uL (ref 0.0–0.2)
Basos: 1 %
EOS (ABSOLUTE): 0.1 x10E3/uL (ref 0.0–0.4)
Eos: 2 %
Hematocrit: 44.2 % (ref 34.0–46.6)
Hemoglobin: 14.1 g/dL (ref 11.1–15.9)
Immature Grans (Abs): 0 x10E3/uL (ref 0.0–0.1)
Immature Granulocytes: 0 %
Lymphocytes Absolute: 1.8 x10E3/uL (ref 0.7–3.1)
Lymphs: 33 %
MCH: 30.7 pg (ref 26.6–33.0)
MCHC: 31.9 g/dL (ref 31.5–35.7)
MCV: 96 fL (ref 79–97)
Monocytes Absolute: 0.6 x10E3/uL (ref 0.1–0.9)
Monocytes: 10 %
Neutrophils Absolute: 2.9 x10E3/uL (ref 1.4–7.0)
Neutrophils: 54 %
Platelets: 239 x10E3/uL (ref 150–450)
RBC: 4.59 x10E6/uL (ref 3.77–5.28)
RDW: 12.6 % (ref 11.7–15.4)
WBC: 5.5 x10E3/uL (ref 3.4–10.8)

## 2024-03-31 LAB — COMPREHENSIVE METABOLIC PANEL WITH GFR
ALT: 14 IU/L (ref 0–32)
AST: 18 IU/L (ref 0–40)
Albumin: 4.2 g/dL (ref 3.8–4.8)
Alkaline Phosphatase: 90 IU/L (ref 44–121)
BUN/Creatinine Ratio: 17 (ref 12–28)
BUN: 12 mg/dL (ref 8–27)
Bilirubin Total: 0.7 mg/dL (ref 0.0–1.2)
CO2: 22 mmol/L (ref 20–29)
Calcium: 9.4 mg/dL (ref 8.7–10.3)
Chloride: 102 mmol/L (ref 96–106)
Creatinine, Ser: 0.71 mg/dL (ref 0.57–1.00)
Globulin, Total: 2.1 g/dL (ref 1.5–4.5)
Glucose: 84 mg/dL (ref 70–99)
Potassium: 4.4 mmol/L (ref 3.5–5.2)
Sodium: 141 mmol/L (ref 134–144)
Total Protein: 6.3 g/dL (ref 6.0–8.5)
eGFR: 87 mL/min/1.73 (ref >59)

## 2024-03-31 LAB — LIPID PANEL WITH LDL/HDL RATIO
Cholesterol, Total: 204 mg/dL — ABNORMAL HIGH (ref 100–199)
HDL: 54 mg/dL (ref >39)
LDL Chol Calc (NIH): 130 mg/dL — ABNORMAL HIGH (ref 0–99)
LDL/HDL Ratio: 2.4 ratio (ref 0.0–3.2)
Triglycerides: 110 mg/dL (ref 0–149)
VLDL Cholesterol Cal: 20 mg/dL (ref 5–40)

## 2024-03-31 LAB — URINE CULTURE

## 2024-03-31 LAB — TSH+FREE T4
Free T4: 1.5 ng/dL (ref 0.82–1.77)
TSH: 1.57 u[IU]/mL (ref 0.450–4.500)

## 2024-04-13 DIAGNOSIS — J387 Other diseases of larynx: Secondary | ICD-10-CM | POA: Diagnosis not present

## 2024-04-18 ENCOUNTER — Ambulatory Visit: Payer: PPO | Admitting: Dermatology

## 2024-04-19 NOTE — Telephone Encounter (Signed)
-----   Message from Tinnie MARLA Pro sent at 04/18/2024  4:24 PM EDT ----- Please let her know that her cholesterol is a little elevated, but it is improving and should continue to work on this, otherwise labs looked good ----- Message ----- From: Rebecka, [facility] Lab Results In Sent: 03/27/2024   7:36 AM EDT To: Tinnie MARLA Pro, PA-C

## 2024-04-19 NOTE — Telephone Encounter (Signed)
 Spoke with patient regarding labs. Scheduled patient to see [facility] for GI referral, per patient request.

## 2024-05-22 ENCOUNTER — Encounter: Payer: Self-pay | Admitting: Internal Medicine

## 2024-05-22 ENCOUNTER — Ambulatory Visit: Admitting: Internal Medicine

## 2024-05-22 VITALS — BP 134/65 | HR 61 | Temp 98.2°F | Resp 16 | Ht 62.5 in | Wt 162.0 lb

## 2024-05-22 DIAGNOSIS — Z1231 Encounter for screening mammogram for malignant neoplasm of breast: Secondary | ICD-10-CM

## 2024-05-22 DIAGNOSIS — K219 Gastro-esophageal reflux disease without esophagitis: Secondary | ICD-10-CM

## 2024-05-22 DIAGNOSIS — N390 Urinary tract infection, site not specified: Secondary | ICD-10-CM

## 2024-05-22 DIAGNOSIS — J383 Other diseases of vocal cords: Secondary | ICD-10-CM | POA: Diagnosis not present

## 2024-05-22 DIAGNOSIS — D692 Other nonthrombocytopenic purpura: Secondary | ICD-10-CM

## 2024-05-22 LAB — POCT URINALYSIS DIPSTICK
Bilirubin, UA: NEGATIVE
Blood, UA: NEGATIVE
Glucose, UA: NEGATIVE
Ketones, UA: POSITIVE
Nitrite, UA: NEGATIVE
Protein, UA: NEGATIVE
Spec Grav, UA: 1.01 (ref 1.010–1.025)
Urobilinogen, UA: 0.2 U/dL
pH, UA: 5 (ref 5.0–8.0)

## 2024-05-22 NOTE — Progress Notes (Unsigned)
 [facility] [address] [place], [place] [address]  Internal MEDICINE  Office Visit Note  Patient Name: Marissa Wilson  [id number]  [id number]  Date of Service: 05/23/2024  Chief Complaint  Patient presents with   Acute Visit   Referral    GI issues - mostly GERD    HPI Pt is seen for routine follow up Has been seen by ENT for ongoing post nasal drip She had direct laryngoscopy and was found to have 1.0 x 1.0 x 1.2 cm enhancing lesion centered in the left false vocal cord, which appears to abut the left true vocal cord abutting the medial margin of the thyroid  cartilage without evidence of erosion. No evidence of metastatic disease Diagnosis:   Left false vocal fold lesion Treatment/Date Completed:   01/10/24 Microlaryngoscopy with removal of lesions,Use of CO2 laser for removal of lesions (fragmented and disrupted benign oncocytic cyst, negative for malignancy) Continued f/u with ENT  2. Heartburn is getting worse, postnasal drip still bothers her. She was unable to see GI due to being busy with ENT, will like to see one   3. Continues to take all her medications  4. She will like to see urology for recurrent UTI   Current Medication: Current Outpatient Medications on File Prior to Visit  Medication Sig Dispense Refill   acetaminophen  (TYLENOL ) 650 MG CR tablet Take 650 mg every 6 (six) hours by mouth.     cetirizine  (ZYRTEC ) 10 MG tablet TAKE 1 TABLET (10 MG TOTAL) BY MOUTH DAILY. FOR ALLERGIES (NOT COVERED) 90 tablet 3   furosemide  (LASIX ) 20 MG tablet TAKE 1 TABLET BY MOUTH EVERY DAY 90 tablet 1   levothyroxine (SYNTHROID) 75 MCG tablet TAKE 1 TABLET BY MOUTH EVERY DAY 90 tablet 1   montelukast  (SINGULAIR ) 10 MG tablet TAKE 1 TABLET BY MOUTH EVERYDAY AT BEDTIME 90 tablet 1   Multiple Vitamins-Minerals (VITAMIN-MINERAL SUPPLEMENT PO) Take by mouth daily. Mega D-3 & MK-7 5000 IU/180MCG     rivaroxaban (XARELTO) 10 MG TABS tablet Take 10 mg by mouth at  bedtime.     No current facility-administered medications on file prior to visit.      Surgical History: Past Surgical History:  Procedure Laterality Date   ABDOMINAL HYSTERECTOMY     ABLATION SAPHENOUS VEIN W/ RFA Right 05/21/2021   COLONOSCOPY WITH PROPOFOL  N/A 06/20/2020   Procedure: COLONOSCOPY WITH PROPOFOL ;  Surgeon: Dessa Reyes ORN, MD;  Location: [facility] ENDOSCOPY;  Service: Endoscopy;  Laterality: N/A;   ESOPHAGOGASTRODUODENOSCOPY (EGD) WITH PROPOFOL  N/A 06/20/2020   Procedure: ESOPHAGOGASTRODUODENOSCOPY (EGD) WITH PROPOFOL ;  Surgeon: Dessa Reyes ORN, MD;  Location: [facility] ENDOSCOPY;  Service: Endoscopy;  Laterality: N/A;  COVID POSITIVE 05/19/20   PR ENDOVENOUS LASTER, 1ST VEIN Right 06/09/2017   Procedure: EVLA of Right GSV; Surgeon: Elsie Eveline Gulling, MD; Location: MAIN OR [facility]; Service: Vascular   PR LIGATN LONG SAPHENOUS VEIN AT SEPH-FEM JUNC Right 06/09/2017   Procedure: LIGATION & DIVISION OF LONG SAPHENOUS VEIN AT SAPHENOFEMORAL JUNCTION, OR DISTAL INTERRUPTIONS; Surgeon: Elsie Eveline Gulling, MD; Location: MAIN OR [facility]; Service: Vascular   PR PHLEB VEINS EXTREM TO 20 Right 06/09/2017   Procedure: STAB PHLEBECTOMY OF VARICOSE VEINS, 1 EXTREMITY: 10-20 STAB INCISIONS; Surgeon: Elsie Eveline Gulling, MD; Location: MAIN OR [facility]; Service: Vascular    Medical History: Past Medical History:  Diagnosis Date   Asthma    Clotting disorder    Gastrointestinal disorder    GERD (gastroesophageal reflux disease)    Hx of melanoma  excision    Hyperlipidemia, unspecified    Hypertension    Hypothyroid    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   Osteoarthritis    Sleep apnea    Thyroid  disease    UTI (urinary tract infection)     Family History: Family History  Problem Relation Age of Onset   Breast cancer Mother 65   Varicose Veins Mother    Lung cancer Father    Heart attack Father    Bladder Cancer Neg Hx    Kidney  cancer Neg Hx     Social History   Socioeconomic History   Marital status: Married    Spouse name: Bennet   Number of children: 2   Years of education: 13   Highest education level: Some college, no degree  Occupational History   Not on file  Tobacco Use   Smoking status: Never   Smokeless tobacco: Never  Vaping Use   Vaping status: Never Used  Substance and Sexual Activity   Alcohol use: Yes    Comment: occasional wine   Drug use: No   Sexual activity: Not on file  Other Topics Concern   Not on file  Social History Narrative   Not on file   Social Drivers of Health   Financial Resource Strain: Low Risk  (12/26/2023)   Received [facility]   Overall Financial Resource Strain (CARDIA)    Difficulty of Paying Living Expenses: Not very hard  Food Insecurity: No Food Insecurity (12/28/2023)   Received from [facility]   Hunger Vital Sign    Within the past 12 months, you worried that your food would run out before you got the money to buy more.: Never true    Within the past 12 months, the food you bought just didn't last and you didn't have money to get more.: Never true  Transportation Needs: No Transportation Needs (12/28/2023)   Received from [facility]   PRAPARE - Transportation    Lack of Transportation (Medical): No    Lack of Transportation (Non-Medical): No  Physical Activity: Not on file  Stress: Not on file  Social Connections: Not on file  Intimate Partner Violence: Not on file      Review of Systems  Constitutional:  Negative for fatigue and fever.  HENT:  Positive for postnasal drip. Negative for congestion and mouth sores.   Respiratory:  Negative for cough.   Cardiovascular:  Negative for chest pain.  Gastrointestinal:        Reflux   Genitourinary:  Negative for flank pain.  Psychiatric/Behavioral: Negative.      Vital Signs: BP 134/65   Pulse 61   Temp 98.2 F (36.8 C)   Resp 16   Ht 5' 2.5 (1.588 m)   Wt 162 lb (73.5  kg)   SpO2 95%   BMI 29.16 kg/m    Physical Exam Constitutional:      Appearance: Normal appearance.  HENT:     Head: Normocephalic and atraumatic.     Nose: Nose normal.     Mouth/Throat:     Mouth: Mucous membranes are moist.     Pharynx: No posterior oropharyngeal erythema.  Eyes:     Extraocular Movements: Extraocular movements intact.     Pupils: Pupils are equal, round, and reactive to light.  Cardiovascular:     Pulses: Normal pulses.     Heart sounds: Normal heart sounds.  Pulmonary:  Effort: Pulmonary effort is normal.     Breath sounds: Normal breath sounds.  Skin:    Findings: Bruising present.  Neurological:     General: No focal deficit present.     Mental Status: She is alert.  Psychiatric:        Mood and Affect: Mood normal.        Behavior: Behavior normal.     Assessment/Plan: 1. 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 Recurrent UTI - Ambulatory referral to Urology - POCT Urinalysis Dipstick - CULTURE, URINE COMPREHENSIVE 4. Encounter for screening mammogram for malignant neoplasm of breast - MM 3D SCREENING MAMMOGRAM BILATERAL BREAST; Future  5. Vocal cord cyst Followed by ENT   General Counseling: Joen oakland understanding of the findings of todays visit and agrees with plan of treatment. I have discussed any further diagnostic evaluation that may be needed or ordered today. We also reviewed her medications today. she has been encouraged to call the office with any questions or concerns that should arise related to todays visit.    Orders Placed This Encounter  Procedures   CULTURE, URINE COMPREHENSIVE   MM 3D SCREENING MAMMOGRAM BILATERAL BREAST   Ambulatory referral to Gastroenterology   Ambulatory referral to Urology   POCT Urinalysis Dipstick    No orders of the defined types were placed in this  encounter.   Total time spent:35 Minutes Time spent includes review of chart, medications, test results, and follow up plan with the patient.   [place] Controlled Substance Database was reviewed by me.   Dr Lydia Toren M Rhonin Trott Internal medicine

## 2024-05-23 ENCOUNTER — Telehealth: Payer: Self-pay | Admitting: Internal Medicine

## 2024-05-23 NOTE — Telephone Encounter (Signed)
 Awaiting 05/22/24 office notes for GI & Urology referral-Marissa Wilson

## 2024-05-25 ENCOUNTER — Telehealth: Payer: Self-pay | Admitting: Internal Medicine

## 2024-05-25 LAB — CULTURE, URINE COMPREHENSIVE

## 2024-05-25 NOTE — Telephone Encounter (Signed)
 Urology referral faxed to the [facility] in [place]; [phone]. Notified patient.   Gastroenterology referral sent via [facility] to [facility]. Gave patient telephone # [phone]

## 2024-05-28 ENCOUNTER — Other Ambulatory Visit: Payer: Self-pay | Admitting: Internal Medicine

## 2024-05-28 ENCOUNTER — Telehealth: Payer: Self-pay

## 2024-05-28 MED ORDER — DOXYCYCLINE HYCLATE 100 MG PO TABS: ORAL_TABLET | ORAL | 0 refills | Status: AC

## 2024-05-28 NOTE — Telephone Encounter (Signed)
 Pt advised we sent  doxycycline  for 5 days

## 2024-06-04 ENCOUNTER — Telehealth: Payer: Self-pay | Admitting: Internal Medicine

## 2024-06-04 NOTE — Telephone Encounter (Signed)
 Gastroenterology appointment 10/29/2024 with [facility]

## 2024-06-26 DIAGNOSIS — H25813 Combined forms of age-related cataract, bilateral: Secondary | ICD-10-CM | POA: Diagnosis not present

## 2024-06-28 ENCOUNTER — Other Ambulatory Visit: Payer: Self-pay

## 2024-06-28 MED ORDER — LEVOTHYROXINE SODIUM 75 MCG PO TABS: 75.0000 ug | ORAL_TABLET | Freq: Every day | ORAL | 1 refills | Status: AC

## 2024-07-23 DIAGNOSIS — R059 Cough, unspecified: Secondary | ICD-10-CM | POA: Diagnosis not present

## 2024-07-23 DIAGNOSIS — K219 Gastro-esophageal reflux disease without esophagitis: Secondary | ICD-10-CM | POA: Diagnosis not present

## 2024-07-23 DIAGNOSIS — R490 Dysphonia: Secondary | ICD-10-CM | POA: Diagnosis not present

## 2024-07-23 DIAGNOSIS — D141 Benign neoplasm of larynx: Secondary | ICD-10-CM | POA: Diagnosis not present

## 2024-07-25 ENCOUNTER — Encounter

## 2024-08-03 ENCOUNTER — Other Ambulatory Visit: Payer: Self-pay | Admitting: Internal Medicine

## 2024-08-03 DIAGNOSIS — J3089 Other allergic rhinitis: Secondary | ICD-10-CM

## 2024-08-05 ENCOUNTER — Encounter: Payer: Self-pay | Admitting: Internal Medicine

## 2024-08-07 ENCOUNTER — Ambulatory Visit
Admission: RE | Admit: 2024-08-07 | Discharge: 2024-08-07 | Disposition: A | Discharge status: Home / Self Care | Source: Ambulatory Visit | Attending: Internal Medicine | Admitting: Internal Medicine

## 2024-08-07 DIAGNOSIS — Z1231 Encounter for screening mammogram for malignant neoplasm of breast: Secondary | ICD-10-CM | POA: Diagnosis present

## 2024-10-02 ENCOUNTER — Ambulatory Visit: Payer: PPO | Admitting: Internal Medicine
